# Patient Record
Sex: Female | Born: 1955 | Race: White | Hispanic: No | Smoking: Never smoker
Health system: Southern US, Community
[De-identification: ages and names within clinical notes are randomized; demographics above are authoritative.]

## PROBLEM LIST (undated history)

## (undated) DIAGNOSIS — J45909 Unspecified asthma, uncomplicated: Secondary | ICD-10-CM

## (undated) DIAGNOSIS — H04129 Dry eye syndrome of unspecified lacrimal gland: Secondary | ICD-10-CM

## (undated) DIAGNOSIS — H269 Unspecified cataract: Secondary | ICD-10-CM

## (undated) DIAGNOSIS — G629 Polyneuropathy, unspecified: Secondary | ICD-10-CM

## (undated) DIAGNOSIS — M810 Age-related osteoporosis without current pathological fracture: Secondary | ICD-10-CM

## (undated) DIAGNOSIS — I4891 Unspecified atrial fibrillation: Secondary | ICD-10-CM

## (undated) DIAGNOSIS — Z9989 Dependence on other enabling machines and devices: Secondary | ICD-10-CM

## (undated) DIAGNOSIS — K219 Gastro-esophageal reflux disease without esophagitis: Secondary | ICD-10-CM

## (undated) DIAGNOSIS — A6920 Lyme disease, unspecified: Secondary | ICD-10-CM

## (undated) DIAGNOSIS — T7840XA Allergy, unspecified, initial encounter: Secondary | ICD-10-CM

## (undated) DIAGNOSIS — G4730 Sleep apnea, unspecified: Secondary | ICD-10-CM

## (undated) DIAGNOSIS — G709 Myoneural disorder, unspecified: Secondary | ICD-10-CM

## (undated) DIAGNOSIS — R5383 Other fatigue: Secondary | ICD-10-CM

## (undated) DIAGNOSIS — F419 Anxiety disorder, unspecified: Secondary | ICD-10-CM

## (undated) DIAGNOSIS — M1990 Unspecified osteoarthritis, unspecified site: Secondary | ICD-10-CM

## (undated) HISTORY — DX: Unspecified osteoarthritis, unspecified site: M19.90

## (undated) HISTORY — DX: Sleep apnea, unspecified: G47.30

## (undated) HISTORY — DX: Lyme disease, unspecified: A69.20

## (undated) HISTORY — DX: Unspecified cataract: H26.9

## (undated) HISTORY — DX: Other fatigue: R53.83

## (undated) HISTORY — DX: Dependence on other enabling machines and devices: Z99.89

## (undated) HISTORY — DX: Unspecified asthma, uncomplicated: J45.909

## (undated) HISTORY — DX: Polyneuropathy, unspecified: G62.9

## (undated) HISTORY — DX: Dry eye syndrome of unspecified lacrimal gland: H04.129

## (undated) HISTORY — PX: RIGHT AND LEFT HEART CATH: CATH118262

## (undated) HISTORY — DX: Anxiety disorder, unspecified: F41.9

## (undated) HISTORY — DX: Unspecified atrial fibrillation: I48.91

## (undated) HISTORY — DX: Myoneural disorder, unspecified: G70.9

## (undated) HISTORY — DX: Gastro-esophageal reflux disease without esophagitis: K21.9

## (undated) HISTORY — DX: Allergy, unspecified, initial encounter: T78.40XA

## (undated) HISTORY — DX: Age-related osteoporosis without current pathological fracture: M81.0

## (undated) HISTORY — PX: ABDOMINAL HYSTERECTOMY: SHX81

## (undated) HISTORY — PX: EYE SURGERY: SHX253

## (undated) HISTORY — PX: TUBAL LIGATION: SHX77

## (undated) HISTORY — PX: ESOPHAGOGASTRODUODENOSCOPY: SHX1529

## (undated) HISTORY — PX: COLONOSCOPY: SHX174

## (undated) HISTORY — PX: BREAST SURGERY: SHX581

---

## 2016-05-06 DIAGNOSIS — I480 Paroxysmal atrial fibrillation: Secondary | ICD-10-CM | POA: Insufficient documentation

## 2021-10-13 ENCOUNTER — Encounter: Payer: Self-pay | Admitting: Nurse Practitioner

## 2021-10-13 ENCOUNTER — Ambulatory Visit (INDEPENDENT_AMBULATORY_CARE_PROVIDER_SITE_OTHER): Payer: Medicare Other | Admitting: Nurse Practitioner

## 2021-10-13 VITALS — BP 104/63 | HR 77 | Temp 98.0°F | Resp 20 | Wt 120.0 lb

## 2021-10-13 DIAGNOSIS — K2100 Gastro-esophageal reflux disease with esophagitis, without bleeding: Secondary | ICD-10-CM | POA: Diagnosis not present

## 2021-10-13 DIAGNOSIS — Z7689 Persons encountering health services in other specified circumstances: Secondary | ICD-10-CM | POA: Diagnosis not present

## 2021-10-13 DIAGNOSIS — M19042 Primary osteoarthritis, left hand: Secondary | ICD-10-CM

## 2021-10-13 DIAGNOSIS — J4520 Mild intermittent asthma, uncomplicated: Secondary | ICD-10-CM | POA: Diagnosis not present

## 2021-10-13 DIAGNOSIS — M19041 Primary osteoarthritis, right hand: Secondary | ICD-10-CM

## 2021-10-13 MED ORDER — MONTELUKAST SODIUM 10 MG PO TABS: 10.0000 mg | ORAL_TABLET | Freq: Every day | ORAL | 2 refills | Status: DC

## 2021-10-13 MED ORDER — PANTOPRAZOLE SODIUM 40 MG PO TBEC: 40.0000 mg | DELAYED_RELEASE_TABLET | Freq: Every day | ORAL | 2 refills | Status: DC

## 2021-10-13 NOTE — Assessment & Plan Note (Signed)
Patient is new establishing care today.  Recently moved down from [place].  Provided education on health maintenance and preventative care.  Patient will return for complete physical labs and mammogram in the next 3 months.  Referral to neurology and rheumatology per patient request completed.  Advised patient to fax or medical information and specialty areas to clinic.  Patient verbalized understanding.

## 2021-10-13 NOTE — Assessment & Plan Note (Signed)
Patient's asthma is well controlled on albuterol no changes necessary.  Rx refill sent to pharmacy.  Follow-up as needed.

## 2021-10-13 NOTE — Patient Instructions (Signed)
Asthma, Adult °Asthma is a long-term (chronic) condition that causes recurrent episodes in which the airways become tight and narrow. The airways are the passages that lead from the nose and mouth down into the lungs. Asthma episodes, also called asthma attacks, can cause coughing, wheezing, shortness of breath, and chest pain. The airways can also fill with mucus. During an attack, it can be difficult to breathe. Asthma attacks can range from minor to life threatening. °Asthma cannot be cured, but medicines and lifestyle changes can help control it and treat acute attacks. °What are the causes? °This condition is believed to be caused by inherited (genetic) and environmental factors, but its exact cause is not known. °There are many things that can bring on an asthma attack or make asthma symptoms worse (triggers). Asthma triggers are different for each person. Common triggers include: °Mold. °Dust. °Cigarette smoke. °Cockroaches. °Things that can cause allergy symptoms (allergens), such as animal dander or pollen from trees or grass. °Air pollutants such as household cleaners, wood smoke, smog, or chemical odors. °Cold air, weather changes, and winds (which increase molds and pollen in the air). °Strong emotional expressions such as crying or laughing hard. °Stress. °Certain medicines (such as aspirin) or types of medicines (such as beta-blockers). °Sulfites in foods and drinks. Foods and drinks that may contain sulfites include dried fruit, potato chips, and sparkling grape juice. °Infections or inflammatory conditions such as the flu, a cold, or inflammation of the nasal membranes (rhinitis). °Gastroesophageal reflux disease (GERD). °Exercise or strenuous activity. °What are the signs or symptoms? °Symptoms of this condition may occur right after asthma is triggered or many hours later. Symptoms include: °Wheezing. This can sound like whistling when you breathe. °Excessive nighttime or early morning  coughing. °Frequent or severe coughing with a common cold. °Chest tightness. °Shortness of breath. °Tiredness (fatigue) with minimal activity. °How is this diagnosed? °This condition is diagnosed based on: °Your medical history. °A physical exam. °Tests, which may include: °Lung function studies and pulmonary studies (spirometry). These tests can evaluate the flow of air in your lungs. °Allergy tests. °Imaging tests, such as X-rays. °How is this treated? °There is no cure for this condition, but treatment can help control your symptoms. Treatment for asthma usually involves: °Identifying and avoiding your asthma triggers. °Using medicines to control your symptoms. Generally, two types of medicines are used to treat asthma: °Controller medicines. These help prevent asthma symptoms from occurring. They are usually taken every day. °Fast-acting reliever or rescue medicines. These quickly relieve asthma symptoms by widening the narrow and tight airways. They are used as needed and provide short-term relief. °Using supplemental oxygen. This may be needed during a severe episode. °Using other medicines, such as: °Allergy medicines, such as antihistamines, if your asthma attacks are triggered by allergens. °Immune medicines (immunomodulators). These are medicines that help control the immune system. °Creating an asthma action plan. An asthma action plan is a written plan for managing and treating your asthma attacks. This plan includes: °A list of your asthma triggers and how to avoid them. °Information about when medicines should be taken and when their dosage should be changed. °Instructions about using a device called a peak flow meter. A peak flow meter measures how well the lungs are working and the severity of your asthma. It helps you monitor your condition. °Follow these instructions at home: °Controlling your home environment °Control your home environment in the following ways to help avoid triggers and prevent  asthma attacks: °Change your heating   and air conditioning filter regularly. °Limit your use of fireplaces and wood stoves. °Get rid of pests (such as roaches and mice) and their droppings. °Throw away plants if you see mold on them. °Clean floors and dust surfaces regularly. Use unscented cleaning products. °Try to have someone else vacuum for you regularly. Stay out of rooms while they are being vacuumed and for a short while afterward. If you vacuum, use a dust mask from a hardware store, a double-layered or microfilter vacuum cleaner bag, or a vacuum cleaner with a HEPA filter. °Replace carpet with wood, tile, or vinyl flooring. Carpet can trap dander and dust. °Use allergy-proof pillows, mattress covers, and box spring covers. °Keep your bedroom a trigger-free room. °Avoid pets and keep windows closed when allergens are in the air. °Wash beddings every week in hot water and dry them in a dryer. °Use blankets that are made of polyester or cotton. °Clean bathrooms and kitchens with bleach. If possible, have someone repaint the walls in these rooms with mold-resistant paint. Stay out of the rooms that are being cleaned and painted. °Wash your hands often with soap and water. If soap and water are not available, use hand sanitizer. °Do not allow anyone to smoke in your home. °General instructions °Take over-the-counter and prescription medicines only as told by your health care provider. °Speak with your health care provider if you have questions about how or when to take the medicines. °Make note if you are requiring more frequent dosages. °Do not use any products that contain nicotine or tobacco, such as cigarettes and e-cigarettes. If you need help quitting, ask your health care provider. Also, avoid being exposed to secondhand smoke. °Use a peak flow meter as told by your health care provider. Record and keep track of the readings. °Understand and use the asthma action plan to help minimize, or stop an asthma  attack, without needing to seek medical care. °Make sure you stay up to date on your yearly vaccinations as told by your health care provider. This may include vaccines for the flu and pneumonia. °Avoid outdoor activities when allergen counts are high and when air quality is low. °Wear a ski mask that covers your nose and mouth during outdoor winter activities. Exercise indoors on cold days if you can. °Warm up before exercising, and take time for a cool-down period after exercise. °Keep all follow-up visits as told by your health care provider. This is important. °Where to find more information °For information about asthma, turn to the Centers for Disease Control and Prevention at www.cdc.gov/asthma/faqs °For air quality information, turn to AirNow at airnow.gov °Contact a health care provider if: °You have wheezing, shortness of breath, or a cough even while you are taking medicine to prevent attacks. °The mucus you cough up (sputum) is thicker than usual. °Your sputum changes from clear or white to yellow, green, gray, or bloody. °Your medicines are causing side effects, such as a rash, itching, swelling, or trouble breathing. °You need to use a reliever medicine more than 2-3 times a week. °Your peak flow reading is still at 50-79% of your personal best after following your action plan for 1 hour. °You have a fever. °Get help right away if: °You are getting worse and do not respond to treatment during an asthma attack. °You are short of breath when at rest or when doing very little physical activity. °You have difficulty eating, drinking, or talking. °You have chest pain or tightness. °You develop a fast heartbeat or   palpitations. You have a bluish color to your lips or fingernails. You are light-headed or dizzy, or you faint. Your peak flow reading is less than 50% of your personal best. You feel too tired to breathe normally. Summary Asthma is a long-term (chronic) condition that causes recurrent  episodes in which the airways become tight and narrow. These episodes can cause coughing, wheezing, shortness of breath, and chest pain. Asthma cannot be cured, but medicines and lifestyle changes can help control it and treat acute attacks. Make sure you understand how to avoid triggers and how and when to use your medicines. Asthma attacks can range from minor to life threatening. Get help right away if you have an asthma attack and do not respond to treatment with your usual rescue medicines. This information is not intended to replace advice given to you by your health care provider. Make sure you discuss any questions you have with your health care provider. Document Revised: 07/19/2020 Document Reviewed: 02/21/2020 Elsevier Patient Education  [place]. Gastroesophageal Reflux Disease, Adult Gastroesophageal reflux (GER) happens when acid from the stomach flows up into the tube that connects the mouth and the stomach (esophagus). Normally, food travels down the esophagus and stays in the stomach to be digested. With GER, food and stomach acid sometimes move back up into the esophagus. You may have a disease called gastroesophageal reflux disease (GERD) if the reflux: Happens often. Causes frequent or very bad symptoms. Causes problems such as damage to the esophagus. When this happens, the esophagus becomes sore and swollen. Over time, GERD can make small holes (ulcers) in the lining of the esophagus. What are the causes? This condition is caused by a problem with the muscle between the esophagus and the stomach. When this muscle is weak or not normal, it does not close properly to keep food and acid from coming back up from the stomach. The muscle can be weak because of: Tobacco use. Pregnancy. Having a certain type of hernia (hiatal hernia). Alcohol use. Certain foods and drinks, such as coffee, chocolate, onions, and peppermint. What increases the risk? Being  overweight. Having a disease that affects your connective tissue. Taking NSAIDs, such a ibuprofen. What are the signs or symptoms? Heartburn. Difficult or painful swallowing. The feeling of having a lump in the throat. A bitter taste in the mouth. Bad breath. Having a lot of saliva. Having an upset or bloated stomach. Burping. Chest pain. Different conditions can cause chest pain. Make sure you see your doctor if you have chest pain. Shortness of breath or wheezing. A long-term cough or a cough at night. Wearing away of the surface of teeth (tooth enamel). Weight loss. How is this treated? Making changes to your diet. Taking medicine. Having surgery. Treatment will depend on how bad your symptoms are. Follow these instructions at home: Eating and drinking  Follow a diet as told by your doctor. You may need to avoid foods and drinks such as: Coffee and tea, with or without caffeine. Drinks that contain alcohol. Energy drinks and sports drinks. Bubbly (carbonated) drinks or sodas. Chocolate and cocoa. Peppermint and mint flavorings. Garlic and onions. Horseradish. Spicy and acidic foods. These include peppers, chili powder, curry powder, vinegar, hot sauces, and BBQ sauce. Citrus fruit juices and citrus fruits, such as oranges, lemons, and limes. Tomato-based foods. These include red sauce, chili, salsa, and pizza with red sauce. Fried and fatty foods. These include donuts, french fries, potato chips, and high-fat dressings. High-fat meats. These include  hot dogs, rib eye steak, sausage, ham, and bacon. High-fat dairy items, such as whole milk, butter, and cream cheese. Eat small meals often. Avoid eating large meals. Avoid drinking large amounts of liquid with your meals. Avoid eating meals during the 2-3 hours before bedtime. Avoid lying down right after you eat. Do not exercise right after you eat. Lifestyle  Do not smoke or use any products that contain nicotine or  tobacco. If you need help quitting, ask your doctor. Try to lower your stress. If you need help doing this, ask your doctor. If you are overweight, lose an amount of weight that is healthy for you. Ask your doctor about a safe weight loss goal. General instructions Pay attention to any changes in your symptoms. Take over-the-counter and prescription medicines only as told by your doctor. Do not take aspirin, ibuprofen, or other NSAIDs unless your doctor says it is okay. Wear loose clothes. Do not wear anything tight around your waist. Raise (elevate) the head of your bed about 6 inches (15 cm). You may need to use a wedge to do this. Avoid bending over if this makes your symptoms worse. Keep all follow-up visits. Contact a doctor if: You have new symptoms. You lose weight and you do not know why. You have trouble swallowing or it hurts to swallow. You have wheezing or a cough that keeps happening. You have a hoarse voice. Your symptoms do not get better with treatment. Get help right away if: You have sudden pain in your arms, neck, jaw, teeth, or back. You suddenly feel sweaty, dizzy, or light-headed. You have chest pain or shortness of breath. You vomit and the vomit is green, yellow, or black, or it looks like blood or coffee grounds. You faint. Your poop (stool) is red, bloody, or black. You cannot swallow, drink, or eat. These symptoms may represent a serious problem that is an emergency. Do not wait to see if the symptoms will go away. Get medical help right away. Call your local emergency services (911 in the [place] Do not drive yourself to the hospital. Summary If a person has gastroesophageal reflux disease (GERD), food and stomach acid move back up into the esophagus and cause symptoms or problems such as damage to the esophagus. Treatment will depend on how bad your symptoms are. Follow a diet as told by your doctor. Take all medicines only as told by your doctor. This  information is not intended to replace advice given to you by your health care provider. Make sure you discuss any questions you have with your health care provider. Document Revised: 04/29/2020 Document Reviewed: 04/29/2020 Elsevier Patient Education  [place].

## 2021-10-13 NOTE — Assessment & Plan Note (Signed)
GERD symptoms well managed on Protonix 40 mg tablet by mouth daily.  No changes to dose Rx refill sent to pharmacy.

## 2021-10-13 NOTE — Progress Notes (Signed)
New Patient Note  RE: Kristin Wallace MRN: [id number] DOB: 1956-05-02 Date of Office Visit: 10/13/2021  Chief Complaint: Establish Care (NEW //), Gastroesophageal Reflux, and Asthma  History of Present Illness:   GERD, Follow up:  The patient was last seen for GERD.  months ago. Changes made since that visit include Protonix 40 mg tablet by mouth daily..  She reports good compliance with treatment. She is not having side effects. .  She IS experiencing heartburn. She is NOT experiencing dysphagia, early satiety, or fullness after meals   Asthma Follow-up: She has previously been evaluated by a different physician in [place] for asthma and presents for an asthma follow-up; she is not currently in exacerbation. Observed precipitants include no identifiable factor.  Current limitations in activity from asthma: none.  Number of days of school or work missed in the last month: not applicable. Number of Emergency Department visits in the previous month: none. Frequency of use of quick-relief meds: Very infrequent. The patient reports adherence to this regimen   Assessment and Plan: Kristin Wallace is a 65 y.o. female with: Mild intermittent asthma without complication Patient's asthma is well controlled on albuterol no changes necessary.  Rx refill sent to pharmacy.  Follow-up as needed.  Gastroesophageal reflux disease with esophagitis without hemorrhage GERD symptoms well managed on Protonix 40 mg tablet by mouth daily.  No changes to dose Rx refill sent to pharmacy.  Establishing care with new doctor, encounter for Patient is new establishing care today.  Recently moved down from [place].  Provided education on health maintenance and preventative care.  Patient will return for complete physical labs and mammogram in the next 3 months.  Referral to neurology and rheumatology per patient request completed.  Advised patient to fax or medical information and specialty areas to clinic.  Patient  verbalized understanding.  Return in about 3 months (around 01/11/2022) for CPE, LABS, MM.   Diagnostics:   Past Medical History: Patient Active Problem List   Diagnosis Date Noted   Mild intermittent asthma without complication 66/44/0347   Gastroesophageal reflux disease with esophagitis without hemorrhage 10/13/2021   Establishing care with new doctor, encounter for 10/13/2021   Past Medical History:  Diagnosis Date   Arthritis    erosive osteoarthritis   Asthma    Atrial fibrillation (HCC)    Chronically dry eyes    Fatigue    chronic ( and weakness)   GERD (gastroesophageal reflux disease)    Neuromuscular disorder (HCC)    neuropathy   Neuropathy    legs and feet   Osteoporosis    Past Surgical History: Past Surgical History:  Procedure Laterality Date   ABDOMINAL HYSTERECTOMY     BREAST SURGERY Left    neg tumor   RIGHT AND LEFT HEART CATH     TUBAL LIGATION     Medication List:  Current Outpatient Medications  Medication Sig Dispense Refill   albuterol (VENTOLIN HFA) 108 (90 Base) MCG/ACT inhaler Inhale into the lungs every 6 (six) hours as needed for wheezing or shortness of breath.     budesonide-formoterol (SYMBICORT) 160-4.5 MCG/ACT inhaler Inhale 2 puffs into the lungs 2 (two) times daily.     cycloSPORINE (RESTASIS) 0.05 % ophthalmic emulsion 1 drop 2 (two) times daily.     hydroxychloroquine (PLAQUENIL) 200 MG tablet Take 200 mg by mouth 2 (two) times daily.     pregabalin (LYRICA) 75 MG capsule Take 75 mg by mouth 2 (two) times daily.     raloxifene (  EVISTA) 60 MG tablet Take 60 mg by mouth daily.     montelukast (SINGULAIR) 10 MG tablet Take 1 tablet (10 mg total) by mouth at bedtime. 30 tablet 2   pantoprazole (PROTONIX) 40 MG tablet Take 1 tablet (40 mg total) by mouth daily. 30 tablet 2   No current facility-administered medications for this visit.   Allergies: Allergies  Allergen Reactions   Shellfish Allergy     Vomiting    Social  History: Social History   Socioeconomic History   Marital status: Divorced    Spouse name: Not on file   Number of children: 3   Years of education: Not on file   Highest education level: Not on file  Occupational History   Not on file  Tobacco Use   Smoking status: Never   Smokeless tobacco: Never  Vaping Use   Vaping Use: Never used  Substance and Sexual Activity   Alcohol use: Never   Drug use: Never   Sexual activity: Not on file  Other Topics Concern   Not on file  Social History Narrative   Moved here from [place] this year 07/27/2021.   Social Determinants of Health   Financial Resource Strain: Not on file  Food Insecurity: Not on file  Transportation Needs: Not on file  Physical Activity: Not on file  Stress: Not on file  Social Connections: Not on file       Family History: Family History  Problem Relation Age of Onset   Depression Mother    Other Mother        suicide   Other Father        plane crash   Heart disease Sister    Cancer Brother    Cancer Brother        skin cancer   Thyroid disease Daughter    Migraines Daughter          Review of Systems  Constitutional: Negative.   HENT: Negative.    Eyes: Negative.   Respiratory: Negative.    Cardiovascular: Negative.   Gastrointestinal: Negative.   Genitourinary: Negative.   Musculoskeletal: Negative.   All other systems reviewed and are negative. Objective: BP 104/63   Pulse 77   Temp 98 F (36.7 C)   Resp 20   Wt 120 lb (54.4 kg)   LMP  (Exact Date)   SpO2 97%  There is no height or weight on file to calculate BMI. Physical Exam Vitals and nursing note reviewed.  Constitutional:      Appearance: Normal appearance.  HENT:     Head: Normocephalic.     Right Ear: External ear normal.     Left Ear: External ear normal.     Mouth/Throat:     Mouth: Mucous membranes are moist.     Pharynx: Oropharynx is clear.  Eyes:     Conjunctiva/sclera: Conjunctivae normal.   Cardiovascular:     Rate and Rhythm: Normal rate and regular rhythm.     Pulses: Normal pulses.     Heart sounds: Normal heart sounds.  Pulmonary:     Effort: Pulmonary effort is normal.     Breath sounds: Normal breath sounds.  Abdominal:     General: Bowel sounds are normal.  Musculoskeletal:        General: Normal range of motion.  Skin:    General: Skin is warm.     Findings: No rash.  Neurological:     Mental Status: She is alert.  Psychiatric:  Mood and Affect: Mood normal.        Behavior: Behavior normal.   The plan was reviewed with the patient/family, and all questions/concerned were addressed.  It was my pleasure to see Kristin Wallace today and participate in her care. Please feel free to contact me with any questions or concerns.  Sincerely,  Jac Canavan NP [place]

## 2021-10-23 ENCOUNTER — Telehealth: Payer: Self-pay | Admitting: Nurse Practitioner

## 2021-10-29 NOTE — Telephone Encounter (Signed)
Patient aware and verbalized understanding. °

## 2021-10-29 NOTE — Telephone Encounter (Signed)
Unfortunately no patient assistance for restasis

## 2021-10-31 ENCOUNTER — Other Ambulatory Visit: Payer: Self-pay | Admitting: Nurse Practitioner

## 2021-10-31 DIAGNOSIS — Z1231 Encounter for screening mammogram for malignant neoplasm of breast: Secondary | ICD-10-CM

## 2021-11-19 ENCOUNTER — Ambulatory Visit: Payer: Medicare Other

## 2021-11-24 ENCOUNTER — Telehealth: Payer: Self-pay | Admitting: Nurse Practitioner

## 2021-11-25 ENCOUNTER — Ambulatory Visit (INDEPENDENT_AMBULATORY_CARE_PROVIDER_SITE_OTHER): Payer: Medicare Other

## 2021-11-25 VITALS — Ht 66.5 in | Wt 123.0 lb

## 2021-11-25 DIAGNOSIS — Z599 Problem related to housing and economic circumstances, unspecified: Secondary | ICD-10-CM

## 2021-11-25 DIAGNOSIS — Z5941 Food insecurity: Secondary | ICD-10-CM

## 2021-11-25 DIAGNOSIS — Z0000 Encounter for general adult medical examination without abnormal findings: Secondary | ICD-10-CM

## 2021-11-25 NOTE — Patient Instructions (Signed)
Kristin Wallace , Thank you for taking time to come for your Medicare Wellness Visit. I appreciate your ongoing commitment to your health goals. Please review the following plan we discussed and let me know if I can assist you in the future.   Screening recommendations/referrals: Colonoscopy: done 12/02/2020 - Repeat in 5 years  Mammogram: Appointment 12/24/21 - Repeat annually  Bone Density: Done 2021 - Repeat every 2 years  Recommended yearly ophthalmology/optometry visit for glaucoma screening and checkup Recommended yearly dental visit for hygiene and checkup  Vaccinations: Influenza vaccine: Due every fall Pneumococcal vaccine: Done - we need dates Tdap vaccine: Done - we need dates Shingles vaccine: Done - we need dates   Covid-19: Done 11/13/2019, 12/11/2019, 09/06/2020, & 05/17/2021  Advanced directives: Please bring a copy of your health care power of attorney and living will to the office to be added to your chart at your convenience.   Conditions/risks identified: Aim for 30 minutes of exercise or brisk walking each day, drink 6-8 glasses of water and eat lots of fruits and vegetables.   Next appointment: Follow up in one year for your annual wellness visit    Preventive Care 65 Years and Older, Female Preventive care refers to lifestyle choices and visits with your health care provider that can promote health and wellness. What does preventive care include? A yearly physical exam. This is also called an annual well check. Dental exams once or twice a year. Routine eye exams. Ask your health care provider how often you should have your eyes checked. Personal lifestyle choices, including: Daily care of your teeth and gums. Regular physical activity. Eating a healthy diet. Avoiding tobacco and drug use. Limiting alcohol use. Practicing safe sex. Taking low-dose aspirin every day. Taking vitamin and mineral supplements as recommended by your health care provider. What happens  during an annual well check? The services and screenings done by your health care provider during your annual well check will depend on your age, overall health, lifestyle risk factors, and family history of disease. Counseling  Your health care provider may ask you questions about your: Alcohol use. Tobacco use. Drug use. Emotional well-being. Home and relationship well-being. Sexual activity. Eating habits. History of falls. Memory and ability to understand (cognition). Work and work Statistician. Reproductive health. Screening  You may have the following tests or measurements: Height, weight, and BMI. Blood pressure. Lipid and cholesterol levels. These may be checked every 5 years, or more frequently if you are over 40 years old. Skin check. Lung cancer screening. You may have this screening every year starting at age 70 if you have a 30-pack-year history of smoking and currently smoke or have quit within the past 15 years. Fecal occult blood test (FOBT) of the stool. You may have this test every year starting at age 71. Flexible sigmoidoscopy or colonoscopy. You may have a sigmoidoscopy every 5 years or a colonoscopy every 10 years starting at age 34. Hepatitis C blood test. Hepatitis B blood test. Sexually transmitted disease (STD) testing. Diabetes screening. This is done by checking your blood sugar (glucose) after you have not eaten for a while (fasting). You may have this done every 1-3 years. Bone density scan. This is done to screen for osteoporosis. You may have this done starting at age 24. Mammogram. This may be done every 1-2 years. Talk to your health care provider about how often you should have regular mammograms. Talk with your health care provider about your test results, treatment options,  and if necessary, the need for more tests. Vaccines  Your health care provider may recommend certain vaccines, such as: Influenza vaccine. This is recommended every  year. Tetanus, diphtheria, and acellular pertussis (Tdap, Td) vaccine. You may need a Td booster every 10 years. Zoster vaccine. You may need this after age 74. Pneumococcal 13-valent conjugate (PCV13) vaccine. One dose is recommended after age 59. Pneumococcal polysaccharide (PPSV23) vaccine. One dose is recommended after age 56. Talk to your health care provider about which screenings and vaccines you need and how often you need them. This information is not intended to replace advice given to you by your health care provider. Make sure you discuss any questions you have with your health care provider. Document Released: 11/15/2015 Document Revised: 07/08/2016 Document Reviewed: 08/20/2015 Elsevier Interactive Patient Education  2017 [employer] Prevention in the Home Falls can cause injuries. They can happen to people of all ages. There are many things you can do to make your home safe and to help prevent falls. What can I do on the outside of my home? Regularly fix the edges of walkways and driveways and fix any cracks. Remove anything that might make you trip as you walk through a door, such as a raised step or threshold. Trim any bushes or trees on the path to your home. Use bright outdoor lighting. Clear any walking paths of anything that might make someone trip, such as rocks or tools. Regularly check to see if handrails are loose or broken. Make sure that both sides of any steps have handrails. Any raised decks and porches should have guardrails on the edges. Have any leaves, snow, or ice cleared regularly. Use sand or salt on walking paths during winter. Clean up any spills in your garage right away. This includes oil or grease spills. What can I do in the bathroom? Use night lights. Install grab bars by the toilet and in the tub and shower. Do not use towel bars as grab bars. Use non-skid mats or decals in the tub or shower. If you need to sit down in the shower, use a  plastic, non-slip stool. Keep the floor dry. Clean up any water that spills on the floor as soon as it happens. Remove soap buildup in the tub or shower regularly. Attach bath mats securely with double-sided non-slip rug tape. Do not have throw rugs and other things on the floor that can make you trip. What can I do in the bedroom? Use night lights. Make sure that you have a light by your bed that is easy to reach. Do not use any sheets or blankets that are too big for your bed. They should not hang down onto the floor. Have a firm chair that has side arms. You can use this for support while you get dressed. Do not have throw rugs and other things on the floor that can make you trip. What can I do in the kitchen? Clean up any spills right away. Avoid walking on wet floors. Keep items that you use a lot in easy-to-reach places. If you need to reach something above you, use a strong step stool that has a grab bar. Keep electrical cords out of the way. Do not use floor polish or wax that makes floors slippery. If you must use wax, use non-skid floor wax. Do not have throw rugs and other things on the floor that can make you trip. What can I do with my stairs? Do not leave  any items on the stairs. Make sure that there are handrails on both sides of the stairs and use them. Fix handrails that are broken or loose. Make sure that handrails are as long as the stairways. Check any carpeting to make sure that it is firmly attached to the stairs. Fix any carpet that is loose or worn. Avoid having throw rugs at the top or bottom of the stairs. If you do have throw rugs, attach them to the floor with carpet tape. Make sure that you have a light switch at the top of the stairs and the bottom of the stairs. If you do not have them, ask someone to add them for you. What else can I do to help prevent falls? Wear shoes that: Do not have high heels. Have rubber bottoms. Are comfortable and fit you  well. Are closed at the toe. Do not wear sandals. If you use a stepladder: Make sure that it is fully opened. Do not climb a closed stepladder. Make sure that both sides of the stepladder are locked into place. Ask someone to hold it for you, if possible. Clearly mark and make sure that you can see: Any grab bars or handrails. First and last steps. Where the edge of each step is. Use tools that help you move around (mobility aids) if they are needed. These include: Canes. Walkers. Scooters. Crutches. Turn on the lights when you go into a dark area. Replace any light bulbs as soon as they burn out. Set up your furniture so you have a clear path. Avoid moving your furniture around. If any of your floors are uneven, fix them. If there are any pets around you, be aware of where they are. Review your medicines with your doctor. Some medicines can make you feel dizzy. This can increase your chance of falling. Ask your doctor what other things that you can do to help prevent falls. This information is not intended to replace advice given to you by your health care provider. Make sure you discuss any questions you have with your health care provider. Document Released: 08/15/2009 Document Revised: 03/26/2016 Document Reviewed: 11/23/2014 Elsevier Interactive Patient Education  2017 [employer].

## 2021-11-25 NOTE — Progress Notes (Signed)
Subjective:   Kristin Wallace is a 66 y.o. female who presents for an Initial Medicare Annual Wellness Visit.  Virtual Visit via Telephone Note  I connected with  Kristin Wallace on 11/25/21 at  3:30 PM EST by telephone and verified that I am speaking with the correct person using two identifiers.  Location: Patient: Home Provider: [facility] Persons participating in the virtual visit: patient/Nurse Health Advisor   I discussed the limitations, risks, security and privacy concerns of performing an evaluation and management service by telephone and the availability of in person appointments. The patient expressed understanding and agreed to proceed.  Interactive audio and video telecommunications were attempted between this nurse and patient, however failed, due to patient having technical difficulties OR patient did not have access to video capability.  We continued and completed visit with audio only.  Some vital signs may be absent or patient reported.   [name] E [name], LPN   Review of Systems     Cardiac Risk Factors include: advanced age (>72men, >41 women);sedentary lifestyle;Other (see comment), Risk factor comments: asthma     Objective:    Today's Vitals   11/25/21 1521  Weight: 123 lb (55.8 kg)  Height: 5' 6.5" (1.689 m)   Body mass index is 19.56 kg/m.  Advanced Directives 11/25/2021  Does Patient Have a Medical Advance Directive? Yes  Type of Advance Directive Early in Chart? Yes - validated most recent copy scanned in chart (See row information)    Current Medications (verified) Outpatient Encounter Medications as of 11/25/2021  Medication Sig   albuterol (VENTOLIN HFA) 108 (90 Base) MCG/ACT inhaler Inhale into the lungs every 6 (six) hours as needed for wheezing or shortness of breath.   budesonide-formoterol (SYMBICORT) 160-4.5 MCG/ACT inhaler Inhale 2 puffs into the lungs 2 (two) times daily.    cycloSPORINE (RESTASIS) 0.05 % ophthalmic emulsion 1 drop 2 (two) times daily.   hydroxychloroquine (PLAQUENIL) 200 MG tablet Take 200 mg by mouth 2 (two) times daily.   montelukast (SINGULAIR) 10 MG tablet Take 1 tablet (10 mg total) by mouth at bedtime.   pantoprazole (PROTONIX) 40 MG tablet Take 1 tablet (40 mg total) by mouth daily.   pregabalin (LYRICA) 75 MG capsule Take 75 mg by mouth 2 (two) times daily.   raloxifene (EVISTA) 60 MG tablet Take 60 mg by mouth daily.   No facility-administered encounter medications on file as of 11/25/2021.    Allergies (verified) Shellfish allergy   History: Past Medical History:  Diagnosis Date   Arthritis    erosive osteoarthritis   Asthma    Atrial fibrillation (HCC)    Chronically dry eyes    Fatigue    chronic ( and weakness)   GERD (gastroesophageal reflux disease)    Neuromuscular disorder (HCC)    neuropathy   Neuropathy    legs and feet   Osteoporosis    Past Surgical History:  Procedure Laterality Date   ABDOMINAL HYSTERECTOMY     BREAST SURGERY Left    neg tumor   RIGHT AND LEFT HEART CATH     TUBAL LIGATION     Family History  Problem Relation Age of Onset   Depression Mother    Other Mother        suicide   Other Father        plane crash   Heart disease Sister    Cancer Brother    Cancer Brother  skin cancer   Thyroid disease Daughter    Migraines Daughter    Social History   Socioeconomic History   Marital status: Divorced    Spouse name: Not on file   Number of children: 3   Years of education: Not on file   Highest education level: Not on file  Occupational History   Occupation: retired  Tobacco Use   Smoking status: Never   Smokeless tobacco: Never  Vaping Use   Vaping Use: Never used  Substance and Sexual Activity   Alcohol use: Never   Drug use: Never   Sexual activity: Not on file  Other Topics Concern   Not on file  Social History Narrative   Moved here from [place] this year  07/27/2021.   Daughter lives with her   Social Determinants of Health   Financial Resource Strain: Medium Risk   Difficulty of Paying Living Expenses: Somewhat hard  Food Insecurity: Food Insecurity Present   Worried About Charity fundraiser in the Last Year: Sometimes true   Arboriculturist in the Last Year: Never true  Transportation Needs: No Transportation Needs   Lack of Transportation (Medical): No   Lack of Transportation (Non-Medical): No  Physical Activity: Insufficiently Active   Days of Exercise per Week: 3 days   Minutes of Exercise per Session: 30 min  Stress: No Stress Concern Present   Feeling of Stress : Only a little  Social Connections: Moderately Integrated   Frequency of Communication with Friends and Family: More than three times a week   Frequency of Social Gatherings with Friends and Family: Once a week   Attends Religious Services: More than 4 times per year   Active Member of [employer] or Organizations: Yes   Attends Music therapist: More than 4 times per year   Marital Status: Divorced    Tobacco Counseling Counseling given: Not Answered   Clinical Intake:  Pre-visit preparation completed: Yes  Pain : No/denies pain     BMI - recorded: 19.56 Nutritional Status: BMI of 19-24  Normal Nutritional Risks: Nausea/ vomitting/ diarrhea (chronic issues - seeing GI) Diabetes: No  How often do you need to have someone help you when you read instructions, pamphlets, or other written materials from your doctor or pharmacy?: 1 - Never  Diabetic? no  Interpreter Needed?: No  Information entered by :: Carren Blakley, LPN   Activities of Daily Living In your present state of health, do you have any difficulty performing the following activities: 11/25/2021  Hearing? N  Vision? N  Difficulty concentrating or making decisions? Y  Comment intermittent - has trouble with names  Walking or climbing stairs? Y  Comment hurts joints  Dressing or  bathing? N  Doing errands, shopping? N  Preparing Food and eating ? N  Using the Toilet? N  In the past six months, have you accidently leaked urine? Y  Comment very mild - wears pantyliners  Do you have problems with loss of bowel control? N  Managing your Medications? N  Managing your Finances? N  Housekeeping or managing your Housekeeping? N  Some recent data might be hidden    Patient Care Team: Ivy Lynn, NP as PCP - General (Nurse Practitioner)  Indicate any recent Medical Services you may have received from other than [facility] providers in the past year (date may be approximate).     Assessment:   This is a routine wellness examination for [place].  Hearing/Vision screen Hearing  Screening - Comments:: Denies hearing difficulties  Vision Screening - Comments:: Wears rx glasses - up to date with annual eye exams, but hasn't gotten one here yet (recently moved here from [place])  Dietary issues and exercise activities discussed: Current Exercise Habits: Home exercise routine, Type of exercise: walking, Time (Minutes): 30, Frequency (Times/Week): 3, Weekly Exercise (Minutes/Week): 90, Intensity: Mild, Exercise limited by: orthopedic condition(s);respiratory conditions(s)   Goals Addressed             This Visit's Progress    Exercise 150 min/wk Moderate Activity         Depression Screen PHQ 2/9 Scores 11/25/2021 10/13/2021  PHQ - 2 Score 1 1  PHQ- 9 Score 4 6    Fall Risk Fall Risk  11/25/2021 10/13/2021  Falls in the past year? 0 0  Number falls in past yr: 0 -  Injury with Fall? 0 -  Risk for fall due to : Orthopedic patient -  Follow up Falls prevention discussed -    FALL [place]:  Any stairs in or around the home? No  If so, are there any without handrails? No  Home free of loose throw rugs in walkways, pet beds, electrical cords, etc? Yes  Adequate lighting in your home to reduce risk of falls? Yes   ASSISTIVE DEVICES  UTILIZED TO PREVENT FALLS:  Life alert? No  Use of a cane, walker or w/c? No  Grab bars in the bathroom? No  Shower chair or bench in shower? Yes  Elevated toilet seat or a handicapped toilet? No   TIMED UP AND GO:  Was the test performed? No . Telephonic visit  Cognitive Function:     6CIT Screen 11/25/2021  What Year? 0 points  What month? 0 points  What time? 0 points  Count back from 20 0 points  Months in reverse 2 points  Repeat phrase 2 points  Total Score 4    Immunizations  There is no immunization history on file for this patient.  TDAP status: Up to date  Flu Vaccine status: Up to date  Pneumococcal vaccine status: Up to date  Covid-19 vaccine status: Completed vaccines  Qualifies for Shingles Vaccine? Yes   Zostavax completed Yes   Shingrix Completed?: Yes  Screening Tests Health Maintenance  Topic Date Due   DEXA SCAN  Never done   COVID-19 Vaccine (1) Never done   Pneumonia Vaccine 55+ Years old (1 - PCV) Never done   MAMMOGRAM  Never done   Hepatitis C Screening  Never done   TETANUS/TDAP  Never done   Zoster Vaccines- Shingrix (1 of 2) Never done   INFLUENZA VACCINE  Never done   COLONOSCOPY (Pts 45-24yrs Insurance coverage will need to be confirmed)  12/02/2025   HPV VACCINES  Aged Out    Health Maintenance  Health Maintenance Due  Topic Date Due   DEXA SCAN  Never done   COVID-19 Vaccine (1) Never done   Pneumonia Vaccine 56+ Years old (1 - PCV) Never done   MAMMOGRAM  Never done   Hepatitis C Screening  Never done   TETANUS/TDAP  Never done   Zoster Vaccines- Shingrix (1 of 2) Never done   INFLUENZA VACCINE  Never done    Colorectal cancer screening: Type of screening: Colonoscopy. Completed 12/02/2020. Repeat every 5 years  Mammogram status: Ordered 11/2021. Pt provided with contact info and advised to call to schedule appt.   Bone Density status: Completed  2021. Results reflect: Bone density results: OSTEOPOROSIS. Repeat  every 2 years.  Lung Cancer Screening: (Low Dose CT Chest recommended if Age 31-80 years, 30 pack-year currently smoking OR have quit w/in 15years.) does not qualify  Additional Screening:  Hepatitis C Screening: does qualify; Due  Vision Screening: Recommended annual ophthalmology exams for early detection of glaucoma and other disorders of the eye. Is the patient up to date with their annual eye exam?  Yes  Who is the provider or what is the name of the office in which the patient attends annual eye exams? None here - last done in [place] If pt is not established with a provider, would they like to be referred to a provider to establish care? No .   Dental Screening: Recommended annual dental exams for proper oral hygiene  Community Resource Referral / Chronic Care Management: CRR required this visit?  Yes   CCM required this visit?  No      Plan:     I have personally reviewed and noted the following in the patients chart:   Medical and social history Use of alcohol, tobacco or illicit drugs  Current medications and supplements including opioid prescriptions. Patient is not currently taking opioid prescriptions. Functional ability and status Nutritional status Physical activity Advanced directives List of other physicians Hospitalizations, surgeries, and ER visits in previous 12 months Vitals Screenings to include cognitive, depression, and falls Referrals and appointments  In addition, I have reviewed and discussed with patient certain preventive protocols, quality metrics, and best practice recommendations. A written personalized care plan for preventive services as well as general preventive health recommendations were provided to patient.     Sandrea Hammond, LPN   3/38/3291   Nurse Notes: waiting for records from former PCP to update chart - per pt, she is UTD on screenings and vaccines CRR to assist with cost of basic needs, including food

## 2021-11-26 ENCOUNTER — Ambulatory Visit: Payer: Medicare Other

## 2021-12-01 ENCOUNTER — Telehealth: Payer: Self-pay | Admitting: *Deleted

## 2021-12-01 NOTE — Telephone Encounter (Signed)
° °  Telephone encounter was:  Unsuccessful.  12/01/2021 Name: Keshana Klemz MRN: [id number] DOB: 1956/08/24  Unsuccessful outbound call made today to assist with:  Food Insecurity  Outreach Attempt:  1st Attempt  A HIPAA compliant voice message was left requesting a return call.  Instructed patient to call back at   Instructed patient to call back at [phone]  at their earliest convenience. .  [place], Care Management  [phone] [address] , [place] [address] Email : [email or web address]

## 2021-12-02 NOTE — Progress Notes (Unsigned)
Office Visit Note  Patient: Kristin Wallace             Date of Birth: [birth year]           MRN: [id number]             PCP: Ivy Lynn, NP Referring: Ivy Lynn, NP Visit Date: 12/16/2021 Occupation: @GUAROCC @  Subjective:  No chief complaint on file.   History of Present Illness: Kristin Wallace is a 66 y.o. female ***   Activities of Daily Living:  Patient reports morning stiffness for *** {minute/hour:19697}.   Patient {ACTIONS;DENIES/REPORTS:21021675::"Denies"} nocturnal pain.  Difficulty dressing/grooming: {ACTIONS;DENIES/REPORTS:21021675::"Denies"} Difficulty climbing stairs: {ACTIONS;DENIES/REPORTS:21021675::"Denies"} Difficulty getting out of chair: {ACTIONS;DENIES/REPORTS:21021675::"Denies"} Difficulty using hands for taps, buttons, cutlery, and/or writing: {ACTIONS;DENIES/REPORTS:21021675::"Denies"}  No Rheumatology ROS completed.   PMFS History:  Patient Active Problem List   Diagnosis Date Noted   Mild intermittent asthma without complication 81/44/8185   Gastroesophageal reflux disease with esophagitis without hemorrhage 10/13/2021   Establishing care with new doctor, encounter for 10/13/2021    Past Medical History:  Diagnosis Date   Arthritis    erosive osteoarthritis   Asthma    Atrial fibrillation (HCC)    Chronically dry eyes    Fatigue    chronic ( and weakness)   GERD (gastroesophageal reflux disease)    Neuromuscular disorder (HCC)    neuropathy   Neuropathy    legs and feet   Osteoporosis     Family History  Problem Relation Age of Onset   Depression Mother    Other Mother        suicide   Other Father        plane crash   Heart disease Sister    Cancer Brother    Cancer Brother        skin cancer   Thyroid disease Daughter    Migraines Daughter    Past Surgical History:  Procedure Laterality Date   ABDOMINAL HYSTERECTOMY     BREAST SURGERY Left    neg tumor   RIGHT AND LEFT HEART CATH     TUBAL LIGATION      Social History   Social History Narrative   Moved here from [place] this year 07/27/2021.   Daughter lives with her    There is no immunization history on file for this patient.   Objective: Vital Signs: There were no vitals taken for this visit.   Physical Exam   Musculoskeletal Exam: ***  CDAI Exam: CDAI Score: -- Patient Global: --; Provider Global: -- Swollen: --; Tender: -- Joint Exam 12/16/2021   No joint exam has been documented for this visit   There is currently no information documented on the homunculus. Go to the Rheumatology activity and complete the homunculus joint exam.  Investigation: No additional findings.  Imaging: No results found.  Recent Labs: No results found for: WBC, HGB, PLT, NA, K, CL, CO2, GLUCOSE, BUN, CREATININE, BILITOT, ALKPHOS, AST, ALT, PROT, ALBUMIN, CALCIUM, GFRAA, QFTBGOLD, QFTBGOLDPLUS  Speciality Comments: No specialty comments available.  Procedures:  No procedures performed Allergies: Shellfish allergy   Assessment / Plan:     Visit Diagnoses: Pain in both hands  Orders: No orders of the defined types were placed in this encounter.  No orders of the defined types were placed in this encounter.   Face-to-face time spent with patient was *** minutes. Greater than 50% of time was spent in counseling and coordination of care.  Follow-Up Instructions: No follow-ups on file.  Ofilia Neas, PA-C  Note - This record has been created using Dragon software.  Chart creation errors have been sought, but may not always  have been located. Such creation errors do not reflect on  the standard of medical care.

## 2021-12-03 ENCOUNTER — Telehealth: Payer: Self-pay | Admitting: *Deleted

## 2021-12-03 NOTE — Telephone Encounter (Signed)
° °  Telephone encounter was:  Successful.  12/03/2021 Name: Kristin Wallace MRN: [id number] DOB: October 28, 1956  Kristin Wallace is a 66 y.o. year old female who is a primary care patient of Ivy Lynn, NP . The community resource team was consulted for assistance with patient moved here from [place] , has transportation , Will send via her email [email or web address] food pantries, senior reources congragate meals , [employer] , foodstamp applications and patient will reach out on receipt and if she needs further assistance   Care guide performed the following interventions: Patient provided with information about care guide support team and interviewed to confirm resource needs Follow up call placed to community resources to determine status of patients referral.  Follow Up Plan:  Client will return email upon receipt   [place], Care Management  [phone] [address] , [place] [address] Email : [email or web address]

## 2021-12-08 ENCOUNTER — Other Ambulatory Visit: Payer: Self-pay | Admitting: *Deleted

## 2021-12-10 MED ORDER — RALOXIFENE HCL 60 MG PO TABS: 60.0000 mg | ORAL_TABLET | Freq: Every day | ORAL | 1 refills | Status: DC

## 2021-12-11 ENCOUNTER — Telehealth: Payer: Self-pay | Admitting: *Deleted

## 2021-12-11 NOTE — Telephone Encounter (Signed)
° °  Telephone encounter was:  Unsuccessful.  12/11/2021 Name: Kristin Wallace MRN: [id number] DOB: 27-Jun-1956  Unsuccessful outbound call made today to assist with:  Transportation Needs , Food Insecurity, and Home Modifications  Outreach Attempt:  2nd Attempt  A HIPAA compliant voice message was left requesting a return call.  Instructed patient to call back at   Instructed patient to call back at [phone]  at their earliest convenience. .  [place], Care Management  [phone] [address] , [place] [address] Email : [email or web address]

## 2021-12-16 ENCOUNTER — Ambulatory Visit: Payer: Medicare Other | Admitting: Rheumatology

## 2021-12-16 DIAGNOSIS — M79641 Pain in right hand: Secondary | ICD-10-CM

## 2021-12-16 DIAGNOSIS — J4520 Mild intermittent asthma, uncomplicated: Secondary | ICD-10-CM

## 2021-12-16 DIAGNOSIS — K2100 Gastro-esophageal reflux disease with esophagitis, without bleeding: Secondary | ICD-10-CM

## 2021-12-17 ENCOUNTER — Telehealth: Payer: Self-pay | Admitting: *Deleted

## 2021-12-17 NOTE — Telephone Encounter (Signed)
° ° °  Telephone encounter was:  Unsuccessful.  12/17/2021 Name: Kristin Wallace MRN: [id number] DOB: 1956/04/03  Unsuccessful outbound call made today to assist with:  Transportation Needs  and Food Insecurity  Outreach Attempt:  3rd Attempt.  Referral closed unable to contact patient.  A HIPAA compliant voice message was left requesting a return call.  Instructed patient to call back at   Instructed patient to call back at [phone]  at their earliest convenience. .Left message again for patient to reach out if she had not received yet no answer left voicemail, [email or web address] referals have been acepted   [place], Care Management  [phone] [address] , [place] [address] Email : [email or web address]

## 2021-12-17 NOTE — Telephone Encounter (Signed)
° °  Telephone encounter was:  Successful.  12/17/2021 Name: Kristin Wallace MRN: [id number] DOB: Oct 25, 1956  Kristin Wallace is a 66 y.o. year old female who is a primary care patient of Ivy Lynn, NP . The community resource team was consulted for assistance with [place] [facility] [place] guide performed the following interventions: Patient provided with information about care guide support team and interviewed to confirm resource needs Follow up call placed to community resources to determine status of patients referral.Called to verify she received information and that all was good  Follow Up Plan:  No further follow up planned at this time. The patient has been provided with needed resources.  [facility], Care Management  [phone] [address] , [place] [address] Email : [email or web address]

## 2021-12-22 NOTE — Progress Notes (Signed)
Office Visit Note  Patient: Kristin Wallace             Date of Birth: 04-Dec-1955           MRN: [id number]             PCP: Ivy Lynn, NP Referring: Ivy Lynn, NP Visit Date: 12/23/2021   Subjective:  New Patient (Initial Visit) (Transfer of care, right middle finger pain and swelling)   History of Present Illness: Kristin Wallace is a 66 y.o. female here for erosive OA and osteoporosis. 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 She was prescribed hydroxychloroquine and treated with PIP joint steroid injection. Imaging of this previously looked consistent with erosive OA of the hands. She continues to have pain and stiffness worst in the right hand lasting much of the day. She takes lyrica more for leg neuropathy than joint pains which is somewhat helpful. For osteoporosis she started raloxifene due to need for ongoing dental procedures so avoided bisphosphonate treatment. She has some multiple toe fractures over the years and has mild neuropathy but no falls or fragility fractures.  Activities of Daily Living:  Patient reports morning stiffness for 12 hours.   Patient Reports nocturnal pain.  Difficulty dressing/grooming: Reports Difficulty climbing stairs: Reports Difficulty getting out of chair: Denies Difficulty using hands for taps, buttons, cutlery, and/or writing: Reports  Review of Systems  Constitutional:  Positive for fatigue.  HENT:  Positive for mouth dryness.   Eyes:  Positive for dryness.  Respiratory:  Positive for shortness of breath.   Cardiovascular:  Negative for swelling in legs/feet.  Gastrointestinal:  Positive for diarrhea.  Endocrine: Positive for cold intolerance and excessive thirst.  Genitourinary:  Negative for difficulty urinating.  Musculoskeletal:  Positive for joint pain, gait problem, joint pain, joint swelling, muscle weakness and  morning stiffness.  Skin:  Positive for nodules/bumps.  Allergic/Immunologic: Negative for susceptible to infections.  Neurological:  Positive for numbness and weakness.  Hematological:  Positive for bruising/bleeding tendency.  Psychiatric/Behavioral:  Positive for sleep disturbance.    PMFS History:  Patient Active Problem List   Diagnosis Date Noted   Annual physical exam 12/24/2021   Erosive osteoarthritis of both hands 12/23/2021   Hoarseness of voice 12/23/2021   Other fatigue 12/23/2021   Vitamin D deficiency 12/23/2021   Age-related osteoporosis without current pathological fracture 12/23/2021   Mild intermittent asthma without complication 67/89/3810   Gastroesophageal reflux disease with esophagitis without hemorrhage 10/13/2021   Establishing care with new doctor, encounter for 10/13/2021    Past Medical History:  Diagnosis Date   Arthritis    erosive osteoarthritis   Asthma    Atrial fibrillation (HCC)    Chronically dry eyes    Fatigue    chronic ( and weakness)   GERD (gastroesophageal reflux disease)    Neuromuscular disorder (HCC)    neuropathy   Neuropathy    legs and feet   Osteoporosis     Family History  Problem Relation Age of Onset   Depression Mother    Other Mother        suicide   Other Father        plane crash   Heart disease Sister    Cancer Brother    Cancer Brother        skin cancer   Thyroid disease Daughter    Migraines Daughter  Past Surgical History:  Procedure Laterality Date   ABDOMINAL HYSTERECTOMY     BREAST SURGERY Left    neg tumor   RIGHT AND LEFT HEART CATH     TUBAL LIGATION     Social History   Social History Narrative   Moved here from [place] this year 07/27/2021.   Daughter lives with her   Immunization History  Administered Date(s) Administered   Moderna SARS-COV2 Booster Vaccination 09/06/2020, 05/17/2021   Moderna Sars-Covid-2 Vaccination 11/13/2019, 12/11/2019     Objective: Vital Signs: BP  119/66 (BP Location: Right Arm, Patient Position: Sitting, Cuff Size: Normal)    Pulse 79    Resp 16    Ht 5\' 6"  (1.676 m)    Wt 118 lb (53.5 kg)    BMI 19.05 kg/m    Physical Exam Eyes:     Conjunctiva/sclera: Conjunctivae normal.  Cardiovascular:     Rate and Rhythm: Normal rate and regular rhythm.  Pulmonary:     Effort: Pulmonary effort is normal.     Breath sounds: Normal breath sounds.  Skin:    General: Skin is warm and dry.     Findings: No rash.     Comments: No digital pitting ulcers or skin peeling  Neurological:     Mental Status: She is alert.  Psychiatric:        Mood and Affect: Mood normal.     Musculoskeletal Exam:  Neck full ROM no tenderness Shoulders full ROM no tenderness or swelling Elbows full ROM no tenderness or swelling Wrists full ROM no tenderness or swelling Fingers right hand PIP worst joint enlargement with bony nodules and decreased ROM more severe in 3rd PIP next worst 2nd PIP, bilateral 1st CMC joint squaring and MCP hyperextension, worse on left hand Knees full ROM no tenderness or swelling Ankles full ROM no tenderness or swelling Negative MTP squeeze tenderness   Investigation: No additional findings.  Imaging: DG [facility] DEXA  Result Date: 12/24/2021 CLINICAL DATA:  Postmenopausal osteoporosis screening. EXAM: DUAL X-RAY ABSORPTIOMETRY (DXA) FOR BONE MINERAL DENSITY TECHNIQUE: Bone mineral density measurements are performed of the spine, hip, and forearm, as appropriate, per International Society of Clinical Densitometry recommendations. The pertinent regions of interest are reported below. Non-contributory values are not reported. Images are obtained for bone mineral density measurement and are not obtained for diagnostic purposes. FINDINGS: AP LUMBAR SPINE L1-L4 Bone Mineral Density (BMD):  0.781 g/cm2 Young Adult T-Score:  -3.3 Z-Score:  -1.4 Left FEMUR neck Bone Mineral Density (BMD):  0.872 g/cm2 Young Adult T-Score: -1.2 Z-Score:  0.6  Unit: This study was performed at [facility] on a [place]. Scan quality: The scan quality is good. Exclusions: None. ASSESSMENT: Patient's diagnostic category is OSTEOPOROSIS by WHO Criteria. FRACTURE RISK: INCREASED. FRAX:  Not reported due to T-score at or below -2.5. COMPARISON: None. RECOMMENDATIONS 1. All patients should optimize calcium and vitamin D intake. 2. Consider FDA-approved medical therapies in postmenopausal women and men aged 49 years and older, based on the following: - A hip or vertebral (clinical or morphometric) fracture - T-score less than or equal to -2.5 at the femoral neck or spine after appropriate evaluation to exclude secondary causes - Low bone mass (T-score between -1.0 and -2.5 at the femoral neck or spine) and a 10-year probability of a hip fracture greater than or equal to 3% or a 10-year probability of a major osteoporosis-related fracture greater than or equal to 20% based on the [place]-adapted [facility]  algorithm - Clinician judgment and/or patient preferences may indicate treatment for people with 10-year fracture probabilities above or below these levels 3. Patients with diagnosis of osteoporosis or at high risk for fracture should have regular bone mineral density tests. For patients eligible for Medicare, routine testing is allowed once every 2 years. The testing frequency can be increased to one year for patients who have rapidly progressing disease, those who are receiving or discontinuing medical therapy to restore bone mass, or have additional risk factors. Electronically Signed   By: Marin Olp M.D.   On: 12/24/2021 15:19   XR Hand 2 View Left  Result Date: 12/23/2021 X-ray left hand 2 views Radiocarpal joint space appears normal.  There is advanced degenerative changes and subluxation of the first [facility] joint.  MCP joint spaces appear normal.  There are mild degenerative arthritis affecting PIP and DIP joints no visible erosions.  Bone  mineralization suggest generalized osteopenia. Impression Osteoarthritis most severe in first [facility] joint with moderate distal joint involvement  XR Hand 2 View Right  Result Date: 12/23/2021 X-ray right hand 2 views Radiocarpal and carpal joint spaces appear normal.  Small osteophyte at first MCP joint.  Slight third MCP joint narrowing.  There is joint erosion and complete subluxation of the third PIP joint.  Large lateral osteophytes and early subluxation of the fourth PIP.  Complete joint fusion of the fourth DIP.  There are moderately advanced degenerative changes in other DIPs.  Bone mineralization suggests generalized osteopenia. Impression Chronic joint changes consistent with history of erosive osteoarthritis, could be consistent with psoriatic disease and right clinical picture   Recent Labs: Lab Results  Component Value Date   WBC 4.6 12/23/2021   HGB 13.1 12/23/2021   PLT 311 12/23/2021   NA 141 12/23/2021   K 4.3 12/23/2021   CL 105 12/23/2021   CO2 30 12/23/2021   GLUCOSE 91 12/23/2021   BUN 9 12/23/2021   CREATININE 0.57 12/23/2021   BILITOT 0.4 12/23/2021   AST 16 12/23/2021   ALT 14 12/23/2021   PROT 6.8 12/23/2021   CALCIUM 9.9 12/23/2021    Speciality Comments: No specialty comments available.  Procedures:  No procedures performed Allergies: Shellfish allergy   Assessment / Plan:     Visit Diagnoses: Erosive osteoarthritis of both hands - Plan: XR Hand 2 View Right, XR Hand 2 View Left, Sedimentation rate, C-reactive protein, CBC with Differential/Platelet, COMPLETE METABOLIC PANEL WITH GFR, hydroxychloroquine (PLAQUENIL) 200 MG tablet, Ambulatory referral to Ophthalmology  Findings are consistent on updated xrays today with previous diagnosis of erosive OA. I definitely do not see evidence of RA. Checking sed rate and CRP inflammation monitoring. Checking baseline CBC and metabolic panel for medication monitoring also with her chronic fatigue complaint. Referral  to ophthalmology to establish for hydroxychloroquine retinal toxicity monitoring. Plan to continue existing treatment HCQ 400 mg per day for now but will need to discuss dose modification or check serum level going forwards based on her weight.  Hoarseness of voice  Not clear cause, does not describe significant dysphagia or odynophagia. She does have some dryness mouth but eyes are worse.  Other fatigue - Plan: CBC with Differential/Platelet, COMPLETE METABOLIC PANEL WITH GFR  Vitamin D deficiency - Plan: VITAMIN D 25 Hydroxy (Vit-D Deficiency, Fractures) Age-related osteoporosis without current pathological fracture  Checking vitamin D level for adequate replacement. She continues raloxifene 60 mg daily no problems from this so far. She remains in need of additional future procedures.  Orders:  Orders Placed This Encounter  Procedures   XR Hand 2 View Right   XR Hand 2 View Left   Sedimentation rate   C-reactive protein   CBC with Differential/Platelet   COMPLETE METABOLIC PANEL WITH GFR   VITAMIN D 25 Hydroxy (Vit-D Deficiency, Fractures)   Ambulatory referral to Ophthalmology   Meds ordered this encounter  Medications   hydroxychloroquine (PLAQUENIL) 200 MG tablet    Sig: Take 1 tablet (200 mg total) by mouth 2 (two) times daily.    Dispense:  180 tablet    Refill:  1    Follow-Up Instructions: Return in about 3 months (around 03/22/2022) for Erosive OA/?Systemic symptoms f/u [facility]mos.   Collier Salina, MD  Note - This record has been created using [employer].  Chart creation errors have been sought, but may not always  have been located. Such creation errors do not reflect on  the standard of medical care.

## 2021-12-23 ENCOUNTER — Other Ambulatory Visit: Payer: Self-pay

## 2021-12-23 ENCOUNTER — Ambulatory Visit (INDEPENDENT_AMBULATORY_CARE_PROVIDER_SITE_OTHER): Payer: Medicare Other | Admitting: Internal Medicine

## 2021-12-23 ENCOUNTER — Ambulatory Visit: Payer: Medicare Other

## 2021-12-23 ENCOUNTER — Encounter: Payer: Self-pay | Admitting: Internal Medicine

## 2021-12-23 VITALS — BP 119/66 | HR 79 | Resp 16 | Ht 66.0 in | Wt 118.0 lb

## 2021-12-23 DIAGNOSIS — M79641 Pain in right hand: Secondary | ICD-10-CM

## 2021-12-23 DIAGNOSIS — M154 Erosive (osteo)arthritis: Secondary | ICD-10-CM | POA: Diagnosis not present

## 2021-12-23 DIAGNOSIS — R490 Dysphonia: Secondary | ICD-10-CM | POA: Insufficient documentation

## 2021-12-23 DIAGNOSIS — M810 Age-related osteoporosis without current pathological fracture: Secondary | ICD-10-CM

## 2021-12-23 DIAGNOSIS — M79642 Pain in left hand: Secondary | ICD-10-CM

## 2021-12-23 DIAGNOSIS — E559 Vitamin D deficiency, unspecified: Secondary | ICD-10-CM | POA: Diagnosis not present

## 2021-12-23 DIAGNOSIS — R5383 Other fatigue: Secondary | ICD-10-CM

## 2021-12-23 DIAGNOSIS — R531 Weakness: Secondary | ICD-10-CM | POA: Insufficient documentation

## 2021-12-23 MED ORDER — HYDROXYCHLOROQUINE SULFATE 200 MG PO TABS: 200.0000 mg | ORAL_TABLET | Freq: Two times a day (BID) | ORAL | 1 refills | Status: DC

## 2021-12-23 NOTE — Patient Instructions (Signed)

## 2021-12-24 ENCOUNTER — Ambulatory Visit (INDEPENDENT_AMBULATORY_CARE_PROVIDER_SITE_OTHER): Payer: Medicare Other

## 2021-12-24 ENCOUNTER — Ambulatory Visit (INDEPENDENT_AMBULATORY_CARE_PROVIDER_SITE_OTHER): Payer: Medicare Other | Admitting: Nurse Practitioner

## 2021-12-24 ENCOUNTER — Encounter: Payer: Self-pay | Admitting: Nurse Practitioner

## 2021-12-24 ENCOUNTER — Ambulatory Visit
Admission: RE | Admit: 2021-12-24 | Discharge: 2021-12-24 | Disposition: A | Discharge status: Home / Self Care | Payer: Medicare Other | Source: Ambulatory Visit | Attending: Nurse Practitioner | Admitting: Nurse Practitioner

## 2021-12-24 VITALS — BP 101/58 | HR 94 | Temp 98.3°F | Ht 66.0 in | Wt 120.8 lb

## 2021-12-24 DIAGNOSIS — M818 Other osteoporosis without current pathological fracture: Secondary | ICD-10-CM | POA: Diagnosis not present

## 2021-12-24 DIAGNOSIS — J4520 Mild intermittent asthma, uncomplicated: Secondary | ICD-10-CM | POA: Diagnosis not present

## 2021-12-24 DIAGNOSIS — Z780 Asymptomatic menopausal state: Secondary | ICD-10-CM

## 2021-12-24 DIAGNOSIS — Z1231 Encounter for screening mammogram for malignant neoplasm of breast: Secondary | ICD-10-CM

## 2021-12-24 DIAGNOSIS — Z0000 Encounter for general adult medical examination without abnormal findings: Secondary | ICD-10-CM | POA: Insufficient documentation

## 2021-12-24 DIAGNOSIS — Z139 Encounter for screening, unspecified: Secondary | ICD-10-CM

## 2021-12-24 LAB — CBC WITH DIFFERENTIAL/PLATELET
Absolute Monocytes: 506 cells/uL (ref 200–950)
Basophils Absolute: 41 cells/uL (ref 0–200)
Basophils Relative: 0.9 %
Eosinophils Absolute: 101 cells/uL (ref 15–500)
Eosinophils Relative: 2.2 %
HCT: 39.5 % (ref 35.0–45.0)
Hemoglobin: 13.1 g/dL (ref 11.7–15.5)
Lymphs Abs: 1302 cells/uL (ref 850–3900)
MCH: 30 pg (ref 27.0–33.0)
MCHC: 33.2 g/dL (ref 32.0–36.0)
MCV: 90.4 fL (ref 80.0–100.0)
MPV: 9.3 fL (ref 7.5–12.5)
Monocytes Relative: 11 %
Neutro Abs: 2650 cells/uL (ref 1500–7800)
Neutrophils Relative %: 57.6 %
Platelets: 311 10*3/uL (ref 140–400)
RBC: 4.37 10*6/uL (ref 3.80–5.10)
RDW: 12.7 % (ref 11.0–15.0)
Total Lymphocyte: 28.3 %
WBC: 4.6 10*3/uL (ref 3.8–10.8)

## 2021-12-24 LAB — COMPLETE METABOLIC PANEL WITH GFR
AG Ratio: 1.6 (calc) (ref 1.0–2.5)
ALT: 14 U/L (ref 6–29)
AST: 16 U/L (ref 10–35)
Albumin: 4.2 g/dL (ref 3.6–5.1)
Alkaline phosphatase (APISO): 48 U/L (ref 37–153)
BUN: 9 mg/dL (ref 7–25)
CO2: 30 mmol/L (ref 20–32)
Calcium: 9.9 mg/dL (ref 8.6–10.4)
Chloride: 105 mmol/L (ref 98–110)
Creat: 0.57 mg/dL (ref 0.50–1.05)
Globulin: 2.6 g/dL (calc) (ref 1.9–3.7)
Glucose, Bld: 91 mg/dL (ref 65–99)
Potassium: 4.3 mmol/L (ref 3.5–5.3)
Sodium: 141 mmol/L (ref 135–146)
Total Bilirubin: 0.4 mg/dL (ref 0.2–1.2)
Total Protein: 6.8 g/dL (ref 6.1–8.1)
eGFR: 100 mL/min/{1.73_m2} (ref >=60)

## 2021-12-24 LAB — SEDIMENTATION RATE: Sed Rate: 2 mm/h (ref 0–30)

## 2021-12-24 LAB — C-REACTIVE PROTEIN: CRP: 0.5 mg/L (ref <8.0)

## 2021-12-24 LAB — VITAMIN D 25 HYDROXY (VIT D DEFICIENCY, FRACTURES): Vit D, 25-Hydroxy: 70 ng/mL (ref 30–100)

## 2021-12-24 NOTE — Patient Instructions (Signed)

## 2021-12-24 NOTE — Progress Notes (Signed)
Established Patient Office Visit  Subjective:  Patient ID: Kristin Wallace, female    DOB: Aug 29, 1956  Age: 66 y.o. MRN: [id number]  CC:  Chief Complaint  Patient presents with   Annual Exam    HPI Kristin Wallace presents for .   Encounter for general adult medical examination without abnormal findings  Physical: Patient's last physical exam was 1 year ago .  Weight: Appropriate for height (BMI less than 27%) ;  Blood Pressure: Normal (BP less than 120/80) ;  Medical History: Patient history reviewed ; Family history reviewed ;  Allergies Reviewed: No change in current allergies ;  Medications Reviewed: Medications reviewed - no changes ;  Lipids: Normal lipid levels ; labs completed results pending Smoking: Life-long non-smoker ;  Physical Activity: Exercises at least 3 times per week ; No Alcohol/Drug Use: Is a non-drinker ; No illicit drug use ;  Patient is not afflicted from Stress Incontinence and Urge Incontinence  Safety: reviewed ; Patient wears a seat belt, has smoke detectors, has carbon monoxide detectors, practices appropriate gun safety: patient doesn't have guns. and wears sunscreen with extended sun exposure.No Dental Care: annual cleanings, brushes and flosses daily. Ophthalmology/Optometry: Annual visit.  Hearing loss: gradual hearing problems Vision impairments: wears prescription glasses   Past Medical History:  Diagnosis Date   Arthritis    erosive osteoarthritis   Asthma    Atrial fibrillation (HCC)    Chronically dry eyes    Fatigue    chronic ( and weakness)   GERD (gastroesophageal reflux disease)    Neuromuscular disorder (HCC)    neuropathy   Neuropathy    legs and feet   Osteoporosis     Past Surgical History:  Procedure Laterality Date   ABDOMINAL HYSTERECTOMY     BREAST SURGERY Left    neg tumor   RIGHT AND LEFT HEART CATH     TUBAL LIGATION      Family History  Problem Relation Age of Onset   Depression Mother    Other Mother         suicide   Other Father        plane crash   Heart disease Sister    Cancer Brother    Cancer Brother        skin cancer   Thyroid disease Daughter    Migraines Daughter     Social History   Socioeconomic History   Marital status: Divorced    Spouse name: Not on file   Number of children: 3   Years of education: Not on file   Highest education level: Not on file  Occupational History   Occupation: retired  Tobacco Use   Smoking status: Never   Smokeless tobacco: Never  Vaping Use   Vaping Use: Never used  Substance and Sexual Activity   Alcohol use: Yes    Comment: 3 yearly   Drug use: Never   Sexual activity: Not on file  Other Topics Concern   Not on file  Social History Narrative   Moved here from [place] this year 07/27/2021.   Daughter lives with her   Social Determinants of Health   Financial Resource Strain: Medium Risk   Difficulty of Paying Living Expenses: Somewhat hard  Food Insecurity: Food Insecurity Present   Worried About Charity fundraiser in the Last Year: Often true   Arboriculturist in the Last Year: Sometimes true  Transportation Needs: No Transportation Needs   Lack of Transportation (  Medical): No   Lack of Transportation (Non-Medical): No  Physical Activity: Insufficiently Active   Days of Exercise per Week: 3 days   Minutes of Exercise per Session: 30 min  Stress: No Stress Concern Present   Feeling of Stress : Only a little  Social Connections: Moderately Integrated   Frequency of Communication with Friends and Family: More than three times a week   Frequency of Social Gatherings with Friends and Family: Once a week   Attends Religious Services: More than 4 times per year   Active Member of [employer] or Organizations: Yes   Attends Music therapist: More than 4 times per year   Marital Status: Divorced  Human resources officer Violence: Not At Risk   Fear of Current or Ex-Partner: No   Emotionally Abused: No   Physically  Abused: No   Sexually Abused: No    Outpatient Medications Prior to Visit  Medication Sig Dispense Refill   albuterol (VENTOLIN HFA) 108 (90 Base) MCG/ACT inhaler Inhale into the lungs every 6 (six) hours as needed for wheezing or shortness of breath.     budesonide-formoterol (SYMBICORT) 160-4.5 MCG/ACT inhaler Inhale 2 puffs into the lungs 2 (two) times daily.     cycloSPORINE (RESTASIS) 0.05 % ophthalmic emulsion 1 drop 2 (two) times daily.     hydroxychloroquine (PLAQUENIL) 200 MG tablet Take 1 tablet (200 mg total) by mouth 2 (two) times daily. 180 tablet 1   montelukast (SINGULAIR) 10 MG tablet Take 1 tablet (10 mg total) by mouth at bedtime. 30 tablet 2   pantoprazole (PROTONIX) 40 MG tablet Take 1 tablet (40 mg total) by mouth daily. 30 tablet 2   pregabalin (LYRICA) 75 MG capsule Take 75 mg by mouth 2 (two) times daily.     raloxifene (EVISTA) 60 MG tablet Take 1 tablet (60 mg total) by mouth daily. 30 tablet 1   No facility-administered medications prior to visit.    Allergies  Allergen Reactions   Shellfish Allergy     Vomiting     ROS Review of Systems  Constitutional: Negative.   HENT: Negative.    Eyes: Negative.   Respiratory: Negative.    Cardiovascular: Negative.   Gastrointestinal: Negative.   Genitourinary: Negative.   Musculoskeletal: Negative.   Skin: Negative.  Negative for rash.  Psychiatric/Behavioral: Negative.    All other systems reviewed and are negative.    Objective:    Physical Exam Vitals and nursing note reviewed.  Constitutional:      Appearance: Normal appearance. She is normal weight.  HENT:     Head: Normocephalic.     Right Ear: Ear canal and external ear normal.     Left Ear: Ear canal and external ear normal.     Nose: Nose normal.     Mouth/Throat:     Mouth: Mucous membranes are moist.     Pharynx: Oropharynx is clear.  Eyes:     Conjunctiva/sclera: Conjunctivae normal.     Pupils: Pupils are equal, round, and reactive  to light.  Cardiovascular:     Rate and Rhythm: Normal rate and regular rhythm.     Pulses: Normal pulses.     Heart sounds: Normal heart sounds.  Pulmonary:     Effort: Pulmonary effort is normal.     Breath sounds: Normal breath sounds.  Abdominal:     General: Abdomen is flat. Bowel sounds are normal.  Musculoskeletal:     Cervical back: Normal range of motion.  Skin:  General: Skin is warm.     Findings: No rash.  Neurological:     General: No focal deficit present.     Mental Status: She is alert and oriented to person, place, and time.  Psychiatric:        Mood and Affect: Mood normal.        Behavior: Behavior normal.    BP (!) 101/58    Pulse 94    Temp 98.3 F (36.8 C) (Temporal)    Ht $R'5\' 6"'Xb$  (1.676 m)    Wt 120 lb 12.8 oz (54.8 kg)    SpO2 97%    BMI 19.50 kg/m  Wt Readings from Last 3 Encounters:  12/24/21 120 lb 12.8 oz (54.8 kg)  12/23/21 118 lb (53.5 kg)  11/25/21 123 lb (55.8 kg)     Health Maintenance Due  Topic Date Due   DEXA SCAN  Never done   MAMMOGRAM  Never done   Hepatitis C Screening  Never done    There are no preventive care reminders to display for this patient.  No results found for: TSH Lab Results  Component Value Date   WBC 4.6 12/23/2021   HGB 13.1 12/23/2021   HCT 39.5 12/23/2021   MCV 90.4 12/23/2021   PLT 311 12/23/2021   Lab Results  Component Value Date   NA 141 12/23/2021   K 4.3 12/23/2021   CO2 30 12/23/2021   GLUCOSE 91 12/23/2021   BUN 9 12/23/2021   CREATININE 0.57 12/23/2021   BILITOT 0.4 12/23/2021   AST 16 12/23/2021   ALT 14 12/23/2021   PROT 6.8 12/23/2021   CALCIUM 9.9 12/23/2021   EGFR 100 12/23/2021       Assessment & Plan:   Problem List Items Addressed This Visit       Respiratory   Mild intermittent asthma without complication    Patient is well controlled on current medication, referral requested by patient to go to pulmonology for reassessment. Referral completed.      Relevant  Orders   Ambulatory referral to Pulmonology     Other   Annual physical exam - Primary    Annual physical exam completed with education provided to disease management and prevention. completed labs: Lipid, TSH, DEXA with results pending. Mammogram scheduled for today.  Patient knows to follow up in 1 year.      Relevant Orders   Hepatitis C antibody   Lipid Panel   TSH   Other Visit Diagnoses     Encounter for health-related screening       Other osteoporosis without current pathological fracture       Relevant Orders   DG [facility] DEXA       No orders of the defined types were placed in this encounter.   Follow-up: Return in about 1 year (around 12/24/2022) for annual physical .    Ivy Lynn, NP

## 2021-12-24 NOTE — Assessment & Plan Note (Signed)
Patient is well controlled on current medication, referral requested by patient to go to pulmonology for reassessment. Referral completed.

## 2021-12-24 NOTE — Assessment & Plan Note (Signed)
Annual physical exam completed with education provided to disease management and prevention. completed labs: Lipid, TSH, DEXA with results pending. Mammogram scheduled for today.  Patient knows to follow up in 1 year.

## 2021-12-25 ENCOUNTER — Other Ambulatory Visit: Payer: Self-pay | Admitting: Nurse Practitioner

## 2021-12-25 DIAGNOSIS — J4520 Mild intermittent asthma, uncomplicated: Secondary | ICD-10-CM

## 2021-12-25 DIAGNOSIS — K2100 Gastro-esophageal reflux disease with esophagitis, without bleeding: Secondary | ICD-10-CM

## 2021-12-25 LAB — LIPID PANEL
Chol/HDL Ratio: 2.4 ratio (ref 0.0–4.4)
Cholesterol, Total: 149 mg/dL (ref 100–199)
HDL: 61 mg/dL (ref >39)
LDL Chol Calc (NIH): 73 mg/dL (ref 0–99)
Triglycerides: 81 mg/dL (ref 0–149)
VLDL Cholesterol Cal: 15 mg/dL (ref 5–40)

## 2021-12-25 LAB — TSH: TSH: 1.75 u[IU]/mL (ref 0.450–4.500)

## 2021-12-25 LAB — HEPATITIS C ANTIBODY: Hep C Virus Ab: NONREACTIVE

## 2021-12-30 ENCOUNTER — Telehealth: Payer: Self-pay | Admitting: Nurse Practitioner

## 2021-12-30 ENCOUNTER — Other Ambulatory Visit: Payer: Self-pay | Admitting: Nurse Practitioner

## 2021-12-30 DIAGNOSIS — R928 Other abnormal and inconclusive findings on diagnostic imaging of breast: Secondary | ICD-10-CM

## 2021-12-30 NOTE — Telephone Encounter (Signed)
Pt called regarding her referral to have [place] for mammogram.  Says she lives in [place] and would like her referral order sent to the breast center near the hospital in [place].   Please call patient with update/scheduling of this.

## 2021-12-31 ENCOUNTER — Other Ambulatory Visit: Payer: Self-pay | Admitting: Nurse Practitioner

## 2022-01-01 ENCOUNTER — Telehealth: Payer: Self-pay | Admitting: Nurse Practitioner

## 2022-01-01 MED ORDER — CALCIUM GLUCONATE 500 MG PO TABS: 2.0000 | ORAL_TABLET | Freq: Every day | ORAL | 1 refills | Status: DC

## 2022-01-01 NOTE — Telephone Encounter (Signed)
Patient aware and verbalized understanding. °

## 2022-01-01 NOTE — Telephone Encounter (Signed)
Prescription sent to pharmacy.

## 2022-01-05 ENCOUNTER — Encounter: Payer: Self-pay | Admitting: Internal Medicine

## 2022-01-05 ENCOUNTER — Other Ambulatory Visit: Payer: Self-pay

## 2022-01-05 ENCOUNTER — Ambulatory Visit (INDEPENDENT_AMBULATORY_CARE_PROVIDER_SITE_OTHER): Payer: Medicare Other | Admitting: Internal Medicine

## 2022-01-05 ENCOUNTER — Ambulatory Visit (INDEPENDENT_AMBULATORY_CARE_PROVIDER_SITE_OTHER): Payer: Medicare Other

## 2022-01-05 ENCOUNTER — Telehealth: Payer: Self-pay | Admitting: Nurse Practitioner

## 2022-01-05 DIAGNOSIS — J4520 Mild intermittent asthma, uncomplicated: Secondary | ICD-10-CM | POA: Diagnosis not present

## 2022-01-05 DIAGNOSIS — R490 Dysphonia: Secondary | ICD-10-CM | POA: Diagnosis not present

## 2022-01-05 NOTE — Progress Notes (Signed)
? ?Kristin Wallace, female    DOB: 1956-06-02,   MRN: [id number] ? ? 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  ? ? ? ?History of Present Illness  ?01/05/2022  Pulmonary/ 1st office eval/Demetries Coia  ?Chief Complaint  ?Patient presents with  ? Pulmonary Consult  ?  Referred by [name], NP for eval of Asthma. She moved to [facility] from [place] in Sept 2022- needs to est with pulm for Asthma. She states her voice is hoarse and has dry cough.   ?Dyspnea:  walks with cane limited by weakness / dysequilibrium  ?Cough: minimal dry cough  ?Sleep: able to lie flat bed on side with one pillow ?SABA use: rarely  ? ?No obvious day to day or daytime variability or assoc excess/ purulent sputum or mucus plugs or hemoptysis or cp or chest tightness, subjective wheeze or overt sinus or hb symptoms.  ? ?Sleeping  without nocturnal  or early am exacerbation  of respiratory  c/o's or need for noct saba. Also denies any obvious fluctuation of symptoms with weather or environmental changes or other aggravating or alleviating factors except as outlined above  ? ?No unusual exposure hx or h/o childhood pna/ asthma or knowledge of premature birth. ? ?Current Allergies, Complete Past Medical History, Past Surgical History, Family History, and Social History were reviewed in [employer] record. ? ?ROS  The following are not active complaints unless bolded ?Hoarseness, sore throat, dysphagia, dental problems, itching, sneezing,  nasal congestion or discharge of excess mucus or purulent secretions, ear ache,   fever, chills,  sweats, unintended wt loss or wt gain, classically pleuritic or exertional cp,  orthopnea pnd or arm/hand swelling  or leg swelling, presyncope, palpitations, abdominal pain, anorexia, nausea, vomiting, diarrhea  or change in bowel habits or change in bladder habits, change in stools or change in urine, dysuria, hematuria,  rash, arthralgias, visual complaints, headache, numbness, weakness or ataxia or problems with walking or coordination,  change in mood or  memory. ?      ?   ? ?Past Medical History:  ?Diagnosis Date  ? Arthritis   ? erosive osteoarthritis  ? Asthma   ? Atrial fibrillation ([place])   ? Chronically dry eyes   ? Fatigue   ? chronic ( and weakness)  ? GERD (gastroesophageal reflux disease)   ? Neuromuscular disorder ([place])   ? neuropathy  ? Neuropathy   ? legs and feet  ? Osteoporosis   ? ? ?Outpatient Medications Prior to Visit  ?Medication Sig Dispense Refill  ? albuterol (VENTOLIN HFA) 108 (90 Base) MCG/ACT inhaler Inhale into the lungs every 6 (six) hours as needed for wheezing or shortness of breath.    ? Ascorbic Acid (VITAMIN C) 1000 MG tablet Take 1,000 mg by mouth in the morning and at bedtime.    ? budesonide-formoterol (SYMBICORT) 160-4.5 MCG/ACT inhaler Inhale 2 puffs into the lungs 2 (two) times daily.    ? calcium gluconate 500 MG tablet Take 2 tablets (1,000 mg total) by mouth daily. 180 tablet 1  ? cycloSPORINE (RESTASIS)  0.05 % ophthalmic emulsion 1 drop 2 (two) times daily.    ? hydroxychloroquine (PLAQUENIL) 200 MG tablet Take 1 tablet (200 mg total) by mouth 2 (two) times daily. 180 tablet 1  ? montelukast (SINGULAIR) 10 MG tablet Take 1 tablet (10 mg total) by mouth at bedtime. 30 tablet 2  ? Multiple Vitamin (MULTIVITAMIN) tablet Take 1 tablet by mouth daily.    ? pantoprazole (PROTONIX) 40 MG tablet Take 1 tablet by mouth once daily 90 tablet 0  ? pregabalin (LYRICA) 75 MG capsule Take 75 mg by mouth 2 (two) times daily.    ? raloxifene (EVISTA) 60 MG tablet Take 1 tablet (60 mg  total) by mouth daily. 30 tablet 1  ? TURMERIC CURCUMIN PO Take 1 capsule by mouth in the morning and at bedtime.    ? vitamin B-12 (CYANOCOBALAMIN) 500 MCG tablet Take 500 mcg by mouth daily.    ? VITAMIN D PO Take 1 capsule by mouth daily.    ? ?No facility-administered medications prior to visit.  ? ? ? ?Objective:  ?  ? ?BP 104/60 (BP Location: Left Arm, Cuff Size: Normal)   Pulse 77   Temp 98.4 ?F (36.9 ?C) (Oral)   Ht 5' 5.75" (1.67 m)   Wt 121 lb (54.9 kg)   SpO2 97% Comment: on RA  BMI 19.68 kg/m?  ? ?SpO2: 97 % (on RA) ? ?Somber wf  ? ? HEENT : pt wearing mask not removed for exam due to covid -19 concerns.  ? ? ?NECK :  without JVD/Nodes/TM/ nl carotid upstrokes bilaterally ? ? ?LUNGS: no acc muscle use,  Nl contour chest which is clear to A and P bilaterally without cough on insp or exp maneuvers ? ? ?CV:  RRR  no s3 or murmur or increase in P2, and no edema  ? ?ABD:  soft and nontender with nl inspiratory excursion in the supine position. No bruits or organomegaly appreciated, bowel sounds nl ? ?MS:  Nl gait/ ext warm without deformities, calf tenderness, cyanosis or clubbing ?No obvious joint restrictions  ? ?SKIN: warm and dry without lesions   ? ?NEURO:  alert, approp, nl sensorium with  no motor or cerebellar deficits apparent.   ? ? ?CXR PA and Lateral:   01/05/2022 :    ?I personally reviewed images and agree with radiology impression as follows:    ?Mod hyperinflation / min T kyphosis  ? ? ? ?   ?Assessment  ? ?Mild intermittent asthma without complication ?Onset around 2017 better on symbicort 160 but then more hoarse ?- 01/05/2022  After extensive coaching inhaler device,  effectiveness =   75% from a baseline of 25% so continue symbicort 160 but try 1-2 bid  ? ?Based on two studies from NEJM  378; 17 p 1865 (2018) and 380 : p2020-30 (2019) in pts with mild asthma it is reasonable to use  symbicort 160 1-2  in this setting but I emphasized this was only shown with symbicort and takes advantage  of the rapid onset of action but is not the same as "rescue therapy" but can be stopped once the acute symptoms have resolved and the need for rescue has been minimized (< 2 x weekly)   ? ?F/u in 6 weeks with inhalers to assure correct usage and check on prior pulmonary eval when available.  ? ? ?    ?Hoarseness of voice ?ddx = gerd vs effect of symbicort 160  ? ?>>> try reduce symbicort  to 1 bid if tol and arm and hammer tootpast plus max gerd rx  ? ? ? ? ?Each maintenance medication was reviewed in detail including emphasizing most importantly the difference between maintenance and prns and under what circumstances the prns are to be triggered using an action plan format where appropriate. ? ?Total time for H and P, chart review, counseling, reviewing hfa device(s) and generating customized AVS unique to this initial office visit / same day charting > 45 min  ?     ? ? ? ? ?Christinia Gully, MD ?01/05/2022 ?  ?

## 2022-01-05 NOTE — Assessment & Plan Note (Signed)
ddx = gerd vs effect of symbicort 160  ? ?>>> try reduce symbicort to 1 bid if tol and arm and hammer tootpast plus max gerd rx  ? ?Each maintenance medication was reviewed in detail including emphasizing most importantly the difference between maintenance and prns and under what circumstances the prns are to be triggered using an action plan format where appropriate. ? ?Total time for H and P, chart review, counseling, reviewing hfa device(s) and generating customized AVS unique to this initial office visit / same day charting > 45 min  ?     ? ?

## 2022-01-05 NOTE — Telephone Encounter (Signed)
Orders have been faxed to the [facility] - they will call the patient with appt  ?

## 2022-01-05 NOTE — Assessment & Plan Note (Addendum)
Onset around 2017 better on symbicort 160 but then more hoarse ?- 01/05/2022  After extensive coaching inhaler device,  effectiveness =   75% from a baseline of 25% so continue symbicort 160 but try 1-2 bid  ? ?Based on two studies from NEJM  378; 91 p 1865 (2018) and 380 : p2020-30 (2019) in pts with mild asthma it is reasonable to use  symbicort 160 1-2  in this setting but I emphasized this was only shown with symbicort and takes advantage of the rapid onset of action but is not the same as "rescue therapy" but can be stopped once the acute symptoms have resolved and the need for rescue has been minimized (< 2 x weekly)   ? ?F/u in 6 weeks with inhalers to assure correct usage and check on prior pulmonary eval when available.  ? ? ?    ?  ? ? ?

## 2022-01-05 NOTE — Patient Instructions (Signed)
Plan A = Automatic = Always=    Symbicort 160 1-2 puffs every 12 hours and montelukast each evening along with protonix (pantoprazole) 40 mg Take 30-60 min before first meal of the day  ? ?Plan B = Backup (to supplement plan A, not to replace it) ?Only use your albuterol inhaler as a rescue medication to be used if you can't catch your breath by resting or doing a relaxed purse lip breathing pattern.  ?- The less you use it, the better it will work when you need it. ?- Ok to use the inhaler up to 2 puffs  every 4 hours if you must but call for appointment if use goes up over your usual need ?- Don't leave home without it !!  (think of it like the spare tire for your car)  ? ?Work on inhaler technique:  relax and gently blow all the way out then take a nice smooth full deep breath back in, triggering the inhaler at same time you start breathing in.  Hold for up to 5 seconds if you can. Blow out thru nose. Rinse and gargle with water when done.  If mouth or throat bother you at all,  try brushing teeth/gums/tongue with arm and hammer toothpaste/ make a slurry and gargle and spit out.  ? ?GERD (REFLUX)  is an extremely common cause of respiratory symptoms just like yours , many times with no obvious heartburn at all.  ? ? It can be treated with medication, but also with lifestyle changes including elevation of the head of your bed (ideally with 6 -8inch blocks under the headboard of your bed),  Smoking cessation, avoidance of late meals, excessive alcohol, and avoid fatty foods, chocolate, peppermint, colas, red wine, and acidic juices such as orange juice.  ?NO MINT OR MENTHOL PRODUCTS SO NO COUGH DROPS  ?USE SUGARLESS CANDY INSTEAD (Jolley ranchers or Stover's or Life Savers) or even ice chips will also do - the key is to swallow to prevent all throat clearing. ?NO OIL BASED VITAMINS - use powdered substitutes.  Avoid fish oil when coughing.  ? ?Please remember to go to the  x-ray department  for your tests - we will  call you with the results when they are available   ? ?Please schedule a follow up office visit in 6 weeks, call sooner if needed with all medications /inhalers/ solutions in hand so we can verify exactly what you are taking. This includes all medications from all doctors and over the counters  ? ? ? ? ? ?   ? ?  ? ?  ?

## 2022-01-05 NOTE — Telephone Encounter (Signed)
CALLED AND LMTCB  ?

## 2022-01-07 ENCOUNTER — Other Ambulatory Visit: Payer: Self-pay | Admitting: Nurse Practitioner

## 2022-01-07 DIAGNOSIS — J4520 Mild intermittent asthma, uncomplicated: Secondary | ICD-10-CM

## 2022-01-07 DIAGNOSIS — K2100 Gastro-esophageal reflux disease with esophagitis, without bleeding: Secondary | ICD-10-CM

## 2022-01-13 ENCOUNTER — Ambulatory Visit: Payer: Medicare Other | Admitting: Rheumatology

## 2022-01-13 ENCOUNTER — Ambulatory Visit: Payer: Medicare Other | Admitting: Internal Medicine

## 2022-01-13 NOTE — Telephone Encounter (Signed)
Patient has appt with the [facility] on 02/03/22 - would like sooner is possible. I checked with the [facility] and Forestine Na are both out past that date. Patient notified  ?

## 2022-01-15 ENCOUNTER — Encounter: Payer: Self-pay | Admitting: Neurology

## 2022-01-15 ENCOUNTER — Other Ambulatory Visit: Payer: Self-pay

## 2022-01-15 ENCOUNTER — Ambulatory Visit (INDEPENDENT_AMBULATORY_CARE_PROVIDER_SITE_OTHER): Payer: Medicare Other | Admitting: Neurology

## 2022-01-15 VITALS — BP 125/60 | HR 71 | Ht 66.75 in | Wt 121.0 lb

## 2022-01-15 DIAGNOSIS — E538 Deficiency of other specified B group vitamins: Secondary | ICD-10-CM

## 2022-01-15 DIAGNOSIS — G629 Polyneuropathy, unspecified: Secondary | ICD-10-CM | POA: Diagnosis not present

## 2022-01-15 DIAGNOSIS — R531 Weakness: Secondary | ICD-10-CM | POA: Diagnosis not present

## 2022-01-15 NOTE — Patient Instructions (Addendum)
?Non-Drug Treatment for Low Blood Pressure when Standing: ? ?1. Changing Postures: ? Change posture slowly when getting up, especially in the morning ? Hold on to something during the first few minutes after standing up. Do not start walking as soon as you get up from the chair ? Avoid prolonged recumbency or lying down ? Raise the head of the bed by 10 to 20 degrees ? ?2. Exercise: ? Perform Isotonic exercise, e.g.recumbent bike, pedaling movements while sitting in a chair ? Avoid exercises where you have to strain  ? ?3. Avoid Pooling of blood in legs: ? Wear custom-fitted elastic stockings. The ones which extend to the abdomen work even better. Consider wearing an abdominal binder. ? Perform physical counter-maneuvers, such as crossing legs and tensing leg muscles. ? ?4. Eating and Drinking: ? Small meals are recommended. Avoid large meals. Avoid standing suddenly after a large meal ? Avoid alcohol ? Increase intake of fluids and regular salt. A daily intake of up to 10 grams of sodium per day and a fluid intake of 2.0 to 2.5 liters per day (8 to 10 glasses of water) is recommended.  ? Rapid (over 3 minutes) ingestion of approximately 0.5 liter (2 glasses of water) of tap water, raises blood pressure within 5 to 15 minutes and lasts for an hour. ? ?5. Other Tips: ? Avoid hot baths. Instead take warm baths ? Maintain a BP record standing and lying down ? If you are only any BP lowering drugs (antihypertensives, diuretics, antidepressants, drugs for prostate, etc) ask your doctor to revisit the need to keep you on these drugs ? If all non-drug therapy fails, ask your doctor about drug therapy  ?Blood pressure is a measurement of how strongly, or weakly, your circulating blood is pressing against the walls of your arteries. Orthostatic hypotension is a drop in blood pressure that can happen when you change positions, such as when you go from lying down to standing. ?Arteries are blood vessels that carry blood from  your heart throughout your body. When blood pressure is too low, you may not get enough blood to your brain or to the rest of your organs. Orthostatic hypotension can cause light-headedness, sweating, rapid heartbeat, blurred vision, and fainting. These symptoms require further investigation into the cause. ?What are the causes? ?Orthostatic hypotension can be caused by many things, including: ?Sudden changes in posture, such as standing up quickly after you have been sitting or lying down or just standing ?Loss of blood (anemia) or loss of body fluids (dehydration). ?Heart problems, neurologic problems, or hormone problems. ?Pregnancy. ?Aging. The risk for this condition increases as you get older. ?Severe infection (sepsis). ?Certain medicines, such as medicines for high blood pressure or medicines that make the body lose excess fluids (diuretics). ?What are the signs or symptoms? ?Symptoms of this condition may include: ?Weakness, light-headedness, or dizziness. ?Sweating. ?Blurred vision. ?Tiredness (fatigue). ?Rapid heartbeat. ?Fainting, in severe cases. ?confusion ?How is this diagnosed? ?This condition is diagnosed based on: ?Your symptoms and medical history. ?Your blood pressure measurements. Your health care provider will check your blood pressure when you are: ?Lying down. ?Sitting. ?Standing. ?A blood pressure reading is recorded as two numbers, such as "120 over 80" (or 120/80). The first ("top") number is called the systolic pressure. It is a measure of the pressure in your arteries as your heart beats. The second ("bottom") number is called the diastolic pressure. It is a measure of the pressure in your arteries when your heart relaxes  between beats. Blood pressure is measured in a unit called mmHg. Healthy blood pressure for most adults is 120/80 mmHg. Orthostatic hypotension is defined as a 20 mmHg drop in systolic pressure or a 10 mmHg drop in diastolic pressure within 3 minutes of standing. ?Other  information or tests that may be used to diagnose orthostatic hypotension include: ?Your other vital signs, such as your heart rate and temperature. ?Blood tests. ?An electrocardiogram (ECG) or echocardiogram. ?A Holter monitor. This is a device you wear that records your heart rhythm continuously, usually for 24-48 hours. ?Tilt table test. For this test, you will be safely secured to a table that moves you from a lying position to an upright position. Your heart rhythm and blood pressure will be monitored during the test. ?How is this treated? ?This condition may be treated by: ?Changing your diet. This may involve eating more salt (sodium) or drinking more water. ?Changing the dosage of certain medicines you are taking that might be lowering your blood pressure. ?Correcting the underlying reason for the orthostatic hypotension. ?Wearing compression stockings. ?Taking medicines to raise your blood pressure. ?Avoiding actions that trigger symptoms. ?Follow these instructions at home: ?Medicines ?Take over-the-counter and prescription medicines only as told by your health care provider. ?Follow instructions from your health care provider about changing the dosage of your current medicines, if this applies. ?Do not stop or adjust any of your medicines on your own. ?Eating and drinking ? ?Drink enough fluid to keep your urine pale yellow. ?Eat extra salt only as directed. Do not add extra salt to your diet unless advised by your health care provider. ?Eat frequent, small meals. ?Avoid standing up suddenly after eating. ?General instructions ? ?Get up slowly from lying down or sitting positions. This gives your blood pressure a chance to adjust. ?Avoid hot showers and excessive heat as directed by your health care provider. ?Engage in regular physical activity as directed by your health care provider. ?If you have compression stockings, wear them as told. ?Keep all follow-up visits. This is important. ?Contact a health  care provider if: ?You have a fever for more than 2-3 days. ?You feel more thirsty than usual. ?You feel dizzy or weak. ?Get help right away if: ?You have chest pain. ?You have a fast or irregular heartbeat. ?You become sweaty or feel light-headed. ?You feel short of breath. ?You faint. ?You have any symptoms of a stroke. "BE FAST" is an easy way to remember the main warning signs of a stroke: ?B - Balance. Signs are dizziness, sudden trouble walking, or loss of balance. ?E - Eyes. Signs are trouble seeing or a sudden change in vision. ?F - Face. Signs are sudden weakness or numbness of the face, or the face or eyelid drooping on one side. ?A - Arms. Signs are weakness or numbness in an arm. This happens suddenly and usually on one side of the body. ?S - Speech. Signs are sudden trouble speaking, slurred speech, or trouble understanding what people say. ?T - Time. Time to call emergency services. Write down what time symptoms started. ?You have other signs of a stroke, such as: ?A sudden, severe headache with no known cause. ?Nausea or vomiting. ?Seizure. ?These symptoms may represent a serious problem that is an emergency. Do not wait to see if the symptoms will go away. Get medical help right away. Call your local emergency services (911 in the [place] Do not drive yourself to the hospital. ?Summary ?Orthostatic hypotension is a sudden drop in  blood pressure. ?It can cause light-headedness, sweating, rapid heartbeat, blurred vision, and fainting. ?Orthostatic hypotension can be diagnosed by having your blood pressure taken while lying down, sitting, and then standing. ?Treatment may involve changing your diet, wearing compression stockings, sitting up slowly, adjusting your medicines, or correcting the underlying reason for the orthostatic hypotension. ?Get help right away if you have chest pain, a fast or irregular heartbeat, or symptoms of a stroke. ?This information is not intended to replace advice given to you  by your health care provider. Make sure you discuss any questions you have with your health care provider. ?Document Revised: 01/02/2021 Document Reviewed: 01/02/2021 ?Elsevier Patient Education ? 2

## 2022-01-15 NOTE — Progress Notes (Signed)
[facility]    Provider:  Dr Lucia Gaskins Requesting Provider: Daryll Drown, NP Primary Care Provider:  Daryll Drown, NP  CC: episodic weakness and neuropathy   HPI:  Kristin Wallace is a 66 y.o. female here as requested by Daryll Drown, NP for chronic neuropathy.  Past medical history mild intermittent asthma, GERD, erosive osteoarthritis, A-fib, chronically dry eyes, fatigue (chronic and weakness), neuromuscular disorder, neuropathy of the feet.  She had an extensive workup with a neurologist and rheumatologists per records she has had extensive labs tested (at one point a rheumatologists did 31 labs) and was evaluated by neurology with bloodwork and emg/ncs and despite the multiple doctors and specialties she has been to(cardiology as well), even admitted inpatient and extensively tested without etiology for many symptoms,most often no one can find anything wrong with her, inpatient they gave her fluids and thought possible dehydration. She wants to talk about her episodes of weakness, not really her neuropathy, she perseverates on daily episodes of weakness for 2 years that occur daily for 15 minutes when she is upright, returning to baseline inbetween, no progression. She started having weird symptoms since having a covid vaccine, she will get episodically weak, her legs feel so weak, she doesn't fall, everything slows down, she talks slow then she walks slow, she remembers all the episodes, no altered mentation, no seizure-like activity, happens daily in the afternoons, lasts for 15 minutes, she drinks a body armour, she eats something sweet, usually she perks up and she gets home. Every day, regardless of how much activity she gets in. Almost feels like she is going to faint, gets lightheaded, she has been inpatient and extensively tested. She feels lightheaded, always happens when standing. No known triggers. She saw a cardiologist. She had a heart cath. She had afib when  she was 60. Stable. Was having has shooting pain in her ankles and feet and she has not had it recently. Neuropathy for 10 years, not progressive. Been to rheumatology and had extensive testing, had emg/ncs, had tilt test, everything was normal. No other focal neurologic deficits, associated symptoms, inciting events or modifiable factors.  Reviewed notes, labs and imaging from outside physicians, which showed: I reviewed notes from rheumatology on 12/23/2021, she has a history of erosive osteoarthritis and osteoporosis, she was evaluated by rheumatology for suspected inflammatory arthropathy due to chronic pain with increased symptoms at least 2 years ago.  She was prescribed hydroxychloroquine and treated with PIP joint steroid injections.  Imaging looks consistent with erosive OA of the hands, she continues to have pain and stiffness worse in the right hand lasting much of the day, she takes Lyrica more for leg neuropathy than joint pains just somewhat helpful.  She has some multiple toe fractures over the years and has mild neuropathy but no falls or fragility fractures.  No evidence of RA.  They checked sed rate and CRP, CBC and metabolic panel and referred her to ophthalmology to establish for hydroxychloroquine retinal toxicity monitoring.  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  Compared against MRI January 18, 2015.  I also reviewed reports from MRI January 18, 2015 and January 16, 2014 which were consistent without any significant changes.  MRI of the foot without contrast showed to be active Planter fasciitis with a partial tear involving the deep fibers of the central and medial band as well as full-thickness perforation involving the central band of the plantar aponeurosis, January 08, 2014.  Reviewed report.  January 14, 2017 vitamin B12 287, methylmalonic acid 126, TSH 3.5.  Vitamin B1 June 28, 2020 normal 20.  I reviewed office notes, she reported lightheadedness in July 2022, tightness on chest, no palpitation, had cardiac evaluation and was negative, asymptomatic when she is working outside or physically active, no numbness in face, hands sometimes tingle with pain, pain in feet improved.  Continue Lyrica.  Brain MRI did not explain numbness in face, she reviewed MRI with patient, on aspirin 81 assumedly for secondary stroke prevention, also on vitamin C, vitamin D3, and B12 supplements.  She had reported facial twitching, improved with decrease in caffeine.  She also had reported memory loss, MMSE 30 out of 30, note from July 2022.  Neurologic exam was normal.    Review of Systems: Patient complains of symptoms per HPI as well as the following symptoms neuropathy, episodes of weakness. Pertinent negatives and positives per HPI. All others negative.   Social History   Socioeconomic History   Marital status: Divorced    Spouse name: Not on file   Number of children: 3   Years of education: Not on file   Highest education level: Not on file  Occupational History   Occupation: retired  Tobacco Use   Smoking status: Never   Smokeless tobacco: Never  Vaping Use    Vaping Use: Never used  Substance and Sexual Activity   Alcohol use: Yes    Comment: 3 yearly   Drug use: Never   Sexual activity: Not on file  Other Topics Concern   Not on file  Social History Narrative   Moved here from [place] this year 07/27/2021.   Daughter lives with her   Right handed   Social Determinants of Health   Financial Resource Strain: Medium Risk   Difficulty of Paying Living Expenses: Somewhat hard  Food Insecurity: Food Insecurity Present   Worried About Programme researcher, broadcasting/film/video in the Last Year: Often true   Barista in the Last Year: Sometimes true  Transportation Needs: No Transportation Needs   Lack of Transportation (Medical): No   Lack of Transportation (Non-Medical): No  Physical Activity: Insufficiently Active   Days of Exercise per Week: 3 days   Minutes of Exercise per Session: 30 min  Stress: No Stress Concern Present   Feeling of Stress : Only a little  Social Connections: Moderately Integrated   Frequency of Communication with Friends and Family: More than three times a week   Frequency of Social Gatherings with Friends and Family: Once a week   Attends Religious Services: More than 4 times per year   Active Member of [employer] or Organizations: Yes   Attends Engineer, structural: More than 4 times per year   Marital Status: Divorced  Catering manager Violence: Not At Risk  Fear of Current or Ex-Partner: No   Emotionally Abused: No   Physically Abused: No   Sexually Abused: No    Family History  Problem Relation Age of Onset   Depression Mother    Other Mother        suicide   Other Father        plane crash   Heart disease Sister    Cancer Brother    Cancer Brother        skin cancer   Depression Daughter    Thyroid disease Daughter    Migraines Daughter     Past Medical History:  Diagnosis Date   Arthritis    erosive osteoarthritis   Asthma    Atrial fibrillation (HCC)    Chronically dry eyes    Fatigue     chronic ( and weakness)   GERD (gastroesophageal reflux disease)    Neuromuscular disorder (HCC)    neuropathy   Neuropathy    legs and feet   Osteoporosis     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 01/18/2022   Episodic weakness/lightheadedness when upright few minutes better with drinking fluid 01/18/2022   Annual physical exam 12/24/2021   Erosive osteoarthritis of both hands 12/23/2021   Hoarseness of voice 12/23/2021   Other fatigue 12/23/2021   Vitamin D deficiency 12/23/2021   Age-related osteoporosis without current pathological fracture 12/23/2021   Mild intermittent asthma without complication 10/13/2021   Gastroesophageal reflux disease with esophagitis without hemorrhage 10/13/2021   Establishing care with new doctor, encounter for 10/13/2021    Past Surgical History:  Procedure Laterality Date   ABDOMINAL HYSTERECTOMY     BREAST SURGERY Left    neg tumor   RIGHT AND LEFT HEART CATH     TUBAL LIGATION      Current Outpatient Medications  Medication Sig Dispense Refill   albuterol (VENTOLIN HFA) 108 (90 Base) MCG/ACT inhaler Inhale into the lungs every 6 (six) hours as needed for wheezing or shortness of breath.     Ascorbic Acid (VITAMIN C) 1000 MG tablet Take 1,000 mg by mouth in the morning and at bedtime.     budesonide-formoterol (SYMBICORT) 160-4.5 MCG/ACT inhaler Inhale 1 puff into the lungs 2 (two) times daily.     Calcium Carb-Cholecalciferol (CALCIUM 600+D3 PO) Take 1 each by mouth every morning.     Cholecalciferol (VITAMIN D3) 125 MCG (5000 UT) CAPS Take 1 each by mouth in the morning.     Cyanocobalamin (B-12) 2500 MCG TABS Take 1 each by mouth in the morning.     cycloSPORINE (RESTASIS) 0.05 % ophthalmic emulsion 1 drop 2 (two) times daily.     hydroxychloroquine (PLAQUENIL) 200 MG tablet Take 1 tablet (200 mg total) by mouth 2 (two) times daily. 180 tablet 1   montelukast  (SINGULAIR) 10 MG tablet TAKE 1 TABLET BY MOUTH AT BEDTIME 30 tablet 5   Multiple Vitamins-Minerals (MULTIVITAMIN WOMEN 50+) TABS Take 1 tablet by mouth daily.     pantoprazole (PROTONIX) 40 MG tablet Take 1 tablet by mouth once daily (Patient taking differently: Take 40 mg by mouth 2 (two) times daily.) 90 tablet 0   pregabalin (LYRICA) 75 MG capsule Take 75 mg by mouth 2 (two) times daily.     raloxifene (EVISTA) 60 MG tablet Take 1 tablet (60 mg total) by mouth daily. 30 tablet 1   TURMERIC CURCUMIN PO Take 1  capsule by mouth in the morning and at bedtime.     UNABLE TO FIND Take 2 each by mouth 2 (two) times daily. Med Name: HydroEye     No current facility-administered medications for this visit.    Allergies as of 01/15/2022 - Review Complete 01/15/2022  Allergen Reaction Noted   Shellfish allergy  10/13/2021    Vitals: BP 125/60 (BP Location: Left Arm, Patient Position: Sitting)   Pulse 71   Ht 5' 6.75" (1.695 m)   Wt 121 lb (54.9 kg)   BMI 19.09 kg/m  Last Weight:  Wt Readings from Last 1 Encounters:  01/15/22 121 lb (54.9 kg)   Last Height:   Ht Readings from Last 1 Encounters:  01/15/22 5' 6.75" (1.695 m)     Physical exam: Exam: Gen: NAD, conversant, well nourised, frail appearing, well groomed                     CV: RRR, no MRG. No Carotid Bruits. No peripheral edema, warm, nontender Eyes: Conjunctivae clear without exudates or hemorrhage  Neuro: Detailed Neurologic Exam  Speech:    Speech is normal; fluent and spontaneous with normal comprehension.  Cognition:    The patient is oriented to person, place, and time;     recent and remote memory intact;     language fluent;     normal attention, concentration,     fund of knowledge Cranial Nerves:    The pupils are equal, round, and reactive to light. Fundi are flat. Visual fields are full to threat. Extraocular movements are intact. Trigeminal sensation is intact and the muscles of mastication are  normal. Slightly asymmetric face, Jaw shifts slightly to the right, left NS flattening otherwise the face is symmetric. The palate elevates in the midline. Hearing intact. Voice is normal. Shoulder shrug is normal. The tongue has normal motion without fasciculations.   Coordination:    Normal    Gait:     normal.   Motor Observation:    No asymmetry, no atrophy, and no involuntary movements noted. Tone:    Normal muscle tone.    Posture:    Posture is normal. normal erect    Strength:    Strength is V/V in the upper and lower limbs.      Sensation: intact to LT     Reflex Exam:  DTR's:    Deep tendon reflexes in the upper and lower extremities are normal bilaterally.   Toes:    The toes are downgoing bilaterally.   Clonus:    Clonus is absent.    Assessment/Plan:   66 y.o. female here as requested by Daryll Drown, NP for chronic neuropathy.  Past medical history mild intermittent asthma, GERD, erosive osteoarthritis, A-fib, chronically dry eyes, fatigue (chronic and weakness), "neuromuscular disorder", neuropathy of the feet.She had an extensive workup with a neurologist and rheumatologists per patient report she has had extensive labs tested (at one point a rheumatologist did 31 labs) and was evaluated by neurology with bloodwork and emg/ncs and despite the multiple doctors and specialties she has been to(cardiology as well and others), even admitted inpatient and extensively tested without etiology for many symptoms,most often no one can find anything wrong with her, inpatient they gave her fluids and thought possible dehydration. She wants to talk about her episodes of weakness, not really her neuropathy, she perseverates on daily episodes of weakness for 2 years that occur daily for 15 minutes when she is upright,  returning to baseline inbetween, no progression, only when upright and feels better when sitting and drinking fluids and eating something.   - Exam is essentially  unremarkable, she has very minimal decrease to cold distally in the feet but intact to vibration, proprioception, pin prick, neuropathy stable. Strength is symmetrical without any concerning weakness. Gait is intact. Her daily episodes are not progressive and she feels better after sitting down and drinking fluids, may be some orthostasis, we discussed conservative measures and I gave her literature to read. F/u with pcp  - she has very minimal decrease to cold distally in the feet but intact to vibration, proprioception, pin prick, neuropathy stable and minimal. Her B12 has been low in the past, b12 deficiency can cause neuropathy, she eats a largely plant-based diet, given her extensive past workup I do not feel neurology has anymore to offer her. Pcp can refill Lyrica.   - RETURN TO PCP, nothing further from neurology  Orders Placed This Encounter  Procedures   Vitamin B12   Methylmalonic acid, serum    Cc: Daryll Drown, NP,  Daryll Drown, NP  Naomie Dean, MD  [facility] [address] [place], [place] [address]  Phone [phone] Fax [phone]  I spent over 60 minutes of face-to-face and non-face-to-face time with patient on the  1. 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 screen for B12 deficiency   3. Episodic weakness/lightheadedness when upright few minutes better with drinking fluid    diagnosis.  This included previsit chart review, lab review, study review, order entry, electronic health record documentation, patient education on the different diagnostic and therapeutic options, counseling and coordination of care, risks and benefits of management, compliance, or risk factor reduction

## 2022-01-18 ENCOUNTER — Encounter: Payer: Self-pay | Admitting: Neurology

## 2022-01-18 DIAGNOSIS — R531 Weakness: Secondary | ICD-10-CM | POA: Insufficient documentation

## 2022-01-18 DIAGNOSIS — G629 Polyneuropathy, unspecified: Secondary | ICD-10-CM | POA: Insufficient documentation

## 2022-01-19 LAB — METHYLMALONIC ACID, SERUM: Methylmalonic Acid: 99 nmol/L (ref 0–378)

## 2022-01-19 LAB — VITAMIN B12: Vitamin B-12: >2000 pg/mL — ABNORMAL HIGH (ref 232–1245)

## 2022-02-04 ENCOUNTER — Institutional Professional Consult (permissible substitution): Payer: Medicare Other | Admitting: Internal Medicine

## 2022-02-08 ENCOUNTER — Other Ambulatory Visit: Payer: Self-pay | Admitting: Nurse Practitioner

## 2022-02-26 ENCOUNTER — Ambulatory Visit (INDEPENDENT_AMBULATORY_CARE_PROVIDER_SITE_OTHER): Payer: Medicare Other | Admitting: Internal Medicine

## 2022-02-26 ENCOUNTER — Encounter: Payer: Self-pay | Admitting: Internal Medicine

## 2022-02-26 DIAGNOSIS — J4520 Mild intermittent asthma, uncomplicated: Secondary | ICD-10-CM

## 2022-02-26 NOTE — Patient Instructions (Signed)
Plan A = Automatic = Always=    Symbicort 160 1-2 every 12 hours  ? ?Plan B = Backup (to supplement plan A, not to replace it) ?Only use your albuterol inhaler as a rescue medication to be used if you can't catch your breath by resting or doing a relaxed purse lip breathing pattern.  ?- The less you use it, the better it will work when you need it. ?- Ok to use the inhaler up to 2 puffs  every 4 hours if you must but call for appointment if use goes up over your usual need ?- Don't leave home without it !!  (think of it like the spare tire for your car)  ?  ? ?Please schedule a follow up visit in 3 months but call sooner if needed  ?

## 2022-02-26 NOTE — Progress Notes (Signed)
? ?Kristin Wallace, female    DOB: [birth year],   MRN: [id number] ? ? 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  ? ? ? ?History of Present Illness  ?01/05/2022  Pulmonary/ 1st office eval/Nicholas Trompeter  ?Chief Complaint  ?Patient presents with  ? Pulmonary Consult  ?  Referred by [name], NP for eval of Asthma. She moved to [facility] from [place] in Sept 2022- needs to est with pulm for Asthma. She states her voice is hoarse and has dry cough.   ?Dyspnea:  walks with cane limited by weakness / dysequilibrium  ?Cough: minimal dry cough  ?Sleep: able to lie flat bed on side with one pillow ?SABA use: rarely  ?Rec ?Plan A = Automatic = Always=    Symbicort 160 1-2 puffs every 12 hours and montelukast each evening along with protonix (pantoprazole) 40 mg Take 30-60 min before first meal of the day  ?Plan B = Backup (to supplement plan A, not to replace it) ?Only use your albuterol inhaler as a rescue medication ?Work on inhaler technique:   ?GERD diet reviewed, bed blocks rec  ?Please schedule a follow up office visit in 6 weeks, call sooner if needed with all medications /inhalers/ solutions in hand   ? ? ? ? ? ?02/26/2022  f/u ov/Zameer Borman re: ? Asthma  maint on symbicort  160 one twice daily / singulair / ppi   ?Chief Complaint  ?Patient presents with  ? Follow-up  ? Dyspnea:  Not limited by breathing from desired activities   ?Cough:  a little raspy now and then / no problem with spring allergies since stopped dairy products  ?Sleeping: able to lie flat one pillow  ?SABA use: no rescue  ?02: not  ?Covid status:   vax x 4  ? ? ?No  obvious day to day or daytime variability or assoc excess/ purulent sputum or mucus plugs or hemoptysis or cp or chest tightness, subjective wheeze or overt sinus or hb symptoms.  ? ?Sleeping ok  without nocturnal  or early am exacerbation  of respiratory  c/o's or need for noct saba. Also denies any obvious fluctuation of symptoms with weather or environmental changes or other aggravating or alleviating factors except as outlined above  ? ?No unusual exposure hx or h/o childhood pna/ asthma or knowledge of premature birth. ? ?Current Allergies, Complete Past Medical History, Past Surgical History, Family History, and Social History were reviewed in [employer] record. ? ?ROS  The following are not active complaints unless bolded ?Hoarseness, sore throat, dysphagia, dental problems, itching, sneezing,  nasal congestion or discharge of excess mucus or purulent secretions, ear ache,   fever, chills, sweats, unintended wt loss or wt gain, classically pleuritic or exertional cp,  orthopnea pnd or arm/hand swelling  or leg swelling, presyncope, palpitations, abdominal pain, anorexia, nausea, vomiting, diarrhea  or change in bowel habits or change in bladder habits, change in stools or change in urine, dysuria, hematuria,  rash, arthralgias, visual complaints, headache, numbness, weakness or ataxia or problems with walking or coordination,  change in  mood or  memory. ?      ? ?Current Meds  ?Medication Sig  ? albuterol (VENTOLIN HFA) 108 (90 Base) MCG/ACT inhaler Inhale into the lungs every 6 (six) hours as needed for wheezing or shortness of breath.  ? Ascorbic Acid (VITAMIN C) 1000 MG tablet Take 1,000 mg by mouth in the morning and at bedtime.  ? budesonide-formoterol (SYMBICORT) 160-4.5 MCG/ACT inhaler Inhale 1 puff into the lungs 2 (two) times daily.  ? Calcium Carb-Cholecalciferol (CALCIUM 600+D3 PO) Take 1 each by mouth every morning.  ? Cholecalciferol (VITAMIN D3) 125 MCG (5000 UT) CAPS  Take 1 each by mouth in the morning.  ? Cyanocobalamin (B-12) 2500 MCG TABS Take 1 each by mouth in the morning.  ? cycloSPORINE (RESTASIS) 0.05 % ophthalmic emulsion 1 drop 2 (two) times daily.  ? hydroxychloroquine (PLAQUENIL) 200 MG tablet Take 1 tablet (200 mg total) by mouth 2 (two) times daily.  ? montelukast (SINGULAIR) 10 MG tablet TAKE 1 TABLET BY MOUTH AT BEDTIME  ? Multiple Vitamins-Minerals (MULTIVITAMIN WOMEN 50+) TABS Take 1 tablet by mouth daily.  ? pantoprazole (PROTONIX) 40 MG tablet Take 1 tablet by mouth once daily (Patient taking differently: Take 40 mg by mouth 2 (two) times daily.)  ? pregabalin (LYRICA) 75 MG capsule Take 75 mg by mouth 2 (two) times daily.  ? raloxifene (EVISTA) 60 MG tablet Take 1 tablet by mouth once daily  ? TURMERIC CURCUMIN PO Take 1 capsule by mouth in the morning and at bedtime.  ? UNABLE TO FIND Take 2 each by mouth 2 (two) times daily. Med Name: HydroEye  ?    ? ?  ? ?  ?   ? ?Past Medical History:  ?Diagnosis Date  ? Arthritis   ? erosive osteoarthritis  ? Asthma   ? Atrial fibrillation ([place])   ? Chronically dry eyes   ? Fatigue   ? chronic ( and weakness)  ? GERD (gastroesophageal reflux disease)   ? Neuromuscular disorder ([place])   ? neuropathy  ? Neuropathy   ? legs and feet  ? Osteoporosis   ? ?  ? ? ?Objective:  ?  ? ?Wt Readings from Last 3 Encounters:  ?02/26/22 126 lb 12.8 oz (57.5 kg)  ?01/15/22 121 lb (54.9 kg)  ?01/05/22 121 lb (54.9 kg)  ?  ? ? ?Vital signs reviewed  02/26/2022  - Note at rest 02 sats  100% on RA  ? ?General appearance:    amb wf nad ? ? HEENT : oropharynx clear  / no thrush ? ? ?NECK :  without JVD/Nodes/TM/ nl carotid upstrokes bilaterally ? ? ?LUNGS: no acc muscle use,  Nl contour chest which is clear to A and P bilaterally without cough on insp or exp maneuvers ? ? ?CV:  RRR  no s3 or murmur or increase in P2, and no edema  ? ?ABD:  soft and nontender with nl inspiratory excursion in the supine position. No bruits or organomegaly  appreciated, bowel sounds nl ? ?MS:  Nl gait/ ext warm without deformities, calf tenderness, cyanosis or clubbing ?No obvious joint restrictions  ? ?SKIN: warm and dry without lesions   ? ?NEURO:  alert, approp, nl sensorium with  no motor or cerebellar deficits apparent.    ? ?  ? ?  ? ?  ? ? ? ?   ?Assessment  ? ?  ? ?  ?  ? ? ? ?  ?

## 2022-02-27 ENCOUNTER — Encounter: Payer: Self-pay | Admitting: Internal Medicine

## 2022-02-27 NOTE — Assessment & Plan Note (Signed)
Onset around 2017 better on symbicort 160 but then more hoarse ?- 12/23/21   Eos 101  (0.1)  ?- 01/05/2022  After extensive coaching inhaler device,  effectiveness =   75% from a baseline of 25% so continue symbicort 160 but try 1-2 bid  ? ?All goals of chronic asthma control met including optimal function and elimination of symptoms with minimal need for rescue therapy. ? ?Contingencies discussed in full including contacting this office immediately if not controlling the symptoms using the rule of two's.    ? ?F/u  3 months, sooner if needed  ? ?    ?  ? ?Each maintenance medication was reviewed in detail including emphasizing most importantly the difference between maintenance and prns and under what circumstances the prns are to be triggered using an action plan format where appropriate. ? ?Total time for H and P, chart review, counseling, reviewing hfa device(s) and generating customized AVS unique to this office visit / same day charting =21 min  ?     ?

## 2022-03-03 ENCOUNTER — Other Ambulatory Visit: Payer: Self-pay | Admitting: Nurse Practitioner

## 2022-03-04 DIAGNOSIS — Z961 Presence of intraocular lens: Secondary | ICD-10-CM | POA: Diagnosis not present

## 2022-03-04 DIAGNOSIS — H26493 Other secondary cataract, bilateral: Secondary | ICD-10-CM | POA: Diagnosis not present

## 2022-03-04 DIAGNOSIS — D3131 Benign neoplasm of right choroid: Secondary | ICD-10-CM | POA: Diagnosis not present

## 2022-03-04 DIAGNOSIS — M1990 Unspecified osteoarthritis, unspecified site: Secondary | ICD-10-CM | POA: Diagnosis not present

## 2022-03-04 DIAGNOSIS — H33302 Unspecified retinal break, left eye: Secondary | ICD-10-CM | POA: Diagnosis not present

## 2022-03-04 DIAGNOSIS — Z79899 Other long term (current) drug therapy: Secondary | ICD-10-CM | POA: Diagnosis not present

## 2022-03-04 NOTE — Telephone Encounter (Signed)
OV 12/24/21 RTC 1 yr Next OV 12/30/22 ?

## 2022-03-11 NOTE — Progress Notes (Signed)
Office Visit Note  Patient: Kristin Wallace             Date of Birth: Jan 12, 1956           MRN: [id number]             PCP: Ivy Lynn, NP Referring: Ivy Lynn, NP Visit Date: 03/25/2022   Subjective:  Arthritis (Doing good)   History of Present Illness: Kristin Wallace is a 66 y.o. female here for follow up for erosive OA and osteoporosis.  She is overall doing pretty well.  She still has impaired mobility and dexterity with the finger deformities but pain is somewhat decreased.  No major flareup with increase in localized joint swelling.  Previous HPI 12/23/2021 Kristin Wallace is a 66 y.o. female here for erosive OA and osteoporosis. 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 She was prescribed hydroxychloroquine and treated with PIP joint steroid injection. Imaging of this previously looked consistent with erosive OA of the hands. She continues to have pain and stiffness worst in the right hand lasting much of the day. She takes lyrica more for leg neuropathy than joint pains which is somewhat helpful. For osteoporosis she started raloxifene due to need for ongoing dental procedures so avoided bisphosphonate treatment. She has some multiple toe fractures over the years and has mild neuropathy but no falls or fragility fractures.   Review of Systems  Constitutional:  Positive for fatigue.  HENT:  Positive for mouth dryness.   Eyes:  Positive for dryness.  Respiratory:  Positive for shortness of breath.   Cardiovascular:  Negative for swelling in legs/feet.  Gastrointestinal:  Positive for diarrhea.  Endocrine: Positive for increased urination.  Genitourinary:  Negative for difficulty urinating.  Musculoskeletal:  Positive for joint pain, gait problem, joint pain, joint swelling, muscle weakness, morning stiffness and muscle tenderness.  Skin:  Positive for redness.   Allergic/Immunologic: Negative for susceptible to infections.  Neurological:  Positive for numbness and weakness.  Hematological:  Negative for bruising/bleeding tendency.  Psychiatric/Behavioral:  Positive for sleep disturbance.    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 01/18/2022   Episodic weakness/lightheadedness when upright few minutes better with drinking fluid 01/18/2022   Annual physical exam 12/24/2021   Erosive osteoarthritis of both hands 12/23/2021   Hoarseness of voice 12/23/2021   Other fatigue 12/23/2021   Vitamin D deficiency 12/23/2021   Age-related osteoporosis without current pathological fracture 12/23/2021   Mild intermittent asthma without complication 40/98/1191   Gastroesophageal reflux disease with esophagitis without hemorrhage 10/13/2021   Establishing care with new doctor, encounter for 10/13/2021    Past Medical History:  Diagnosis Date   Arthritis    erosive osteoarthritis   Asthma    Atrial fibrillation ([facility])    Chronically dry eyes    Fatigue    chronic ( and weakness)   GERD (gastroesophageal reflux disease)    Neuromuscular disorder ([facility])    neuropathy   Neuropathy    legs and feet   Osteoporosis     Family History  Problem Relation Age of Onset   Depression Mother    Other Mother        suicide   Other Father        plane crash   Heart disease Sister    Cancer  Brother    Cancer Brother        skin cancer   Depression Daughter    Thyroid disease Daughter    Migraines Daughter    Past Surgical History:  Procedure Laterality Date   ABDOMINAL HYSTERECTOMY     BREAST SURGERY Left    neg tumor   RIGHT AND LEFT HEART CATH     TUBAL LIGATION     Social History   Social History Narrative   Moved here from [place] this year 07/27/2021.   Daughter lives with her   Right handed   Immunization History  Administered Date(s) Administered    Moderna SARS-COV2 Booster Vaccination 09/06/2020, 05/17/2021   Moderna Sars-Covid-2 Vaccination 11/13/2019, 12/11/2019     Objective: Vital Signs: BP 110/66 (BP Location: Left Arm, Patient Position: Sitting, Cuff Size: Normal)   Pulse 76   Resp 15   Ht 5' 5.75" (1.67 m)   Wt 125 lb 3.2 oz (56.8 kg)   BMI 20.36 kg/m    Physical Exam Cardiovascular:     Rate and Rhythm: Normal rate and regular rhythm.  Pulmonary:     Effort: Pulmonary effort is normal.     Breath sounds: Normal breath sounds.  Skin:    General: Skin is warm and dry.     Findings: No rash.  Neurological:     Mental Status: She is alert.  Psychiatric:        Mood and Affect: Mood normal.     Musculoskeletal Exam:  Elbows full ROM no tenderness or swelling Wrists full ROM no tenderness or swelling Bony enlargement in multiple fingers worst at right hand 3rd and then 2nd PIPs with decreased ROM and lateral deviation, no palpable synovitis, 1st CMC joint enlarged and squaring worse on left hand Knees full ROM no tenderness or swelling  Investigation: No additional findings.  Imaging: No results found.  Recent Labs: Lab Results  Component Value Date   WBC 4.6 12/23/2021   HGB 13.1 12/23/2021   PLT 311 12/23/2021   NA 141 12/23/2021   K 4.3 12/23/2021   CL 105 12/23/2021   CO2 30 12/23/2021   GLUCOSE 91 12/23/2021   BUN 9 12/23/2021   CREATININE 0.57 12/23/2021   BILITOT 0.4 12/23/2021   AST 16 12/23/2021   ALT 14 12/23/2021   PROT 6.8 12/23/2021   CALCIUM 9.9 12/23/2021    Speciality Comments: PLQ Eye Exam 03/04/2022 [facility] f/u 12 months.  Procedures:  No procedures performed Allergies: Shellfish allergy   Assessment / Plan:     Visit Diagnoses: Erosive osteoarthritis of both hands  Doing pretty well at this time no severe flareups and joint pain is reasonably controlled.  She had previously benefited with local steroid injection rheumatology provider but no highly  inflamed joints at this time so recommend continuing the current treatment.  High risk medication use - HCQ 400 mg per day. PLQ Eye Exam 03/04/2022  Hoarseness of voice - Not clear cause, does not describe significant dysphagia or odynophagia. She does have some dryness mouth but eyes are worse.  Would be a better candidate for intravenous or injectable antiresorptive treatment for osteoporosis due to symptoms suspicious for some dysphagia.  Vitamin D deficiency Age-related osteoporosis without current pathological fracture - raloxifene 60 mg daily   Currently on raloxifene she would be a candidate for stronger treatment on antiresorptive's but has been delaying this due to anticipating needing major dental surgical work.  She is now scheduled  to see a provider for this around September of this year.  I recommend we could follow-up on her progress with this later this year.  Orders: No orders of the defined types were placed in this encounter.  No orders of the defined types were placed in this encounter.    Follow-Up Instructions: Return in about 6 months (around 09/25/2022) for [facility] on HCQ, OP plan f/u 43mo.   CCollier Salina MD  Note - This record has been created using [employer]  Chart creation errors have been sought, but may not always  have been located. Such creation errors do not reflect on  the standard of medical care.

## 2022-03-19 ENCOUNTER — Telehealth: Payer: Self-pay | Admitting: Internal Medicine

## 2022-03-19 MED ORDER — ALBUTEROL SULFATE HFA 108 (90 BASE) MCG/ACT IN AERS: 1.0000 | INHALATION_SPRAY | Freq: Four times a day (QID) | RESPIRATORY_TRACT | 1 refills | Status: DC | PRN

## 2022-03-19 NOTE — Telephone Encounter (Signed)
Called and spoke with  patient. Patient verified pharmacy. Refill has been sent.   Nothing further needed.  

## 2022-03-25 ENCOUNTER — Ambulatory Visit (INDEPENDENT_AMBULATORY_CARE_PROVIDER_SITE_OTHER): Payer: Medicare Other | Admitting: Internal Medicine

## 2022-03-25 ENCOUNTER — Encounter: Payer: Self-pay | Admitting: Internal Medicine

## 2022-03-25 VITALS — BP 110/66 | HR 76 | Resp 15 | Ht 65.75 in | Wt 125.2 lb

## 2022-03-25 DIAGNOSIS — M810 Age-related osteoporosis without current pathological fracture: Secondary | ICD-10-CM

## 2022-03-25 DIAGNOSIS — Z79899 Other long term (current) drug therapy: Secondary | ICD-10-CM

## 2022-03-25 DIAGNOSIS — E559 Vitamin D deficiency, unspecified: Secondary | ICD-10-CM

## 2022-03-25 DIAGNOSIS — R5383 Other fatigue: Secondary | ICD-10-CM

## 2022-03-25 DIAGNOSIS — R490 Dysphonia: Secondary | ICD-10-CM

## 2022-03-25 DIAGNOSIS — M154 Erosive (osteo)arthritis: Secondary | ICD-10-CM | POA: Diagnosis not present

## 2022-03-27 DIAGNOSIS — M546 Pain in thoracic spine: Secondary | ICD-10-CM | POA: Diagnosis not present

## 2022-03-27 DIAGNOSIS — M542 Cervicalgia: Secondary | ICD-10-CM | POA: Diagnosis not present

## 2022-03-27 DIAGNOSIS — M9901 Segmental and somatic dysfunction of cervical region: Secondary | ICD-10-CM | POA: Diagnosis not present

## 2022-03-27 DIAGNOSIS — M9902 Segmental and somatic dysfunction of thoracic region: Secondary | ICD-10-CM | POA: Diagnosis not present

## 2022-03-27 DIAGNOSIS — M9903 Segmental and somatic dysfunction of lumbar region: Secondary | ICD-10-CM | POA: Diagnosis not present

## 2022-03-31 DIAGNOSIS — M9901 Segmental and somatic dysfunction of cervical region: Secondary | ICD-10-CM | POA: Diagnosis not present

## 2022-03-31 DIAGNOSIS — M9902 Segmental and somatic dysfunction of thoracic region: Secondary | ICD-10-CM | POA: Diagnosis not present

## 2022-03-31 DIAGNOSIS — M542 Cervicalgia: Secondary | ICD-10-CM | POA: Diagnosis not present

## 2022-03-31 DIAGNOSIS — M546 Pain in thoracic spine: Secondary | ICD-10-CM | POA: Diagnosis not present

## 2022-03-31 DIAGNOSIS — M9903 Segmental and somatic dysfunction of lumbar region: Secondary | ICD-10-CM | POA: Diagnosis not present

## 2022-04-03 DIAGNOSIS — M9902 Segmental and somatic dysfunction of thoracic region: Secondary | ICD-10-CM | POA: Diagnosis not present

## 2022-04-03 DIAGNOSIS — M9901 Segmental and somatic dysfunction of cervical region: Secondary | ICD-10-CM | POA: Diagnosis not present

## 2022-04-03 DIAGNOSIS — M546 Pain in thoracic spine: Secondary | ICD-10-CM | POA: Diagnosis not present

## 2022-04-03 DIAGNOSIS — M542 Cervicalgia: Secondary | ICD-10-CM | POA: Diagnosis not present

## 2022-04-03 DIAGNOSIS — M9903 Segmental and somatic dysfunction of lumbar region: Secondary | ICD-10-CM | POA: Diagnosis not present

## 2022-04-06 DIAGNOSIS — M9903 Segmental and somatic dysfunction of lumbar region: Secondary | ICD-10-CM | POA: Diagnosis not present

## 2022-04-06 DIAGNOSIS — M546 Pain in thoracic spine: Secondary | ICD-10-CM | POA: Diagnosis not present

## 2022-04-06 DIAGNOSIS — M9902 Segmental and somatic dysfunction of thoracic region: Secondary | ICD-10-CM | POA: Diagnosis not present

## 2022-04-06 DIAGNOSIS — M9901 Segmental and somatic dysfunction of cervical region: Secondary | ICD-10-CM | POA: Diagnosis not present

## 2022-04-06 DIAGNOSIS — M542 Cervicalgia: Secondary | ICD-10-CM | POA: Diagnosis not present

## 2022-04-10 DIAGNOSIS — M9901 Segmental and somatic dysfunction of cervical region: Secondary | ICD-10-CM | POA: Diagnosis not present

## 2022-04-10 DIAGNOSIS — M546 Pain in thoracic spine: Secondary | ICD-10-CM | POA: Diagnosis not present

## 2022-04-10 DIAGNOSIS — M9902 Segmental and somatic dysfunction of thoracic region: Secondary | ICD-10-CM | POA: Diagnosis not present

## 2022-04-10 DIAGNOSIS — M9903 Segmental and somatic dysfunction of lumbar region: Secondary | ICD-10-CM | POA: Diagnosis not present

## 2022-04-10 DIAGNOSIS — M542 Cervicalgia: Secondary | ICD-10-CM | POA: Diagnosis not present

## 2022-04-15 DIAGNOSIS — M9902 Segmental and somatic dysfunction of thoracic region: Secondary | ICD-10-CM | POA: Diagnosis not present

## 2022-04-15 DIAGNOSIS — M542 Cervicalgia: Secondary | ICD-10-CM | POA: Diagnosis not present

## 2022-04-15 DIAGNOSIS — M9901 Segmental and somatic dysfunction of cervical region: Secondary | ICD-10-CM | POA: Diagnosis not present

## 2022-04-15 DIAGNOSIS — M546 Pain in thoracic spine: Secondary | ICD-10-CM | POA: Diagnosis not present

## 2022-04-15 DIAGNOSIS — M9903 Segmental and somatic dysfunction of lumbar region: Secondary | ICD-10-CM | POA: Diagnosis not present

## 2022-04-17 DIAGNOSIS — M546 Pain in thoracic spine: Secondary | ICD-10-CM | POA: Diagnosis not present

## 2022-04-17 DIAGNOSIS — M542 Cervicalgia: Secondary | ICD-10-CM | POA: Diagnosis not present

## 2022-04-17 DIAGNOSIS — M9902 Segmental and somatic dysfunction of thoracic region: Secondary | ICD-10-CM | POA: Diagnosis not present

## 2022-04-17 DIAGNOSIS — M9903 Segmental and somatic dysfunction of lumbar region: Secondary | ICD-10-CM | POA: Diagnosis not present

## 2022-04-17 DIAGNOSIS — M9901 Segmental and somatic dysfunction of cervical region: Secondary | ICD-10-CM | POA: Diagnosis not present

## 2022-05-02 DIAGNOSIS — N390 Urinary tract infection, site not specified: Secondary | ICD-10-CM | POA: Diagnosis not present

## 2022-05-02 DIAGNOSIS — K5730 Diverticulosis of large intestine without perforation or abscess without bleeding: Secondary | ICD-10-CM | POA: Diagnosis not present

## 2022-05-02 DIAGNOSIS — E876 Hypokalemia: Secondary | ICD-10-CM | POA: Diagnosis not present

## 2022-05-02 DIAGNOSIS — R110 Nausea: Secondary | ICD-10-CM | POA: Diagnosis not present

## 2022-05-02 DIAGNOSIS — E860 Dehydration: Secondary | ICD-10-CM | POA: Diagnosis not present

## 2022-05-02 DIAGNOSIS — N289 Disorder of kidney and ureter, unspecified: Secondary | ICD-10-CM | POA: Diagnosis not present

## 2022-05-02 DIAGNOSIS — K529 Noninfective gastroenteritis and colitis, unspecified: Secondary | ICD-10-CM | POA: Diagnosis not present

## 2022-05-02 DIAGNOSIS — K6389 Other specified diseases of intestine: Secondary | ICD-10-CM | POA: Diagnosis not present

## 2022-05-02 DIAGNOSIS — K838 Other specified diseases of biliary tract: Secondary | ICD-10-CM | POA: Diagnosis not present

## 2022-05-02 DIAGNOSIS — Z91013 Allergy to seafood: Secondary | ICD-10-CM | POA: Diagnosis not present

## 2022-05-02 DIAGNOSIS — K828 Other specified diseases of gallbladder: Secondary | ICD-10-CM | POA: Diagnosis not present

## 2022-05-02 DIAGNOSIS — R1032 Left lower quadrant pain: Secondary | ICD-10-CM | POA: Diagnosis not present

## 2022-05-02 DIAGNOSIS — N3289 Other specified disorders of bladder: Secondary | ICD-10-CM | POA: Diagnosis not present

## 2022-05-04 ENCOUNTER — Encounter: Payer: Self-pay | Admitting: Nurse Practitioner

## 2022-05-04 ENCOUNTER — Ambulatory Visit (INDEPENDENT_AMBULATORY_CARE_PROVIDER_SITE_OTHER): Payer: Medicare Other | Admitting: Nurse Practitioner

## 2022-05-04 VITALS — BP 103/68 | HR 75 | Temp 98.9°F | Ht 65.0 in | Wt 123.6 lb

## 2022-05-04 DIAGNOSIS — R197 Diarrhea, unspecified: Secondary | ICD-10-CM | POA: Diagnosis not present

## 2022-05-04 NOTE — Patient Instructions (Signed)
Gastritis, Adult Gastritis is irritation and swelling (inflammation) of the stomach. There are two kinds of gastritis: Acute gastritis. This kind develops quickly. Chronic gastritis. This kind is much more common. It develops slowly and lasts for a long time. It is important to get help for this condition. If you do not get help, your stomach can bleed, and you can get sores (ulcers) in your stomach. What are the causes? This condition may be caused by: Germs that get to your stomach and cause an infection. Drinking too much alcohol. Medicines you are taking. Having too much acid in the stomach. Having a disease of the stomach. Other causes may include: An allergic reaction. Some cancer treatments (radiation). Smoking cigarettes or using products that contain nicotine or tobacco. In some cases, the cause of this condition is not known. What increases the risk? Having a disease of the intestines. Having Crohn's disease. Using aspirin or ibuprofen and other NSAIDs to treat other conditions. Stress. What are the signs or symptoms? Pain in your stomach. A burning feeling in your stomach. Feeling like you may vomit (nauseous). Vomiting or vomiting blood. Feeling too full after you eat. Weight loss. Bad breath. Blood in your poop (stool). In some cases, there are no symptoms. How is this treated? This condition is treated with medicines. The medicines that are used depend on what caused the condition. You may be given: Antibiotic medicine, if your condition was caused by an infection from germs. H2 blockers and similar medicines, if your condition was caused by too much acid in the stomach. Treatment may also include stopping the use of certain medicines, such as aspirin or ibuprofen. Follow these instructions at home: Medicines Take over-the-counter and prescription medicines only as told by your doctor. If you were prescribed an antibiotic medicine, take it as told by your doctor.  Do not stop taking it even if you start to feel better. Alcohol use Do not drink alcohol if: Your doctor tells you not to drink. You are pregnant, may be pregnant, or are planning to become pregnant. If you drink alcohol: Limit your use to: 0-1 drink a day for women. 0-2 drinks a day for men. Know how much alcohol is in your drink. In the [place] one drink equals one 12 oz bottle of beer (355 mL), one 5 oz glass of wine (148 mL), or one 1 oz glass of hard liquor (44 mL). General instructions  Eat small meals often, instead of large meals. Avoid foods and drinks that make you feel worse. Drink enough fluid to keep your pee (urine) pale yellow. Talk with your doctor about ways to manage stress. You can exercise or do deep breathing, meditation, or yoga. Do not smoke or use any products that contain nicotine or tobacco. If you need help quitting, ask your doctor. Keep all follow-up visits. Contact a doctor if: Your symptoms get worse. Your stomach pain gets worse. Your symptoms go away and then come back. You have a fever. Get help right away if: You vomit blood or something that looks like coffee grounds. You have black or dark red poop. You throw up any time you try to drink fluids. These symptoms may be an emergency. Get help right away. Call your local emergency services (911 in the [place] Do not wait to see if the symptoms will go away. Do not drive yourself to the hospital. Summary Gastritis is irritation and swelling (inflammation) of the stomach. You must get help for this condition. If you do   not get help, your stomach can bleed, and you can get sores (ulcers) in your stomach. You can be treated with medicines for germs or medicines to block too much acid in your stomach. This information is not intended to replace advice given to you by your health care provider. Make sure you discuss any questions you have with your health care provider. Document Revised: 02/22/2021 Document  Reviewed: 02/22/2021 Elsevier Patient Education  2023 [employer]  

## 2022-05-04 NOTE — Progress Notes (Signed)
Established Patient Office Visit  Subjective   Patient ID: Kristin Wallace, female    DOB: Apr 15, 1956  Age: 66 y.o. MRN: [id number]  Chief Complaint  Patient presents with   Hospitalization Follow-up    Stomach virus     Diarrhea  This is a recurrent problem. Episode onset: Past 5 days. The problem occurs 2 to 4 times per day. The problem has been gradually improving. The stool consistency is described as Watery. Associated symptoms include abdominal pain. Pertinent negatives include no bloating, fever, headaches, myalgias, sweats or vomiting. Exacerbated by: Food. There are no known risk factors. She has tried change of diet and electrolyte solution for the symptoms.  Abdominal Pain This is a recurrent problem. The current episode started in the past 7 days. The onset quality is gradual. The problem occurs intermittently. The problem has been gradually improving. The pain is located in the generalized abdominal region. The pain is mild. The quality of the pain is aching. The abdominal pain does not radiate. Associated symptoms include diarrhea. Pertinent negatives include no fever, headaches, myalgias or vomiting. Nothing aggravates the pain. The pain is relieved by Nothing. The treatment provided no relief.    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 01/18/2022   Episodic weakness/lightheadedness when upright few minutes better with drinking fluid 01/18/2022   Annual physical exam 12/24/2021   Erosive osteoarthritis of both hands 12/23/2021   Hoarseness of voice 12/23/2021   Other fatigue 12/23/2021   Vitamin D deficiency 12/23/2021   Age-related osteoporosis without current pathological fracture 12/23/2021   Mild intermittent asthma without complication 10/13/2021   Gastroesophageal reflux disease with esophagitis without hemorrhage 10/13/2021   Establishing care with new doctor, encounter for 10/13/2021   Past  Medical History:  Diagnosis Date   Arthritis    erosive osteoarthritis   Asthma    Atrial fibrillation (HCC)    Chronically dry eyes    Fatigue    chronic ( and weakness)   GERD (gastroesophageal reflux disease)    Neuromuscular disorder (HCC)    neuropathy   Neuropathy    legs and feet   Osteoporosis    Past Surgical History:  Procedure Laterality Date   ABDOMINAL HYSTERECTOMY     BREAST SURGERY Left    neg tumor   RIGHT AND LEFT HEART CATH     TUBAL LIGATION     Social History   Tobacco Use   Smoking status: Never   Smokeless tobacco: Never  Vaping Use   Vaping Use: Never used  Substance Use Topics   Alcohol use: Yes    Comment: 3 yearly   Drug use: Never   Social History   Socioeconomic History   Marital status: Divorced    Spouse name: Not on file   Number of children: 3   Years of education: Not on file   Highest education level: Not on file  Occupational History   Occupation: retired  Tobacco Use   Smoking status: Never   Smokeless tobacco: Never  Vaping Use   Vaping Use: Never used  Substance and Sexual Activity   Alcohol use: Yes    Comment: 3 yearly   Drug use: Never   Sexual activity: Not on file  Other Topics Concern   Not on file  Social History Narrative   Moved here from [place] this year 07/27/2021.   Daughter lives with her   Right handed  Social Determinants of Health   Financial Resource Strain: Medium Risk (11/25/2021)   Overall Financial Resource Strain (CARDIA)    Difficulty of Paying Living Expenses: Somewhat hard  Food Insecurity: Food Insecurity Present (12/03/2021)   Hunger Vital Sign    Worried About Running Out of Food in the Last Year: Often true    Ran Out of Food in the Last Year: Sometimes true  Transportation Needs: No Transportation Needs (11/25/2021)   PRAPARE - Hydrologist (Medical): No    Lack of Transportation (Non-Medical): No  Physical Activity: Insufficiently Active  (11/25/2021)   Exercise Vital Sign    Days of Exercise per Week: 3 days    Minutes of Exercise per Session: 30 min  Stress: No Stress Concern Present (11/25/2021)   [place]    Feeling of Stress : Only a little  Social Connections: Moderately Integrated (11/25/2021)   Social Connection and Isolation Panel [NHANES]    Frequency of Communication with Friends and Family: More than three times a week    Frequency of Social Gatherings with Friends and Family: Once a week    Attends Religious Services: More than 4 times per year    Active Member of [employer] or Organizations: Yes    Attends Archivist Meetings: More than 4 times per year    Marital Status: Divorced  Intimate Partner Violence: Not At Risk (11/25/2021)   Humiliation, Afraid, Rape, and Kick questionnaire    Fear of Current or Ex-Partner: No    Emotionally Abused: No    Physically Abused: No    Sexually Abused: No   Family Status  Relation Name Status   Mother  Deceased   Father  Deceased   Sister  Deceased   Brother  Deceased   Brother  Alive   Daughter  Alive   Daughter  Alive   Son  Alive   Family History  Problem Relation Age of Onset   Depression Mother    Other Mother        suicide   Other Father        plane crash   Heart disease Sister    Cancer Brother    Cancer Brother        skin cancer   Depression Daughter    Thyroid disease Daughter    Migraines Daughter    Allergies  Allergen Reactions   Shellfish Allergy     Vomiting       Review of Systems  Constitutional:  Negative for fever.  HENT: Negative.    Eyes: Negative.   Respiratory: Negative.    Cardiovascular: Negative.   Gastrointestinal:  Positive for abdominal pain and diarrhea. Negative for bloating and vomiting.  Genitourinary: Negative.   Musculoskeletal:  Negative for falls and myalgias.  Skin: Negative.   Neurological:  Negative for headaches.  All other  systems reviewed and are negative.     Objective:     BP 103/68   Pulse 75   Temp 98.9 F (37.2 C)   Ht $R'5\' 5"'oX$  (1.651 m)   Wt 123 lb 9.6 oz (56.1 kg)   SpO2 97%   BMI 20.57 kg/m  BP Readings from Last 3 Encounters:  05/04/22 103/68  03/25/22 110/66  02/26/22 108/72   Wt Readings from Last 3 Encounters:  05/04/22 123 lb 9.6 oz (56.1 kg)  03/25/22 125 lb 3.2 oz (56.8 kg)  02/26/22 126 lb 12.8 oz (  57.5 kg)      Physical Exam Vitals and nursing note reviewed.  Constitutional:      Appearance: Normal appearance.  HENT:     Head: Normocephalic.     Right Ear: External ear normal.     Left Ear: External ear normal.     Nose: Nose normal.  Eyes:     Conjunctiva/sclera: Conjunctivae normal.  Cardiovascular:     Rate and Rhythm: Normal rate and regular rhythm.     Pulses: Normal pulses.     Heart sounds: Normal heart sounds.  Pulmonary:     Effort: Pulmonary effort is normal.     Breath sounds: Normal breath sounds.  Abdominal:     General: Abdomen is flat. Bowel sounds are normal.     Palpations: Abdomen is soft.     Tenderness: There is abdominal tenderness.  Skin:    General: Skin is warm and dry.     Findings: No rash.  Neurological:     General: No focal deficit present.     Mental Status: She is alert and oriented to person, place, and time.      No results found for any visits on 05/04/22.  Last CBC Lab Results  Component Value Date   WBC 4.6 12/23/2021   HGB 13.1 12/23/2021   HCT 39.5 12/23/2021   MCV 90.4 12/23/2021   MCH 30.0 12/23/2021   RDW 12.7 12/23/2021   PLT 311 19/62/2297   Last metabolic panel Lab Results  Component Value Date   GLUCOSE 91 12/23/2021   NA 141 12/23/2021   K 4.3 12/23/2021   CL 105 12/23/2021   CO2 30 12/23/2021   BUN 9 12/23/2021   CREATININE 0.57 12/23/2021   EGFR 100 12/23/2021   CALCIUM 9.9 12/23/2021   PROT 6.8 12/23/2021   BILITOT 0.4 12/23/2021   AST 16 12/23/2021   ALT 14 12/23/2021   Last  lipids Lab Results  Component Value Date   CHOL 149 12/24/2021   HDL 61 12/24/2021   LDLCALC 73 12/24/2021   TRIG 81 12/24/2021   CHOLHDL 2.4 12/24/2021   Last hemoglobin A1c No results found for: "HGBA1C"    The 10-year ASCVD risk score (Arnett DK, et al., 2019) is: 4.4%    Assessment & Plan:  Patient was seen in the emergency department 5 days ago for diarrhea, hypokalemia, UTI and gastritis.  She was placed on Cipro, hydration advised to follow-up if symptoms did not resolve.  In clinic today patient reports weakness and fatigue.  Advised patient to continue hydration replenishing electrolytes, bland diet recommended.  Probiotic to replace stomach flora.  CT completed in emergency department which showed  abnormal swelling in patient's colon, further testing is needed.  Patient knows to call back after getting better in about 2 weeks  to schedule a colonoscopy.  Referral will be completed and patient will be notified. Problem List Items Addressed This Visit   None Visit Diagnoses     Diarrhea, unspecified type    -  Primary       Return if symptoms worsen or fail to improve.    Ivy Lynn, NP

## 2022-05-07 ENCOUNTER — Other Ambulatory Visit: Payer: Self-pay | Admitting: *Deleted

## 2022-05-07 MED ORDER — ALBUTEROL SULFATE HFA 108 (90 BASE) MCG/ACT IN AERS: 2.0000 | INHALATION_SPRAY | Freq: Four times a day (QID) | RESPIRATORY_TRACT | 5 refills | Status: DC | PRN

## 2022-05-11 DIAGNOSIS — M542 Cervicalgia: Secondary | ICD-10-CM | POA: Diagnosis not present

## 2022-05-11 DIAGNOSIS — M9901 Segmental and somatic dysfunction of cervical region: Secondary | ICD-10-CM | POA: Diagnosis not present

## 2022-05-11 DIAGNOSIS — M546 Pain in thoracic spine: Secondary | ICD-10-CM | POA: Diagnosis not present

## 2022-05-11 DIAGNOSIS — M9902 Segmental and somatic dysfunction of thoracic region: Secondary | ICD-10-CM | POA: Diagnosis not present

## 2022-05-11 DIAGNOSIS — M9903 Segmental and somatic dysfunction of lumbar region: Secondary | ICD-10-CM | POA: Diagnosis not present

## 2022-05-18 ENCOUNTER — Telehealth: Payer: Self-pay | Admitting: Nurse Practitioner

## 2022-05-18 ENCOUNTER — Other Ambulatory Visit: Payer: Self-pay | Admitting: Nurse Practitioner

## 2022-05-18 DIAGNOSIS — R935 Abnormal findings on diagnostic imaging of other abdominal regions, including retroperitoneum: Secondary | ICD-10-CM

## 2022-05-18 NOTE — Telephone Encounter (Signed)
GI referral completed for colonoscopy.

## 2022-05-18 NOTE — Telephone Encounter (Signed)
Patient aware.

## 2022-05-18 NOTE — Telephone Encounter (Signed)
REFERRAL REQUEST Telephone Note  Have you been seen at our office for this problem? yes (Advise that they may need an appointment with their PCP before a referral can be done)  Reason for Referral: thickening in colon Referral discussed with patient: yes on July 3  Best contact number of patient for referral team: [phone]    Has patient been seen by a specialist for this issue before: no  Patient provider preference for referral: na Patient location preference for referral: na   Patient notified that referrals can take up to a week or longer to process. If they haven't heard anything within a week they should call back and speak with the referral department.

## 2022-05-20 ENCOUNTER — Encounter: Payer: Self-pay | Admitting: Internal Medicine

## 2022-05-25 DIAGNOSIS — M9903 Segmental and somatic dysfunction of lumbar region: Secondary | ICD-10-CM | POA: Diagnosis not present

## 2022-05-25 DIAGNOSIS — M9902 Segmental and somatic dysfunction of thoracic region: Secondary | ICD-10-CM | POA: Diagnosis not present

## 2022-05-25 DIAGNOSIS — M546 Pain in thoracic spine: Secondary | ICD-10-CM | POA: Diagnosis not present

## 2022-05-25 DIAGNOSIS — M542 Cervicalgia: Secondary | ICD-10-CM | POA: Diagnosis not present

## 2022-05-25 DIAGNOSIS — M9901 Segmental and somatic dysfunction of cervical region: Secondary | ICD-10-CM | POA: Diagnosis not present

## 2022-06-07 NOTE — Progress Notes (Signed)
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     History of Present Illness  01/05/2022  Pulmonary/ 1st office eval/Rich[facility] Pherigo  Chief Complaint  Patient presents with   Pulmonary Consult    Referred by Kristin Eve, NP for eval of Asthma. She moved to [facility] from [place] in Sept 2022- needs to est with pulm for Asthma. She states her voice is hoarse and has dry cough.   Dyspnea:  walks with cane limited by weakness / dysequilibrium  Cough: minimal dry cough  Sleep: able to lie flat bed on side with one pillow SABA use: rarely  Rec Plan A = Automatic = Always=    Symbicort 160 1-2 puffs every 12 hours and montelukast each evening along with protonix (pantoprazole) 40 mg Take 30-60 min before first meal of the day  Plan B = Backup (to supplement plan A, not to replace it) Only use your albuterol inhaler as a rescue medication Work on inhaler technique:   GERD diet reviewed, bed blocks rec  Please schedule a follow up office visit in 6 weeks, call sooner if needed with all medications /inhalers/ solutions in hand       02/26/2022  f/u ov/[name] re: ? Asthma  maint on symbicort  160 one twice daily / singulair / ppi   Chief Complaint  Patient presents with   Follow-up   Dyspnea:  Not limited by breathing from desired activities   Cough:  a little raspy now and then / no problem with spring allergies since stopped dairy products  Sleeping: able to lie flat one pillow  SABA use: no rescue  02: not  Covid status:   vax x 4  Rec Plan A  = Automatic = Always=    Symbicort 160 1-2 every 12 hours  Plan B = Backup (to supplement plan A, not to replace it) Only use your albuterol inhaler as a rescue medication     06/08/2022  f/u ov/[name] re: ? Asthma   maint on symbicort  160 one bid, worse with smoke exp from [place]/ never increased symb dose to 2bid as rec  Chief Complaint  Patient presents with   Follow-up    "I feel like I am breathless off and on"- started 3 years ago. She gets light headed when she bends over.   Dyspnea:  Not limited by breathing from desired activities  /  Cough: none Sleeping: flat surface/ one pillow  SABA use: none  02: none  Covid status:   vax x 4    No obvious day to day or daytime variability or assoc excess/ purulent sputum or mucus plugs or hemoptysis or cp or chest tightness, subjective wheeze or overt sinus or hb symptoms.    without nocturnal  or early am exacerbation  of respiratory  c/o's or need for noct saba. Also denies any obvious fluctuation of symptoms with weather or environmental changes or other aggravating or alleviating factors except as outlined above   No unusual  exposure hx or h/o childhood pna/ asthma or knowledge of premature birth.  Current Allergies, Complete Past Medical History, Past Surgical History, Family History, and Social History were reviewed in [employer] record.  ROS  The following are not active complaints unless bolded Hoarseness, sore throat, dysphagia, dental problems, itching, sneezing,  nasal congestion or discharge of excess mucus or purulent secretions, ear ache,   fever, chills, sweats, unintended wt loss or wt gain, classically pleuritic or exertional cp,  orthopnea pnd or arm/hand swelling  or leg swelling, presyncope, palpitations, abdominal pain, anorexia, nausea, vomiting, diarrhea  or change in bowel habits or change in bladder habits, change in stools or change in urine, dysuria, hematuria,  rash, arthralgias, visual  complaints, headache, numbness, weakness or ataxia or problems with walking or coordination,  change in mood or  memory.        Current Meds  Medication Sig   albuterol (VENTOLIN HFA) 108 (90 Base) MCG/ACT inhaler Inhale 2 puffs into the lungs every 6 (six) hours as needed for wheezing or shortness of breath.   Ascorbic Acid (VITAMIN C) 1000 MG tablet Take 1,000 mg by mouth in the morning and at bedtime.   budesonide-formoterol (SYMBICORT) 160-4.5 MCG/ACT inhaler Inhale 1 puff into the lungs 2 (two) times daily.   Calcium Carb-Cholecalciferol (CALCIUM 600+D3 PO) Take 1 each by mouth every morning.   Cholecalciferol (VITAMIN D3) 125 MCG (5000 UT) CAPS Take 1 each by mouth in the morning.   CINNAMON PO Take by mouth.   Cyanocobalamin (B-12) 2500 MCG TABS Take 1 each by mouth in the morning.   cycloSPORINE (RESTASIS) 0.05 % ophthalmic emulsion 1 drop 2 (two) times daily.   hydroxychloroquine (PLAQUENIL) 200 MG tablet Take 1 tablet (200 mg total) by mouth 2 (two) times daily.   montelukast (SINGULAIR) 10 MG tablet TAKE 1 TABLET BY MOUTH AT BEDTIME   Multiple Vitamins-Minerals (MULTIVITAMIN WOMEN 50+) TABS Take 1 tablet by mouth daily.   pantoprazole (PROTONIX) 40 MG tablet Take 1 tablet by mouth once daily (Patient taking differently: Take 40 mg by mouth 2 (two) times daily.)   pregabalin (LYRICA) 75 MG capsule Take 75 mg by mouth 2 (two) times daily.   PSYLLIUM HUSK PO Take by mouth.   raloxifene (EVISTA) 60 MG tablet Take 1 tablet by mouth once daily   TURMERIC CURCUMIN PO Take 1 capsule by mouth in the morning and at bedtime.   UNABLE TO FIND Take 2 each by mouth 2 (two) times daily. Med Name: HydroEye                Past Medical History:  Diagnosis Date   Arthritis    erosive osteoarthritis   Asthma    Atrial fibrillation (HCC)    Chronically dry eyes    Fatigue    chronic ( and weakness)   GERD (gastroesophageal reflux disease)    Neuromuscular disorder (HCC)    neuropathy    Neuropathy    legs and feet   Osteoporosis        Objective:     06/08/2022         124   02/26/22 126 lb 12.8 oz (57.5 kg)  01/15/22 121 lb (54.9 kg)  01/05/22 121 lb (54.9 kg)      Vital signs reviewed  06/08/2022  - Note at rest 02 sats  98% on RA   General appearance:    amb wf nad    HEENT : Oropharynx  clear  Nasal turbinates nl    NECK :  without  apparent JVD/ palpable Nodes/TM    LUNGS: no acc muscle use,  Nl contour chest which is clear to A and P bilaterally without cough on insp or exp maneuvers   CV:  RRR  no s3 or murmur or increase in P2, and no edema   ABD:  soft and nontender with nl inspiratory excursion in the supine position. No bruits or organomegaly appreciated   MS:  Nl gait/ ext warm without deformities Or obvious joint restrictions  calf tenderness, cyanosis or clubbing    SKIN: warm and dry without lesions    NEURO:  alert, approp, nl sensorium with  no motor or cerebellar deficits apparent.            Assessment

## 2022-06-08 ENCOUNTER — Encounter: Payer: Self-pay | Admitting: Internal Medicine

## 2022-06-08 ENCOUNTER — Ambulatory Visit (INDEPENDENT_AMBULATORY_CARE_PROVIDER_SITE_OTHER): Payer: Medicare Other | Admitting: Internal Medicine

## 2022-06-08 ENCOUNTER — Other Ambulatory Visit: Payer: Self-pay | Admitting: Internal Medicine

## 2022-06-08 DIAGNOSIS — J4520 Mild intermittent asthma, uncomplicated: Secondary | ICD-10-CM

## 2022-06-08 DIAGNOSIS — M154 Erosive (osteo)arthritis: Secondary | ICD-10-CM

## 2022-06-08 NOTE — Telephone Encounter (Signed)
Next Visit: 09/29/2022  Last Visit: 03/25/2022  Last Fill: 12/23/2021  DX: erosive OA and osteoporosis  Current Dose per office note 03/25/2022: HCQ 400 mg per day. PLQ Eye Exam 03/04/2022  Labs: 05/02/2022 CBC WNL except Absolute Lymphocytes 0.5 CMP Sodium LOW 134 Potassium LOW 3.0 BUN LOW 7 Albumin LOW 3.1   Okay to refill HCQ?

## 2022-06-08 NOTE — Patient Instructions (Addendum)
Plan A = Automatic = Always=    Symbicort 160 1-2 puffs every 12 hours (if worse use 2 puffs)  Plan B = Backup (to supplement plan A, not to replace it) Only use your albuterol inhaler as a rescue medication to be used if you can't catch your breath by resting or doing a relaxed purse lip breathing pattern.  - The less you use it, the better it will work when you need it. - Ok to use the inhaler up to 2 puffs  every 4 hours if you must but call for appointment if use goes up over your usual need - Don't leave home without it !!  (think of it like the spare tire for your car)     Please schedule a follow up visit in 6  months but call sooner if needed

## 2022-06-08 NOTE — Assessment & Plan Note (Signed)
Onset around 2017 better on symbicort 160 but then more hoarse - 12/23/21   Eos 101  (0.1)  - 01/05/2022  After extensive coaching inhaler device,  effectiveness =   75% from a baseline of 25% so continue symbicort 160 but try 1-2 bid   - The proper method of use, as well as anticipated side effects, of a metered-dose inhaler were discussed and demonstrated to the patient using teach back method.    At present: All goals of chronic asthma control met including optimal function and elimination of symptoms with minimal need for rescue therapy.  Contingencies discussed in full including contacting this office immediately if not controlling the symptoms using the rule of two's.     Advised : Based on two studies from NEJM  378; 20 p 1865 (2018) and 380 : p2020-30 (2019) in pts with mild asthma it is reasonable to use low dose symbicort eg 80 2bid "prn" flare in this setting but I emphasized this was only shown with symbicort and takes advantage of the rapid onset of action but is not the same as "rescue therapy" but can be stopped once the acute symptoms have resolved and the need for rescue has been minimized (< 2 x weekly)    F/u can be q 6 m, call sooner if needed

## 2022-06-10 ENCOUNTER — Telehealth: Payer: Self-pay | Admitting: *Deleted

## 2022-06-10 ENCOUNTER — Ambulatory Visit (INDEPENDENT_AMBULATORY_CARE_PROVIDER_SITE_OTHER): Payer: Medicare Other | Admitting: Gastroenterology

## 2022-06-10 ENCOUNTER — Telehealth: Payer: Self-pay | Admitting: Internal Medicine

## 2022-06-10 ENCOUNTER — Encounter: Payer: Self-pay | Admitting: *Deleted

## 2022-06-10 ENCOUNTER — Encounter: Payer: Self-pay | Admitting: Gastroenterology

## 2022-06-10 DIAGNOSIS — R935 Abnormal findings on diagnostic imaging of other abdominal regions, including retroperitoneum: Secondary | ICD-10-CM | POA: Diagnosis not present

## 2022-06-10 MED ORDER — PEG 3350-KCL-NA BICARB-NACL 420 G PO SOLR: 4000.0000 mL | Freq: Once | ORAL | 0 refills | Status: AC

## 2022-06-10 NOTE — Progress Notes (Signed)
Gastroenterology Office Note    Referring Provider: Ivy Lynn, NP Primary Care Physician:  Ivy Lynn, NP  Primary GI: Dr. Abbey Chatters , previously established in [place]    Chief Complaint   Chief Complaint  Patient presents with   Ed visit    Patient here today as a ed [facility]) follow up 05/02/2022, due to left lower abdominal pain. Was told she had an abnormal Ct scan.     History of Present Illness   Kristin Wallace is a delightful 66 y.o. female presenting today at the request of Ijaola, Onyeje M, NP due to abnormal CT scan on May 02, 2022 at [facility]. Patient previously lived up [place] but now resides in [place], [place]. Last colonoscopy reportedly 3-4 years ago with history of polyps.   She notes she had gone to [facility] on a trip, came home and had pressure and then associated LLQ discomfort. Acute onset of diarrhea with urgency. Profuse diarrhea. Watery diarrhea. Associated LLQ pain. She presented to [facility] whereby a CT was done showing mass-like thickening of the cecum, colonic diverticulosis but no diverticulitis. She also had ill-defined low-attenuation in central aspect of the liver, favored to reflect focal periportal edema. LFTs normal. No leukocytosis.   She is now back to her baseline. No further LLQ discomfort. Takes psyllium husk each morning. Helps to regulate her bowels. Will usually empty her bowels in the morning and then go 2 more times and feel emptied out. Symptoms resolved.  No FH of colon cancer. Stress will worsen frequency of stool.   Notes chronic history of GERD and underwent EGD/dilation in the past up [place]. Takes PPI daily.      Past Medical History:  Diagnosis Date   Arthritis    erosive osteoarthritis   Asthma    Atrial fibrillation (HCC)    Chronically dry eyes    Fatigue    chronic ( and weakness)   GERD (gastroesophageal reflux disease)    Lyme disease    Neuromuscular disorder (HCC)    neuropathy    Neuropathy    legs and feet   Osteoporosis     Past Surgical History:  Procedure Laterality Date   ABDOMINAL HYSTERECTOMY     BREAST SURGERY Left    neg tumor   RIGHT AND LEFT HEART CATH     TUBAL LIGATION      Current Outpatient Medications  Medication Sig Dispense Refill   albuterol (VENTOLIN HFA) 108 (90 Base) MCG/ACT inhaler Inhale 2 puffs into the lungs every 6 (six) hours as needed for wheezing or shortness of breath. 18 g 5   Ascorbic Acid (VITAMIN C) 1000 MG tablet Take 1,000 mg by mouth in the morning and at bedtime.     budesonide-formoterol (SYMBICORT) 160-4.5 MCG/ACT inhaler Inhale 1 puff into the lungs 2 (two) times daily.     Calcium Carb-Cholecalciferol (CALCIUM 600+D3 PO) Take 1 each by mouth every morning.     Cholecalciferol (VITAMIN D3) 125 MCG (5000 UT) CAPS Take 1 each by mouth in the morning.     CINNAMON PO Take by mouth.     Cyanocobalamin (B-12) 2500 MCG TABS Take 1 each by mouth in the morning and at bedtime.     cycloSPORINE (RESTASIS) 0.05 % ophthalmic emulsion 1 drop 2 (two) times daily.     hydroxychloroquine (PLAQUENIL) 200 MG tablet Take 1 tablet by mouth twice daily 180 tablet 1   montelukast (SINGULAIR) 10 MG tablet  TAKE 1 TABLET BY MOUTH AT BEDTIME 30 tablet 5   Multiple Vitamins-Minerals (MULTIVITAMIN WOMEN 50+) TABS Take 1 tablet by mouth daily.     OVER THE COUNTER MEDICATION Science health dry eye formula three capsule daily.     pantoprazole (PROTONIX) 40 MG tablet Take 1 tablet by mouth once daily (Patient taking differently: Take 40 mg by mouth 2 (two) times daily.) 90 tablet 0   pregabalin (LYRICA) 75 MG capsule Take 75 mg by mouth 2 (two) times daily.     PSYLLIUM HUSK PO Take by mouth.     raloxifene (EVISTA) 60 MG tablet Take 1 tablet by mouth once daily 30 tablet 9   TURMERIC CURCUMIN PO Take 1 capsule by mouth in the morning and at bedtime.     No current facility-administered medications for this visit.    Allergies as of  06/10/2022 - Review Complete 06/10/2022  Allergen Reaction Noted   Shellfish allergy  10/13/2021    Family History  Problem Relation Age of Onset   Depression Mother    Other Mother        suicide   Other Father        plane crash   Heart disease Sister    Cancer Brother    Cancer Brother        skin cancer   Depression Daughter    Thyroid disease Daughter    Migraines Daughter    Colon cancer Neg Hx     Social History   Socioeconomic History   Marital status: Divorced    Spouse name: Not on file   Number of children: 3   Years of education: Not on file   Highest education level: Not on file  Occupational History   Occupation: retired  Tobacco Use   Smoking status: Never   Smokeless tobacco: Never  Vaping Use   Vaping Use: Never used  Substance and Sexual Activity   Alcohol use: Yes    Comment: 3 yearly   Drug use: Never   Sexual activity: Not on file  Other Topics Concern   Not on file  Social History Narrative   Moved here from [place] this year 07/27/2021.   Daughter lives with her   Right handed   Social Determinants of Health   Financial Resource Strain: Medium Risk (11/25/2021)   Overall Financial Resource Strain (CARDIA)    Difficulty of Paying Living Expenses: Somewhat hard  Food Insecurity: Food Insecurity Present (12/03/2021)   Hunger Vital Sign    Worried About Running Out of Food in the Last Year: Often true    Ran Out of Food in the Last Year: Sometimes true  Transportation Needs: No Transportation Needs (11/25/2021)   PRAPARE - Hydrologist (Medical): No    Lack of Transportation (Non-Medical): No  Physical Activity: Insufficiently Active (11/25/2021)   Exercise Vital Sign    Days of Exercise per Week: 3 days    Minutes of Exercise per Session: 30 min  Stress: No Stress Concern Present (11/25/2021)   [place]    Feeling of Stress : Only a little   Social Connections: Moderately Integrated (11/25/2021)   Social Connection and Isolation Panel [NHANES]    Frequency of Communication with Friends and Family: More than three times a week    Frequency of Social Gatherings with Friends and Family: Once a week    Attends Religious Services: More than 4 times per  year    Active Member of Clubs or Organizations: Yes    Attends Archivist Meetings: More than 4 times per year    Marital Status: Divorced  Intimate Partner Violence: Not At Risk (11/25/2021)   Humiliation, Afraid, Rape, and Kick questionnaire    Fear of Current or Ex-Partner: No    Emotionally Abused: No    Physically Abused: No    Sexually Abused: No     Review of Systems   Gen: Denies any fever, chills, fatigue, weight loss, lack of appetite.  CV: Denies chest pain, heart palpitations, peripheral edema, syncope.  Resp: Denies shortness of breath at rest or with exertion. Denies wheezing or cough.  GI: see HPI GU : Denies urinary burning, urinary frequency, urinary hesitancy MS: Denies joint pain, muscle weakness, cramps, or limitation of movement.  Derm: Denies rash, itching, dry skin Psych: Denies depression, anxiety, memory loss, and confusion Heme: Denies bruising, bleeding, and enlarged lymph nodes.   Physical Exam   BP 120/68 (BP Location: Left Arm, Patient Position: Sitting, Cuff Size: Small)   Pulse 69   Temp 97.7 F (36.5 C) (Oral)   Ht '5\' 5"'$  (1.651 m)   Wt 124 lb 9.6 oz (56.5 kg)   BMI 20.73 kg/m  General:   Alert and oriented. Pleasant and cooperative. Well-nourished and well-developed.  Head:  Normocephalic and atraumatic. Eyes:  Without icterus Ears:  Normal auditory acuity. Lungs:  Clear to auscultation bilaterally.  Heart:  S1, S2 present without murmurs appreciated.  Abdomen:  +BS, soft, mild TTP LLQ and non-distended. No HSM noted. No guarding or rebound. No masses appreciated.  Rectal:  Deferred  Msk:  Symmetrical without gross  deformities. Normal posture. Extremities:  Without edema. Neurologic:  Alert and  oriented x4;  grossly normal neurologically. Skin:  Intact without significant lesions or rashes. Psych:  Alert and cooperative. Normal mood and affect.   Assessment   Kristin Wallace is a pleasant 66 y.o. female presenting today with at the request of Ivy Lynn, NP due to abnormal CT scan on May 02, 2022 at [facility]. Patient previously lived up [place] but now resides in [place], [place]. Last colonoscopy reportedly 3-4 years ago with history of polyps.    Clinical presentation in July most consistent with an acute gastroenteritis that was self-limiting. She is back to baseline bowel habits now. However, she did have mass-like thickening of the cecum on CT. She will need diagnostic colonoscopy int he near future. No FH of colon cancer.     PLAN   Proceed with colonoscopy by Dr. Abbey Chatters  in near future: the risks, benefits, and alternatives have been discussed with the patient in detail. The patient states understanding and desires to proceed.   Continue fiber daily  Further recommendations to follow  Annitta Needs, PhD, ANP-BC [facility] Gastroenterology

## 2022-06-10 NOTE — Telephone Encounter (Signed)
Patient wants to change providers from Dr.Wert to another [facility] office provider.   Dr. Melvyn Novas are you okay with this?   Dr. Halford Chessman or Dr. Elsworth Soho would either of you be okay with taking this patient?

## 2022-06-10 NOTE — Telephone Encounter (Signed)
Okay with me 

## 2022-06-10 NOTE — Telephone Encounter (Signed)
Pt left vm wanting reschedule appt on 06/24/22 at 1:15 pm..  LMOVM to call back to reschedule appt.

## 2022-06-10 NOTE — Telephone Encounter (Signed)
Called pt. Scheduled colonoscopy with Dr. Abbey Chatters on 8/28 at 1:15pm. Aware will mail instructions to her. Rx for prep sent to pharmacy.   PA submitted via [place]. Auth# [id number], DOS:Jun 29, 2022 - Sep 27, 2022

## 2022-06-10 NOTE — Telephone Encounter (Signed)
ATC Izora Gala to notify that approval to switch to Dr. Halford Chessman was approved. LVM letting patient know to call back and schedule a new patient appt in [facility] with Dr. Halford Chessman.

## 2022-06-10 NOTE — Patient Instructions (Signed)
We are arranging a colonoscopy in the near future with Dr. Abbey Chatters!  It was good to meet you today! Further recommendations to follow!  It was a pleasure to see you today. I want to create trusting relationships with patients to provide genuine, compassionate, and quality care. I value your feedback. If you receive a survey regarding your visit,  I greatly appreciate you taking time to fill this out.   Annitta Needs, PhD, ANP-BC [facility] Gastroenterology

## 2022-06-10 NOTE — H&P (View-Only) (Signed)
Gastroenterology Office Note    Referring Provider: Ivy Lynn, NP Primary Care Physician:  Ivy Lynn, NP  Primary GI: Dr. Abbey Chatters , previously established in [place]    Chief Complaint   Chief Complaint  Patient presents with   Ed visit    Patient here today as a ed [facility]) follow up 05/02/2022, due to left lower abdominal pain. Was told she had an abnormal Ct scan.     History of Present Illness   Kristin Wallace is a delightful 66 y.o. female presenting today at the request of Ijaola, Onyeje M, NP due to abnormal CT scan on May 02, 2022 at [facility]. Patient previously lived up [place] but now resides in [place], [place]. Last colonoscopy reportedly 3-4 years ago with history of polyps.   She notes she had gone to [facility] on a trip, came home and had pressure and then associated LLQ discomfort. Acute onset of diarrhea with urgency. Profuse diarrhea. Watery diarrhea. Associated LLQ pain. She presented to [facility] whereby a CT was done showing mass-like thickening of the cecum, colonic diverticulosis but no diverticulitis. She also had ill-defined low-attenuation in central aspect of the liver, favored to reflect focal periportal edema. LFTs normal. No leukocytosis.   She is now back to her baseline. No further LLQ discomfort. Takes psyllium husk each morning. Helps to regulate her bowels. Will usually empty her bowels in the morning and then go 2 more times and feel emptied out. Symptoms resolved.  No FH of colon cancer. Stress will worsen frequency of stool.   Notes chronic history of GERD and underwent EGD/dilation in the past up [place]. Takes PPI daily.      Past Medical History:  Diagnosis Date   Arthritis    erosive osteoarthritis   Asthma    Atrial fibrillation (HCC)    Chronically dry eyes    Fatigue    chronic ( and weakness)   GERD (gastroesophageal reflux disease)    Lyme disease    Neuromuscular disorder (HCC)    neuropathy    Neuropathy    legs and feet   Osteoporosis     Past Surgical History:  Procedure Laterality Date   ABDOMINAL HYSTERECTOMY     BREAST SURGERY Left    neg tumor   RIGHT AND LEFT HEART CATH     TUBAL LIGATION      Current Outpatient Medications  Medication Sig Dispense Refill   albuterol (VENTOLIN HFA) 108 (90 Base) MCG/ACT inhaler Inhale 2 puffs into the lungs every 6 (six) hours as needed for wheezing or shortness of breath. 18 g 5   Ascorbic Acid (VITAMIN C) 1000 MG tablet Take 1,000 mg by mouth in the morning and at bedtime.     budesonide-formoterol (SYMBICORT) 160-4.5 MCG/ACT inhaler Inhale 1 puff into the lungs 2 (two) times daily.     Calcium Carb-Cholecalciferol (CALCIUM 600+D3 PO) Take 1 each by mouth every morning.     Cholecalciferol (VITAMIN D3) 125 MCG (5000 UT) CAPS Take 1 each by mouth in the morning.     CINNAMON PO Take by mouth.     Cyanocobalamin (B-12) 2500 MCG TABS Take 1 each by mouth in the morning and at bedtime.     cycloSPORINE (RESTASIS) 0.05 % ophthalmic emulsion 1 drop 2 (two) times daily.     hydroxychloroquine (PLAQUENIL) 200 MG tablet Take 1 tablet by mouth twice daily 180 tablet 1   montelukast (SINGULAIR) 10 MG tablet  TAKE 1 TABLET BY MOUTH AT BEDTIME 30 tablet 5   Multiple Vitamins-Minerals (MULTIVITAMIN WOMEN 50+) TABS Take 1 tablet by mouth daily.     OVER THE COUNTER MEDICATION Science health dry eye formula three capsule daily.     pantoprazole (PROTONIX) 40 MG tablet Take 1 tablet by mouth once daily (Patient taking differently: Take 40 mg by mouth 2 (two) times daily.) 90 tablet 0   pregabalin (LYRICA) 75 MG capsule Take 75 mg by mouth 2 (two) times daily.     PSYLLIUM HUSK PO Take by mouth.     raloxifene (EVISTA) 60 MG tablet Take 1 tablet by mouth once daily 30 tablet 9   TURMERIC CURCUMIN PO Take 1 capsule by mouth in the morning and at bedtime.     No current facility-administered medications for this visit.    Allergies as of  06/10/2022 - Review Complete 06/10/2022  Allergen Reaction Noted   Shellfish allergy  10/13/2021    Family History  Problem Relation Age of Onset   Depression Mother    Other Mother        suicide   Other Father        plane crash   Heart disease Sister    Cancer Brother    Cancer Brother        skin cancer   Depression Daughter    Thyroid disease Daughter    Migraines Daughter    Colon cancer Neg Hx     Social History   Socioeconomic History   Marital status: Divorced    Spouse name: Not on file   Number of children: 3   Years of education: Not on file   Highest education level: Not on file  Occupational History   Occupation: retired  Tobacco Use   Smoking status: Never   Smokeless tobacco: Never  Vaping Use   Vaping Use: Never used  Substance and Sexual Activity   Alcohol use: Yes    Comment: 3 yearly   Drug use: Never   Sexual activity: Not on file  Other Topics Concern   Not on file  Social History Narrative   Moved here from [place] this year 07/27/2021.   Daughter lives with her   Right handed   Social Determinants of Health   Financial Resource Strain: Medium Risk (11/25/2021)   Overall Financial Resource Strain (CARDIA)    Difficulty of Paying Living Expenses: Somewhat hard  Food Insecurity: Food Insecurity Present (12/03/2021)   Hunger Vital Sign    Worried About Running Out of Food in the Last Year: Often true    Ran Out of Food in the Last Year: Sometimes true  Transportation Needs: No Transportation Needs (11/25/2021)   PRAPARE - Hydrologist (Medical): No    Lack of Transportation (Non-Medical): No  Physical Activity: Insufficiently Active (11/25/2021)   Exercise Vital Sign    Days of Exercise per Week: 3 days    Minutes of Exercise per Session: 30 min  Stress: No Stress Concern Present (11/25/2021)   [place]    Feeling of Stress : Only a little   Social Connections: Moderately Integrated (11/25/2021)   Social Connection and Isolation Panel [NHANES]    Frequency of Communication with Friends and Family: More than three times a week    Frequency of Social Gatherings with Friends and Family: Once a week    Attends Religious Services: More than 4 times per  year    Active Member of Clubs or Organizations: Yes    Attends Archivist Meetings: More than 4 times per year    Marital Status: Divorced  Intimate Partner Violence: Not At Risk (11/25/2021)   Humiliation, Afraid, Rape, and Kick questionnaire    Fear of Current or Ex-Partner: No    Emotionally Abused: No    Physically Abused: No    Sexually Abused: No     Review of Systems   Gen: Denies any fever, chills, fatigue, weight loss, lack of appetite.  CV: Denies chest pain, heart palpitations, peripheral edema, syncope.  Resp: Denies shortness of breath at rest or with exertion. Denies wheezing or cough.  GI: see HPI GU : Denies urinary burning, urinary frequency, urinary hesitancy MS: Denies joint pain, muscle weakness, cramps, or limitation of movement.  Derm: Denies rash, itching, dry skin Psych: Denies depression, anxiety, memory loss, and confusion Heme: Denies bruising, bleeding, and enlarged lymph nodes.   Physical Exam   BP 120/68 (BP Location: Left Arm, Patient Position: Sitting, Cuff Size: Small)   Pulse 69   Temp 97.7 F (36.5 C) (Oral)   Ht '5\' 5"'$  (1.651 m)   Wt 124 lb 9.6 oz (56.5 kg)   BMI 20.73 kg/m  General:   Alert and oriented. Pleasant and cooperative. Well-nourished and well-developed.  Head:  Normocephalic and atraumatic. Eyes:  Without icterus Ears:  Normal auditory acuity. Lungs:  Clear to auscultation bilaterally.  Heart:  S1, S2 present without murmurs appreciated.  Abdomen:  +BS, soft, mild TTP LLQ and non-distended. No HSM noted. No guarding or rebound. No masses appreciated.  Rectal:  Deferred  Msk:  Symmetrical without gross  deformities. Normal posture. Extremities:  Without edema. Neurologic:  Alert and  oriented x4;  grossly normal neurologically. Skin:  Intact without significant lesions or rashes. Psych:  Alert and cooperative. Normal mood and affect.   Assessment   Kristin Wallace is a pleasant 66 y.o. female presenting today with at the request of Ivy Lynn, NP due to abnormal CT scan on May 02, 2022 at [facility]. Patient previously lived up [place] but now resides in [place], [place]. Last colonoscopy reportedly 3-4 years ago with history of polyps.    Clinical presentation in July most consistent with an acute gastroenteritis that was self-limiting. She is back to baseline bowel habits now. However, she did have mass-like thickening of the cecum on CT. She will need diagnostic colonoscopy int he near future. No FH of colon cancer.     PLAN   Proceed with colonoscopy by Dr. Abbey Chatters  in near future: the risks, benefits, and alternatives have been discussed with the patient in detail. The patient states understanding and desires to proceed.   Continue fiber daily  Further recommendations to follow  Annitta Needs, PhD, ANP-BC [facility] Gastroenterology

## 2022-06-10 NOTE — Telephone Encounter (Signed)
Fine with me

## 2022-06-11 ENCOUNTER — Encounter: Payer: Self-pay | Admitting: *Deleted

## 2022-06-11 NOTE — Telephone Encounter (Signed)
Patient called back and rescheduled appt until 07/07/22 at 1:15pm

## 2022-06-16 DIAGNOSIS — S61212A Laceration without foreign body of right middle finger without damage to nail, initial encounter: Secondary | ICD-10-CM | POA: Diagnosis not present

## 2022-06-16 DIAGNOSIS — Z23 Encounter for immunization: Secondary | ICD-10-CM | POA: Diagnosis not present

## 2022-06-23 ENCOUNTER — Encounter: Payer: Self-pay | Admitting: Nurse Practitioner

## 2022-06-23 ENCOUNTER — Ambulatory Visit (INDEPENDENT_AMBULATORY_CARE_PROVIDER_SITE_OTHER): Payer: Medicare Other | Admitting: Nurse Practitioner

## 2022-06-23 VITALS — BP 112/72 | HR 80 | Temp 97.4°F | Ht 65.0 in | Wt 125.0 lb

## 2022-06-23 DIAGNOSIS — R5383 Other fatigue: Secondary | ICD-10-CM

## 2022-06-23 DIAGNOSIS — R6889 Other general symptoms and signs: Secondary | ICD-10-CM | POA: Diagnosis not present

## 2022-06-23 NOTE — Patient Instructions (Signed)
Fatigue If you have fatigue, you feel tired all the time and have a lack of energy or a lack of motivation. Fatigue may make it difficult to start or complete tasks because of exhaustion. Occasional or mild fatigue is often a normal response to activity or life. However, long-term (chronic) or extreme fatigue may be a symptom of a medical condition such as: Depression. Not having enough red blood cells or hemoglobin in the blood (anemia). A problem with a small gland located in the lower front part of the neck (thyroid disorder). Rheumatologic conditions. These are problems related to the body's defense system (immune system). Infections, especially certain viral infections. Fatigue can also lead to negative health outcomes over time. Follow these instructions at home: Medicines Take over-the-counter and prescription medicines only as told by your health care provider. Take a multivitamin if told by your health care provider. Do not use herbal or dietary supplements unless they are approved by your health care provider. Eating and drinking  Avoid heavy meals in the evening. Eat a well-balanced diet, which includes lean proteins, whole grains, plenty of fruits and vegetables, and low-fat dairy products. Avoid eating or drinking too many products with caffeine in them. Avoid alcohol. Drink enough fluid to keep your urine pale yellow. Activity  Exercise regularly, as told by your health care provider. Use or practice techniques to help you relax, such as yoga, tai chi, meditation, or massage therapy. Lifestyle Change situations that cause you stress. Try to keep your work and personal schedules in balance. Do not use recreational or illegal drugs. General instructions Monitor your fatigue for any changes. Go to bed and get up at the same time every day. Avoid fatigue by pacing yourself during the day and getting enough sleep at night. Maintain a healthy weight. Contact a health care  provider if: Your fatigue does not get better. You have a fever. You suddenly lose or gain weight. You have headaches. You have trouble falling asleep or sleeping through the night. You feel angry, guilty, anxious, or sad. You have swelling in your legs or another part of your body. Get help right away if: You feel confused, feel like you might faint, or faint. Your vision is blurry or you have a severe headache. You have severe pain in your abdomen, your back, or the area between your waist and hips (pelvis). You have chest pain, shortness of breath, or an irregular or fast heartbeat. You are unable to urinate, or you urinate less than normal. You have abnormal bleeding from the rectum, nose, lungs, nipples, or, if you are female, the vagina. You vomit blood. You have thoughts about hurting yourself or others. These symptoms may be an emergency. Get help right away. Call 911. Do not wait to see if the symptoms will go away. Do not drive yourself to the hospital. Get help right away if you feel like you may hurt yourself or others, or have thoughts about taking your own life. Go to your nearest emergency room or: Call 911. Call the [place] at [phone] or 988. This is open 24 hours a day. Text the Crisis Text Line at 979-823-0385. Summary If you have fatigue, you feel tired all the time and have a lack of energy or a lack of motivation. Fatigue may make it difficult to start or complete tasks because of exhaustion. Long-term (chronic) or extreme fatigue may be a symptom of a medical condition. Exercise regularly, as told by your health care provider.  Change situations that cause you stress. Try to keep your work and personal schedules in balance. This information is not intended to replace advice given to you by your health care provider. Make sure you discuss any questions you have with your health care provider. Document Revised: 08/11/2021 Document  Reviewed: 08/11/2021 Elsevier Patient Education  2023 [employer] Inc.  

## 2022-06-23 NOTE — Progress Notes (Signed)
Acute Office Visit  Subjective:     Patient ID: Kristin Wallace, female    DOB: Mar 25, 1956, 66 y.o.   MRN: [id number]  Chief Complaint  Patient presents with   light headed   Dizziness    Coming and going kfor years but getting worse    Dizziness Pertinent negatives include no chills, fever or rash.   Patient is in today for Fatigue  She reports recurrent fatigue which she describes as a lack of energy, feeling exhausted, and feeling weak. It began a few months ago and occurs every day. It is described as marked and gradually worsening. She has not started new medications around the time the fatigue started.   Associated symptoms: No arthralgias No bleeding  No melena No chest discomfort  No heart palpitations No heart racing   No dyspnea No feeling depressed  No feeling anxious or under stress No fevers  No loss of appetite No nausea  No vomiting No sleeping problems    Wt Readings from Last 3 Encounters:  06/23/22 125 lb (56.7 kg)  06/10/22 124 lb 9.6 oz (56.5 kg)  06/08/22 124 lb (56.2 kg)    Lab Results  Component Value Date   WBC 4.6 12/23/2021   HGB 13.1 12/23/2021   HCT 39.5 12/23/2021   MCV 90.4 12/23/2021   PLT 311 12/23/2021   Lab Results  Component Value Date   TSH 1.750 12/24/2021   Lab Results  Component Value Date   NA 141 12/23/2021   K 4.3 12/23/2021   CO2 30 12/23/2021   BUN 9 12/23/2021   CREATININE 0.57 12/23/2021   CALCIUM 9.9 12/23/2021   GLUCOSE 91 12/23/2021      Review of Systems  Constitutional:  Positive for malaise/fatigue. Negative for chills and fever.  HENT: Negative.    Eyes: Negative.   Respiratory: Negative.    Cardiovascular: Negative.   Genitourinary: Negative.   Musculoskeletal: Negative.   Skin: Negative.  Negative for itching and rash.  Neurological:  Negative for dizziness.  All other systems reviewed and are negative.       Objective:    BP 112/72   Pulse 80   Temp (!) 97.4 F (36.3 C)   Ht '5\' 5"'$   (1.651 m)   Wt 125 lb (56.7 kg)   SpO2 98%   BMI 20.80 kg/m  BP Readings from Last 3 Encounters:  06/23/22 112/72  06/10/22 120/68  06/08/22 110/68   Wt Readings from Last 3 Encounters:  06/23/22 125 lb (56.7 kg)  06/10/22 124 lb 9.6 oz (56.5 kg)  06/08/22 124 lb (56.2 kg)      Physical Exam Vitals and nursing note reviewed.  Constitutional:      Appearance: Normal appearance.  HENT:     Head: Normocephalic.     Right Ear: External ear normal.     Left Ear: External ear normal.     Nose: Nose normal.     Mouth/Throat:     Mouth: Mucous membranes are moist.  Eyes:     Conjunctiva/sclera: Conjunctivae normal.  Cardiovascular:     Rate and Rhythm: Normal rate and regular rhythm.     Pulses: Normal pulses.     Heart sounds: Normal heart sounds.  Pulmonary:     Effort: Pulmonary effort is normal.     Breath sounds: Normal breath sounds.  Abdominal:     General: Bowel sounds are normal.  Skin:    General: Skin is warm.  Coloration: Skin is not pale.     Findings: No erythema or rash.  Neurological:     General: No focal deficit present.     Mental Status: She is alert and oriented to person, place, and time.  Psychiatric:        Behavior: Behavior normal.     No results found for any visits on 06/23/22.      Assessment & Plan:  Symptoms of fatigue not well controlled in the past few months.  Repeated CBC, B12, and vitamin D to reassess causes.  Advised patient to increase hydration, stress management and sleep follow-up Problem List Items Addressed This Visit       Other   Other fatigue - Primary   Relevant Orders   Vitamin B12   Vitamin D, 25-hydroxy   CBC with Differential    No orders of the defined types were placed in this encounter.   Return if symptoms worsen or fail to improve.  Ivy Lynn, NP

## 2022-06-24 ENCOUNTER — Other Ambulatory Visit (HOSPITAL_COMMUNITY): Payer: Medicaid Other

## 2022-06-24 LAB — CBC WITH DIFFERENTIAL/PLATELET
Basophils Absolute: 0 10*3/uL (ref 0.0–0.2)
Basos: 1 %
EOS (ABSOLUTE): 0.1 10*3/uL (ref 0.0–0.4)
Eos: 2 %
Hematocrit: 37.9 % (ref 34.0–46.6)
Hemoglobin: 12.2 g/dL (ref 11.1–15.9)
Immature Grans (Abs): 0 10*3/uL (ref 0.0–0.1)
Immature Granulocytes: 0 %
Lymphocytes Absolute: 1.7 10*3/uL (ref 0.7–3.1)
Lymphs: 40 %
MCH: 29.2 pg (ref 26.6–33.0)
MCHC: 32.2 g/dL (ref 31.5–35.7)
MCV: 91 fL (ref 79–97)
Monocytes Absolute: 0.6 10*3/uL (ref 0.1–0.9)
Monocytes: 13 %
Neutrophils Absolute: 1.9 10*3/uL (ref 1.4–7.0)
Neutrophils: 44 %
Platelets: 271 10*3/uL (ref 150–450)
RBC: 4.18 x10E6/uL (ref 3.77–5.28)
RDW: 13.4 % (ref 11.7–15.4)
WBC: 4.2 10*3/uL (ref 3.4–10.8)

## 2022-06-24 LAB — VITAMIN D 25 HYDROXY (VIT D DEFICIENCY, FRACTURES): Vit D, 25-Hydroxy: 60.4 ng/mL (ref 30.0–100.0)

## 2022-06-24 LAB — VITAMIN B12: Vitamin B-12: >2000 pg/mL — ABNORMAL HIGH (ref 232–1245)

## 2022-06-25 ENCOUNTER — Other Ambulatory Visit: Payer: Self-pay | Admitting: Nurse Practitioner

## 2022-06-25 DIAGNOSIS — J4520 Mild intermittent asthma, uncomplicated: Secondary | ICD-10-CM

## 2022-06-25 DIAGNOSIS — K2100 Gastro-esophageal reflux disease with esophagitis, without bleeding: Secondary | ICD-10-CM

## 2022-06-26 DIAGNOSIS — M546 Pain in thoracic spine: Secondary | ICD-10-CM | POA: Diagnosis not present

## 2022-06-26 DIAGNOSIS — M9902 Segmental and somatic dysfunction of thoracic region: Secondary | ICD-10-CM | POA: Diagnosis not present

## 2022-06-26 DIAGNOSIS — M9901 Segmental and somatic dysfunction of cervical region: Secondary | ICD-10-CM | POA: Diagnosis not present

## 2022-06-26 DIAGNOSIS — M542 Cervicalgia: Secondary | ICD-10-CM | POA: Diagnosis not present

## 2022-06-26 DIAGNOSIS — M9903 Segmental and somatic dysfunction of lumbar region: Secondary | ICD-10-CM | POA: Diagnosis not present

## 2022-07-03 DIAGNOSIS — M546 Pain in thoracic spine: Secondary | ICD-10-CM | POA: Diagnosis not present

## 2022-07-03 DIAGNOSIS — M9903 Segmental and somatic dysfunction of lumbar region: Secondary | ICD-10-CM | POA: Diagnosis not present

## 2022-07-03 DIAGNOSIS — M9901 Segmental and somatic dysfunction of cervical region: Secondary | ICD-10-CM | POA: Diagnosis not present

## 2022-07-03 DIAGNOSIS — M542 Cervicalgia: Secondary | ICD-10-CM | POA: Diagnosis not present

## 2022-07-03 DIAGNOSIS — M9902 Segmental and somatic dysfunction of thoracic region: Secondary | ICD-10-CM | POA: Diagnosis not present

## 2022-07-07 ENCOUNTER — Ambulatory Visit (HOSPITAL_COMMUNITY): Payer: Medicare Other | Admitting: Anesthesiology

## 2022-07-07 ENCOUNTER — Ambulatory Visit (HOSPITAL_COMMUNITY)
Admission: RE | Admit: 2022-07-07 | Discharge: 2022-07-07 | Disposition: A | Discharge status: Home / Self Care | Payer: Medicare Other | Attending: Internal Medicine | Admitting: Internal Medicine

## 2022-07-07 ENCOUNTER — Encounter (HOSPITAL_COMMUNITY): Payer: Self-pay

## 2022-07-07 ENCOUNTER — Ambulatory Visit (HOSPITAL_BASED_OUTPATIENT_CLINIC_OR_DEPARTMENT_OTHER): Payer: Medicare Other | Admitting: Anesthesiology

## 2022-07-07 ENCOUNTER — Other Ambulatory Visit: Payer: Self-pay

## 2022-07-07 ENCOUNTER — Encounter (HOSPITAL_COMMUNITY)
Admission: RE | Disposition: A | Discharge status: Home / Self Care | Payer: Self-pay | Source: Home / Self Care | Attending: Internal Medicine

## 2022-07-07 DIAGNOSIS — K219 Gastro-esophageal reflux disease without esophagitis: Secondary | ICD-10-CM | POA: Insufficient documentation

## 2022-07-07 DIAGNOSIS — K6389 Other specified diseases of intestine: Secondary | ICD-10-CM | POA: Diagnosis not present

## 2022-07-07 DIAGNOSIS — Z5941 Food insecurity: Secondary | ICD-10-CM | POA: Insufficient documentation

## 2022-07-07 DIAGNOSIS — K5730 Diverticulosis of large intestine without perforation or abscess without bleeding: Secondary | ICD-10-CM

## 2022-07-07 DIAGNOSIS — I4891 Unspecified atrial fibrillation: Secondary | ICD-10-CM | POA: Diagnosis not present

## 2022-07-07 DIAGNOSIS — K648 Other hemorrhoids: Secondary | ICD-10-CM | POA: Diagnosis not present

## 2022-07-07 DIAGNOSIS — K635 Polyp of colon: Secondary | ICD-10-CM | POA: Diagnosis not present

## 2022-07-07 DIAGNOSIS — J45909 Unspecified asthma, uncomplicated: Secondary | ICD-10-CM | POA: Insufficient documentation

## 2022-07-07 DIAGNOSIS — D122 Benign neoplasm of ascending colon: Secondary | ICD-10-CM

## 2022-07-07 DIAGNOSIS — Z79899 Other long term (current) drug therapy: Secondary | ICD-10-CM | POA: Diagnosis not present

## 2022-07-07 DIAGNOSIS — G629 Polyneuropathy, unspecified: Secondary | ICD-10-CM | POA: Diagnosis not present

## 2022-07-07 DIAGNOSIS — R933 Abnormal findings on diagnostic imaging of other parts of digestive tract: Secondary | ICD-10-CM | POA: Diagnosis not present

## 2022-07-07 DIAGNOSIS — Z5986 Financial insecurity: Secondary | ICD-10-CM | POA: Insufficient documentation

## 2022-07-07 DIAGNOSIS — R935 Abnormal findings on diagnostic imaging of other abdominal regions, including retroperitoneum: Secondary | ICD-10-CM

## 2022-07-07 HISTORY — PX: POLYPECTOMY: SHX5525

## 2022-07-07 HISTORY — PX: COLONOSCOPY WITH PROPOFOL: SHX5780

## 2022-07-07 SURGERY — COLONOSCOPY WITH PROPOFOL
Anesthesia: General

## 2022-07-07 MED ORDER — LACTATED RINGERS IV SOLN: INTRAVENOUS | Status: DC

## 2022-07-07 MED ORDER — PROPOFOL 10 MG/ML IV BOLUS
INTRAVENOUS | Status: DC | PRN
  Administered 2022-07-07 (×2): 30 mg via INTRAVENOUS
  Administered 2022-07-07: 100 mg via INTRAVENOUS
  Administered 2022-07-07 (×3): 30 mg via INTRAVENOUS
  Administered 2022-07-07: 20 mg via INTRAVENOUS

## 2022-07-07 NOTE — Op Note (Signed)
[facility] Patient Name: Kristin Wallace Procedure Date: 07/07/2022 11:37 AM MRN: [id number] Date of Birth: 1956/07/17 Attending MD: Elon Alas. Abbey Chatters DO CSN: [id number] Age: 66 Admit Type: Outpatient Procedure:                Colonoscopy Indications:              Abnormal CT of the GI tract Providers:                Elon Alas. Abbey Chatters, DO, Lambert Mody, Everardo Pacific Referring MD:              Medicines:                See the Anesthesia note for documentation of the                            administered medications Complications:            No immediate complications. Estimated Blood Loss:     Estimated blood loss was minimal. Procedure:                Pre-Anesthesia Assessment:                           - The anesthesia plan was to use monitored                            anesthesia care (MAC).                           After obtaining informed consent, the colonoscope                            was passed under direct vision. Throughout the                            procedure, the patient's blood pressure, pulse, and                            oxygen saturations were monitored continuously. The                            PCF-HQ190L ([id number]) scope was introduced through                            the anus and advanced to the the terminal ileum,                            with identification of the appendiceal orifice and                            IC valve. The colonoscopy was performed without                            difficulty. The patient tolerated the procedure  well. The quality of the bowel preparation was                            evaluated using the BBPS [facility] Bowel Preparation                            Scale) with scores of: Right Colon = 2 (minor                            amount of residual staining, small fragments of                            stool and/or opaque liquid, but mucosa seen well),                             Transverse Colon = 2 (minor amount of residual                            staining, small fragments of stool and/or opaque                            liquid, but mucosa seen well) and Left Colon = 2                            (minor amount of residual staining, small fragments                            of stool and/or opaque liquid, but mucosa seen                            well). The total BBPS score equals 6. The quality                            of the bowel preparation was fair. Scope In: 11:47:03 AM Scope Out: 12:07:37 PM Scope Withdrawal Time: 0 hours 15 minutes 32 seconds  Total Procedure Duration: 0 hours 20 minutes 34 seconds  Findings:      The perianal and digital rectal examinations were normal.      Non-bleeding internal hemorrhoids were found during endoscopy.      Multiple small and large-mouthed diverticula were found in the sigmoid       colon and descending colon.      A 2 mm polyp was found in the ascending colon. The polyp was sessile.       The polyp was removed with a cold biopsy forceps. Resection and       retrieval were complete.      The terminal ileum appeared normal. Impression:               - Preparation of the colon was fair.                           - Non-bleeding internal hemorrhoids.                           -  Diverticulosis in the sigmoid colon and in the                            descending colon.                           - One 2 mm polyp in the ascending colon, removed                            with a cold biopsy forceps. Resected and retrieved.                           - The examination was otherwise normal. Moderate Sedation:      Per Anesthesia Care Recommendation:           - Patient has a contact number available for                            emergencies. The signs and symptoms of potential                            delayed complications were discussed with the                            patient. Return  to normal activities tomorrow.                            Written discharge instructions were provided to the                            patient.                           - Resume previous diet.                           - Continue present medications.                           - Await pathology results.                           - Repeat colonoscopy in 5 years for surveillance.                           - Return to GI clinic in 4 months. Procedure Code(s):        --- Professional ---                           [phone], Colonoscopy, flexible; with biopsy, single                            or multiple Diagnosis Code(s):        --- Professional ---  K64.8, Other hemorrhoids                           K63.5, Polyp of colon                           K57.30, Diverticulosis of large intestine without                            perforation or abscess without bleeding                           R93.3, Abnormal findings on diagnostic imaging of                            other parts of digestive tract CPT copyright 2019 American Medical Association. All rights reserved. The codes documented in this report are preliminary and upon coder review may  be revised to meet current compliance requirements. Elon Alas. Abbey Chatters, DO [place] Abbey Chatters, DO 07/07/2022 12:13:40 PM This report has been signed electronically. Number of Addenda: 0

## 2022-07-07 NOTE — Anesthesia Preprocedure Evaluation (Addendum)
Anesthesia Evaluation  Patient identified by MRN, date of birth, ID band Patient awake    Reviewed: Allergy & Precautions, NPO status , Patient's Chart, lab work & pertinent test results  Airway Mallampati: II  TM Distance: >3 FB Neck ROM: Full    Dental  (+) Dental Advisory Given, Missing   Pulmonary asthma , neg COPD,  COPD inhaler,    Pulmonary exam normal breath sounds clear to auscultation       Cardiovascular Exercise Tolerance: Good Normal cardiovascular exam+ dysrhythmias Atrial Fibrillation  Rhythm:Regular Rate:Normal     Neuro/Psych  Neuromuscular disease (neuropathy) negative psych ROS   GI/Hepatic Neg liver ROS, GERD  Medicated and Controlled,  Endo/Other  negative endocrine ROS  Renal/GU negative Renal ROS  negative genitourinary   Musculoskeletal  (+) Arthritis , Osteoarthritis,    Abdominal   Peds negative pediatric ROS (+)  Hematology negative hematology ROS (+)   Anesthesia Other Findings   Reproductive/Obstetrics negative OB ROS                            Anesthesia Physical Anesthesia Plan  ASA: 2  Anesthesia Plan: General   Post-op Pain Management: Minimal or no pain anticipated   Induction: Intravenous  PONV Risk Score and Plan: Propofol infusion  Airway Management Planned: Nasal Cannula and Natural Airway  Additional Equipment:   Intra-op Plan:   Post-operative Plan:   Informed Consent: I have reviewed the patients History and Physical, chart, labs and discussed the procedure including the risks, benefits and alternatives for the proposed anesthesia with the patient or authorized representative who has indicated his/her understanding and acceptance.     Dental advisory given  Plan Discussed with: CRNA and Surgeon  Anesthesia Plan Comments:        Anesthesia Quick Evaluation

## 2022-07-07 NOTE — Discharge Instructions (Addendum)
  Colonoscopy Discharge Instructions  Read the instructions outlined below and refer to this sheet in the next few weeks. These discharge instructions provide you with general information on caring for yourself after you leave the hospital. Your doctor may also give you specific instructions. While your treatment has been planned according to the most current medical practices available, unavoidable complications occasionally occur.   ACTIVITY You may resume your regular activity, but move at a slower pace for the next 24 hours.  Take frequent rest periods for the next 24 hours.  Walking will help get rid of the air and reduce the bloated feeling in your belly (abdomen).  No driving for 24 hours (because of the medicine (anesthesia) used during the test).   Do not sign any important legal documents or operate any machinery for 24 hours (because of the anesthesia used during the test).  NUTRITION Drink plenty of fluids.  You may resume your normal diet as instructed by your doctor.  Begin with a light meal and progress to your normal diet. Heavy or fried foods are harder to digest and may make you feel sick to your stomach (nauseated).  Avoid alcoholic beverages for 24 hours or as instructed.  MEDICATIONS You may resume your normal medications unless your doctor tells you otherwise.  WHAT YOU CAN EXPECT TODAY Some feelings of bloating in the abdomen.  Passage of more gas than usual.  Spotting of blood in your stool or on the toilet paper.  IF YOU HAD POLYPS REMOVED DURING THE COLONOSCOPY: No aspirin products for 7 days or as instructed.  No alcohol for 7 days or as instructed.  Eat a soft diet for the next 24 hours.  FINDING OUT THE RESULTS OF YOUR TEST Not all test results are available during your visit. If your test results are not back during the visit, make an appointment with your caregiver to find out the results. Do not assume everything is normal if you have not heard from your  caregiver or the medical facility. It is important for you to follow up on all of your test results.  SEEK IMMEDIATE MEDICAL ATTENTION IF: You have more than a spotting of blood in your stool.  Your belly is swollen (abdominal distention).  You are nauseated or vomiting.  You have a temperature over 101.  You have abdominal pain or discomfort that is severe or gets worse throughout the day.   Your colonoscopy revealed 1 polyp(s) which I removed successfully. Await pathology results, my office will contact you. I recommend repeating colonoscopy in 5 years for surveillance purposes.   You also have diverticulosis and internal hemorrhoids. Continue on psyllium. Be sure to drink at least 4 to 6 glasses of water daily.   The area of concern of your colon on CT scan looked very healthy today.  Likely you had an infection in the area which is since resolved.  Follow-up with GI in 4 months.     MESSAGE LEFT AT OFFICE TO CONTACT PATIENT FOR FOLLOW UP APPOINTMENT    I hope you have a great rest of your week!  Elon Alas. Abbey Chatters, D.O. Gastroenterology and Hepatology [facility]

## 2022-07-07 NOTE — Anesthesia Postprocedure Evaluation (Signed)
Anesthesia Post Note  Patient: Kristin Wallace  Procedure(s) Performed: COLONOSCOPY WITH PROPOFOL POLYPECTOMY  Patient location during evaluation: Phase II Anesthesia Type: General Level of consciousness: awake and alert and oriented Pain management: pain level controlled Vital Signs Assessment: post-procedure vital signs reviewed and stable Respiratory status: spontaneous breathing, nonlabored ventilation and respiratory function stable Cardiovascular status: blood pressure returned to baseline and stable Postop Assessment: no apparent nausea or vomiting Anesthetic complications: no   No notable events documented.   Last Vitals:  Vitals:   07/07/22 1209 07/07/22 1212  BP: (!) 81/29 (!) 94/48  Pulse: 73 68  Resp: 15 18  Temp: 36.7 C   SpO2: 97% 98%    Last Pain:  Vitals:   07/07/22 1212  TempSrc:   PainSc: 0-No pain                 Leshon Armistead [name]

## 2022-07-07 NOTE — Interval H&P Note (Signed)
History and Physical Interval Note:  07/07/2022 11:37 AM  Kristin Wallace  has presented today for surgery, with the diagnosis of abnormal CT.  The various methods of treatment have been discussed with the patient and family. After consideration of risks, benefits and other options for treatment, the patient has consented to  Procedure(s) with comments: COLONOSCOPY WITH PROPOFOL (N/A) - 1:15pm, ASA 2, per office pt can't move up as a surgical intervention.  The patient's history has been reviewed, patient examined, no change in status, stable for surgery.  I have reviewed the patient's chart and labs.  Questions were answered to the patient's satisfaction.     Eloise Harman

## 2022-07-07 NOTE — Transfer of Care (Signed)
Immediate Anesthesia Transfer of Care Note  Patient: Kristin Wallace  Procedure(s) Performed: COLONOSCOPY WITH PROPOFOL POLYPECTOMY  Patient Location: Endoscopy Unit  Anesthesia Type:General  Level of Consciousness: awake  Airway & Oxygen Therapy: Patient Spontanous Breathing  Post-op Assessment: Report given to RN and Post -op Vital signs reviewed and stable  Post vital signs: Reviewed and stable  Last Vitals:  Vitals Value Taken Time  BP 94/48 07/07/22 1209  Temp 98   Pulse 73 07/07/22 1209  Resp 15 07/07/22 1209  SpO2 97 % 07/07/22 1209    Last Pain:  Vitals:   07/07/22 1209  TempSrc:   PainSc: 0-No pain      Patients Stated Pain Goal: 9 (47/09/62 [id number])  Complications: No notable events documented.

## 2022-07-09 LAB — SURGICAL PATHOLOGY

## 2022-07-10 DIAGNOSIS — M542 Cervicalgia: Secondary | ICD-10-CM | POA: Diagnosis not present

## 2022-07-10 DIAGNOSIS — M9903 Segmental and somatic dysfunction of lumbar region: Secondary | ICD-10-CM | POA: Diagnosis not present

## 2022-07-10 DIAGNOSIS — M546 Pain in thoracic spine: Secondary | ICD-10-CM | POA: Diagnosis not present

## 2022-07-10 DIAGNOSIS — M9902 Segmental and somatic dysfunction of thoracic region: Secondary | ICD-10-CM | POA: Diagnosis not present

## 2022-07-10 DIAGNOSIS — M9901 Segmental and somatic dysfunction of cervical region: Secondary | ICD-10-CM | POA: Diagnosis not present

## 2022-07-14 ENCOUNTER — Encounter (HOSPITAL_COMMUNITY): Payer: Self-pay | Admitting: Internal Medicine

## 2022-07-17 DIAGNOSIS — M542 Cervicalgia: Secondary | ICD-10-CM | POA: Diagnosis not present

## 2022-07-17 DIAGNOSIS — M9903 Segmental and somatic dysfunction of lumbar region: Secondary | ICD-10-CM | POA: Diagnosis not present

## 2022-07-17 DIAGNOSIS — M9902 Segmental and somatic dysfunction of thoracic region: Secondary | ICD-10-CM | POA: Diagnosis not present

## 2022-07-17 DIAGNOSIS — M9901 Segmental and somatic dysfunction of cervical region: Secondary | ICD-10-CM | POA: Diagnosis not present

## 2022-07-17 DIAGNOSIS — M546 Pain in thoracic spine: Secondary | ICD-10-CM | POA: Diagnosis not present

## 2022-07-20 ENCOUNTER — Telehealth: Payer: Self-pay | Admitting: Nurse Practitioner

## 2022-07-20 DIAGNOSIS — K2100 Gastro-esophageal reflux disease with esophagitis, without bleeding: Secondary | ICD-10-CM

## 2022-07-20 DIAGNOSIS — J4520 Mild intermittent asthma, uncomplicated: Secondary | ICD-10-CM

## 2022-07-20 MED ORDER — PANTOPRAZOLE SODIUM 40 MG PO TBEC: 40.0000 mg | DELAYED_RELEASE_TABLET | Freq: Every day | ORAL | 1 refills | Status: DC

## 2022-07-20 NOTE — Telephone Encounter (Signed)
NA/NVM refill sent to pharmacy 

## 2022-07-20 NOTE — Telephone Encounter (Signed)
  Prescription Request  07/20/2022  Is this a "Controlled Substance" medicine? no  Have you seen your PCP in the last 2 weeks? No   If YES, route message to pool  -  If NO, patient needs to be scheduled for appointment.  What is the name of the medication or equipment? pantoprazole (PROTONIX) 40 MG tablet  Have you contacted your pharmacy to request a refill? Yes    Which pharmacy would you like this sent to? [place], [place]   Patient notified that their request is being sent to the clinical staff for review and that they should receive a response within 2 business days.

## 2022-07-22 ENCOUNTER — Other Ambulatory Visit: Payer: Self-pay

## 2022-07-22 MED ORDER — BUDESONIDE-FORMOTEROL FUMARATE 160-4.5 MCG/ACT IN AERO: 1.0000 | INHALATION_SPRAY | Freq: Two times a day (BID) | RESPIRATORY_TRACT | 3 refills | Status: DC

## 2022-07-22 MED ORDER — ALBUTEROL SULFATE HFA 108 (90 BASE) MCG/ACT IN AERS: 2.0000 | INHALATION_SPRAY | Freq: Four times a day (QID) | RESPIRATORY_TRACT | 5 refills | Status: DC | PRN

## 2022-07-23 ENCOUNTER — Other Ambulatory Visit: Payer: Self-pay | Admitting: Nurse Practitioner

## 2022-07-23 ENCOUNTER — Telehealth: Payer: Self-pay | Admitting: *Deleted

## 2022-07-23 DIAGNOSIS — J4520 Mild intermittent asthma, uncomplicated: Secondary | ICD-10-CM

## 2022-07-23 DIAGNOSIS — K2100 Gastro-esophageal reflux disease with esophagitis, without bleeding: Secondary | ICD-10-CM

## 2022-07-23 NOTE — Telephone Encounter (Signed)
LMTCB Received fax from [facility] RF request for Montelukast, Raloxifene, Pantoprazole & Pregabalin Called pt to verify that she wanted these RFs to go to mail order pharmacy, since she called in on 07/20/22 and requested RF on her Pantoprazole to go to [facility].

## 2022-07-27 MED ORDER — RALOXIFENE HCL 60 MG PO TABS: 60.0000 mg | ORAL_TABLET | Freq: Every day | ORAL | 1 refills | Status: DC

## 2022-07-27 MED ORDER — PANTOPRAZOLE SODIUM 40 MG PO TBEC: 40.0000 mg | DELAYED_RELEASE_TABLET | Freq: Every day | ORAL | 1 refills | Status: DC

## 2022-07-27 MED ORDER — MONTELUKAST SODIUM 10 MG PO TABS: 10.0000 mg | ORAL_TABLET | Freq: Every day | ORAL | 1 refills | Status: DC

## 2022-07-27 NOTE — Addendum Note (Signed)
Addended by: Antonietta Barcelona D on: 07/27/2022 11:45 AM   Modules accepted: Orders

## 2022-07-27 NOTE — Telephone Encounter (Signed)
TC to pt, she has enrolled in mail order w/ [facility]. Refills sent.

## 2022-07-30 ENCOUNTER — Telehealth: Payer: Self-pay

## 2022-07-30 DIAGNOSIS — M9903 Segmental and somatic dysfunction of lumbar region: Secondary | ICD-10-CM | POA: Diagnosis not present

## 2022-07-30 DIAGNOSIS — M546 Pain in thoracic spine: Secondary | ICD-10-CM | POA: Diagnosis not present

## 2022-07-30 DIAGNOSIS — M9901 Segmental and somatic dysfunction of cervical region: Secondary | ICD-10-CM | POA: Diagnosis not present

## 2022-07-30 DIAGNOSIS — M542 Cervicalgia: Secondary | ICD-10-CM | POA: Diagnosis not present

## 2022-07-30 DIAGNOSIS — M9902 Segmental and somatic dysfunction of thoracic region: Secondary | ICD-10-CM | POA: Diagnosis not present

## 2022-07-30 MED ORDER — ALBUTEROL SULFATE HFA 108 (90 BASE) MCG/ACT IN AERS: 2.0000 | INHALATION_SPRAY | Freq: Four times a day (QID) | RESPIRATORY_TRACT | 3 refills | Status: DC | PRN

## 2022-07-30 NOTE — Telephone Encounter (Signed)
Proair inhaler ordered

## 2022-07-30 NOTE — Telephone Encounter (Signed)
Proair is best choice, same rx

## 2022-07-30 NOTE — Telephone Encounter (Signed)
Received notification form [facility] albuterol inhaler (ventolin HFA) is not covered by pts insurance.   Covered alternatives are:  Albuterol Proventil HFA  Albuterol Proair HFA  Proair Respiclick Levalbuterol HFA   Please advise.

## 2022-08-05 DIAGNOSIS — N6320 Unspecified lump in the left breast, unspecified quadrant: Secondary | ICD-10-CM | POA: Diagnosis not present

## 2022-08-05 DIAGNOSIS — R9230 Dense breasts, unspecified: Secondary | ICD-10-CM | POA: Diagnosis not present

## 2022-08-05 DIAGNOSIS — N6322 Unspecified lump in the left breast, upper inner quadrant: Secondary | ICD-10-CM | POA: Diagnosis not present

## 2022-08-05 DIAGNOSIS — R928 Other abnormal and inconclusive findings on diagnostic imaging of breast: Secondary | ICD-10-CM | POA: Diagnosis not present

## 2022-08-07 ENCOUNTER — Encounter: Payer: Self-pay | Admitting: Pulmonary Disease

## 2022-08-07 ENCOUNTER — Ambulatory Visit (INDEPENDENT_AMBULATORY_CARE_PROVIDER_SITE_OTHER): Payer: Medicare Other | Admitting: Pulmonary Disease

## 2022-08-07 VITALS — BP 118/62 | HR 76 | Temp 97.7°F | Ht 65.0 in | Wt 124.6 lb

## 2022-08-07 DIAGNOSIS — J4540 Moderate persistent asthma, uncomplicated: Secondary | ICD-10-CM | POA: Diagnosis not present

## 2022-08-07 NOTE — Patient Instructions (Addendum)
Will arrange for spacer device to use with symbicort  Will get medical records from: Dr. Rance Muir [address] [place], [place] [address] [phone]  Follow up in 6 months

## 2022-08-07 NOTE — Progress Notes (Signed)
[facility]  Chief Complaint  Patient presents with   New Patient (Initial Visit)    Switch from Dr. Melvyn Novas.      Past Surgical History:  She  has a past surgical history that includes Tubal ligation; Abdominal hysterectomy; RIGHT AND LEFT HEART CATH; Breast surgery (Left); Colonoscopy; Esophagogastroduodenoscopy; Colonoscopy with propofol (N/A, 07/07/2022); and polypectomy (07/07/2022).  Past Medical History:  OA, A fib, GERD, Lyme disease, Neuropathy, Osteoporosis, Vit D deficiency  Constitutional:  BP 118/62 (BP Location: Left Arm, Patient Position: Sitting)   Pulse 76   Temp 97.7 F (36.5 C) (Temporal)   Ht '5\' 5"'$  (1.651 m)   Wt 124 lb 9.6 oz (56.5 kg)   SpO2 97% Comment: room air  BMI 20.73 kg/m   Brief Summary:  Kristin Wallace is a 66 y.o. female with asthma and hoarseness.      Subjective:   She saw pulmonary in [place].  Was having more trouble with her asthma until she stopped most dairy products.  More recently saw Dr. Melvyn Novas.  Chest xray from 01/06/22 showed hyperinflation.   She gets intermittent cough.  Usually doesn't have phlegm.  Has reflux.  No having sinus congestion.  Using symbicort 1 puff bid and singulair at night.  Hasn't needed albuterol.  Sleeping okay.  Has trouble around cats and strong smells.  No problem with aspirin.  Denies skin rashes.  Physical Exam:   Appearance - well kempt   ENMT - no sinus tenderness, no oral exudate, no LAN, Mallampati 2 airway, no stridor  Respiratory - equal breath sounds bilaterally, no wheezing or rales  CV - s1s2 regular rate and rhythm, no murmurs  Ext - no clubbing, no edema  Skin - no rashes  Psych - normal mood and affect   Pulmonary testing:    Social History:  She  reports that she has never smoked. She has never used smokeless tobacco. She reports current alcohol use. She reports that she does not use drugs.  Family History:  Her family history includes  Cancer in her brother and brother; Depression in her daughter and mother; Heart disease in her sister; Migraines in her daughter; Other in her father and mother; Thyroid disease in her daughter.     Assessment/Plan:   Moderate, persistent asthma. - continue symbicort 1 puff bid - will arrange for spacer device - continue singulair 10 mg qhs - hold off on refills of albuterol for now - will get copy of records from Dr. Rance Muir in [place], [place]  Hoarseness. - salt water gargle - sip water with urge to cough - avoid throat clearing or forcing cough - teaspoon of honey prn  Erosive osteoarthritis. - followed by Dr. Vernelle Emerald with rheumatoloyg  Time Spent Involved in Patient Care on Day of Examination:  26 minutes  Follow up:   Patient Instructions  Will arrange for spacer device to use with symbicort  Will get medical records from: Dr. Rance Muir [address] [place] [address] [phone]  Follow up in 6 months  Medication List:   Allergies as of 08/07/2022       Reactions   Shellfish Allergy Other (See Comments)   Vomiting         Medication List        Accurate as of August 07, 2022 11:31 AM. If you have any questions, ask your nurse or doctor.  STOP taking these medications    albuterol 108 (90 Base) MCG/ACT inhaler Commonly known as: ProAir HFA Stopped by: Chesley Mires, MD       TAKE these medications    B-12 2500 MCG Tabs Take 2,500 mcg by mouth in the morning and at bedtime.   budesonide-formoterol 160-4.5 MCG/ACT inhaler Commonly known as: SYMBICORT Inhale 1 puff into the lungs 2 (two) times daily.   CALCIUM 600+D3 PO Take 1 tablet by mouth every morning.   CINNAMON PO Take 1 Dose by mouth See admin instructions. 1 teaspoonful twice daily with 2 teaspoonful of honey   cycloSPORINE 0.05 % ophthalmic emulsion Commonly known as: RESTASIS Place 2 drops into both eyes 2 (two) times daily.    hydroxychloroquine 200 MG tablet Commonly known as: PLAQUENIL Take 1 tablet by mouth twice daily   montelukast 10 MG tablet Commonly known as: SINGULAIR Take 1 tablet (10 mg total) by mouth at bedtime.   Multivitamin Women 50+ Tabs Take 1 tablet by mouth in the morning.   Dry Eye Formula Caps Take 3 capsules by mouth in the morning.   OPTIVE 0.5-0.9 % ophthalmic solution Generic drug: carboxymethylcellul-glycerin Place 1 drop into both eyes 2 (two) times daily as needed for dry eyes.   pantoprazole 40 MG tablet Commonly known as: PROTONIX Take 1 tablet (40 mg total) by mouth daily.   pregabalin 75 MG capsule Commonly known as: LYRICA Take 75 mg by mouth 2 (two) times daily.   PSYLLIUM HUSK PO Take 1 Dose by mouth in the morning.   raloxifene 60 MG tablet Commonly known as: EVISTA Take 1 tablet (60 mg total) by mouth daily.   TURMERIC CURCUMIN PO Take 1 capsule by mouth in the morning and at bedtime. W/Ginger Powder   vitamin C 1000 MG tablet Take 1,000 mg by mouth in the morning.   Vitamin D3 125 MCG (5000 UT) Caps Take 5,000 Units by mouth in the morning.        Signature:  Chesley Mires, MD [place] Pager - [phone] 08/07/2022, 11:31 AM

## 2022-08-20 DIAGNOSIS — M9902 Segmental and somatic dysfunction of thoracic region: Secondary | ICD-10-CM | POA: Diagnosis not present

## 2022-08-20 DIAGNOSIS — M542 Cervicalgia: Secondary | ICD-10-CM | POA: Diagnosis not present

## 2022-08-20 DIAGNOSIS — M9901 Segmental and somatic dysfunction of cervical region: Secondary | ICD-10-CM | POA: Diagnosis not present

## 2022-08-20 DIAGNOSIS — M9903 Segmental and somatic dysfunction of lumbar region: Secondary | ICD-10-CM | POA: Diagnosis not present

## 2022-08-20 DIAGNOSIS — M546 Pain in thoracic spine: Secondary | ICD-10-CM | POA: Diagnosis not present

## 2022-09-04 DIAGNOSIS — M546 Pain in thoracic spine: Secondary | ICD-10-CM | POA: Diagnosis not present

## 2022-09-04 DIAGNOSIS — M542 Cervicalgia: Secondary | ICD-10-CM | POA: Diagnosis not present

## 2022-09-04 DIAGNOSIS — M9901 Segmental and somatic dysfunction of cervical region: Secondary | ICD-10-CM | POA: Diagnosis not present

## 2022-09-04 DIAGNOSIS — M9902 Segmental and somatic dysfunction of thoracic region: Secondary | ICD-10-CM | POA: Diagnosis not present

## 2022-09-04 DIAGNOSIS — M9903 Segmental and somatic dysfunction of lumbar region: Secondary | ICD-10-CM | POA: Diagnosis not present

## 2022-09-13 DIAGNOSIS — S30861A Insect bite (nonvenomous) of abdominal wall, initial encounter: Secondary | ICD-10-CM | POA: Diagnosis not present

## 2022-09-13 DIAGNOSIS — L089 Local infection of the skin and subcutaneous tissue, unspecified: Secondary | ICD-10-CM | POA: Diagnosis not present

## 2022-09-28 NOTE — Progress Notes (Signed)
Office Visit Note  Patient: Kristin Wallace             Date of Birth: [birth year]           MRN: [id number]             PCP: Ivy Lynn, NP Referring: Ivy Lynn, NP Visit Date: 09/29/2022   Subjective:  Follow-up (Doing good)   History of Present Illness: Kristin Wallace is a 66 y.o. female here for follow up for erosive osteoarthritis and osteoporosis on HCQ 400 mg daily and raloxifene. Overall symptoms have been doing pretty well. New swelling at her right wrist started about 2 or 3 weeks ago that is mildly painful. Dropping items unintentionally from both hands some and has been modifying her grip for this. She has scheduled for multiple teeth fillings coming up in the next month with possible extractions if unsuccessful. Plans for f/u with additional teeth sometime after that.   Previous HPI 03/25/22 [name] is a 66 y.o. female here for follow up for erosive OA and osteoporosis.  She is overall doing pretty well.  She still has impaired mobility and dexterity with the finger deformities but pain is somewhat decreased.  No major flareup with increase in localized joint swelling.   Previous HPI 12/23/2021 [name] is a 66 y.o. female here for erosive OA and osteoporosis. 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 She was prescribed hydroxychloroquine and treated with PIP joint steroid injection. Imaging of this previously looked consistent with erosive OA of the hands. She continues to have pain and stiffness worst in the right hand lasting much of the day. She takes lyrica more for leg neuropathy than joint pains which is somewhat helpful. For osteoporosis she started raloxifene due to need for ongoing dental procedures so avoided bisphosphonate treatment. She has some multiple toe fractures over the years and has mild neuropathy but no falls or fragility  fractures.   Review of Systems  Constitutional:  Positive for fatigue.  HENT:  Positive for mouth dryness. Negative for mouth sores.   Eyes:  Positive for dryness.  Respiratory:  Positive for shortness of breath.   Cardiovascular:  Negative for chest pain and palpitations.  Gastrointestinal:  Negative for blood in stool, constipation and diarrhea.  Endocrine: Positive for increased urination.  Genitourinary:  Positive for involuntary urination.  Musculoskeletal:  Positive for joint pain, gait problem, joint pain, joint swelling, muscle weakness and morning stiffness. Negative for myalgias, muscle tenderness and myalgias.  Skin:  Positive for hair loss and sensitivity to sunlight. Negative for color change and rash.  Allergic/Immunologic: Negative for susceptible to infections.  Neurological:  Positive for dizziness and headaches.  Hematological:  Negative for swollen glands.  Psychiatric/Behavioral:  Negative for depressed mood and sleep disturbance. The patient is not nervous/anxious.     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 01/18/2022   Episodic weakness/lightheadedness when upright few minutes better with drinking fluid 01/18/2022   Annual physical exam 12/24/2021   Erosive osteoarthritis of both hands 12/23/2021   Hoarseness of voice 12/23/2021   Other fatigue 12/23/2021   Vitamin D deficiency 12/23/2021   Age-related osteoporosis without current pathological fracture 12/23/2021   Mild intermittent asthma without complication 51/88/4166   Gastroesophageal reflux  disease with esophagitis without hemorrhage 10/13/2021   Establishing care with new doctor, encounter for 10/13/2021    Past Medical History:  Diagnosis Date   Arthritis    erosive osteoarthritis   Asthma    Atrial fibrillation (HCC)    Chronically dry eyes    Fatigue    chronic ( and  weakness)   GERD (gastroesophageal reflux disease)    Lyme disease    Neuromuscular disorder (HCC)    neuropathy   Neuropathy    legs and feet   Osteoporosis     Family History  Problem Relation Age of Onset   Depression Mother    Other Mother        suicide   Other Father        plane crash   Heart disease Sister    Cancer Brother    Cancer Brother        skin cancer   Depression Daughter    Thyroid disease Daughter    Migraines Daughter    Colon cancer Neg Hx    Past Surgical History:  Procedure Laterality Date   ABDOMINAL HYSTERECTOMY     BREAST SURGERY Left    neg tumor   COLONOSCOPY     COLONOSCOPY WITH PROPOFOL N/A 07/07/2022   Procedure: COLONOSCOPY WITH PROPOFOL;  Surgeon: Eloise Harman, DO;  [facility];  Service: Endoscopy;  Laterality: N/A;  1:15pm, ASA 2, per office pt can't move up   ESOPHAGOGASTRODUODENOSCOPY     POLYPECTOMY  07/07/2022   Procedure: POLYPECTOMY;  Surgeon: Eloise Harman, DO;  [facility]: [facility];  Service: Endoscopy;;   RIGHT AND LEFT HEART CATH     TUBAL LIGATION     Social History   Social History Narrative   Moved here from [place] this year 07/27/2021.   Daughter lives with her   Right handed   Immunization History  Administered Date(s) Administered   Moderna SARS-COV2 Booster Vaccination 09/06/2020, 05/17/2021   Moderna Sars-Covid-2 Vaccination 11/13/2019, 12/11/2019     Objective: Vital Signs: BP 124/67 (BP Location: Left Arm, Patient Position: Sitting, Cuff Size: Normal)   Pulse 77   Resp 15   Ht 5' 6.75" (1.695 m)   Wt 126 lb (57.2 kg)   BMI 19.88 kg/m    Physical Exam Cardiovascular:     Rate and Rhythm: Normal rate and regular rhythm.  Pulmonary:     Effort: Pulmonary effort is normal.     Breath sounds: Normal breath sounds.  Musculoskeletal:     Right lower leg: No edema.     Left lower leg: No edema.  Skin:    General: Skin is warm and dry.     Findings: No rash.     Comments:  Tortuous varicose veins in b/l legs  Neurological:     Mental Status: She is alert.  Psychiatric:        Mood and Affect: Mood normal.      Musculoskeletal Exam:  Elbows full ROM no tenderness or swelling Wrists full ROM no palpable radiocarpal joint effusion, swelling present on flexor side of arm proximal to right wrist with tenderness to palpation, no  Bony enlargement in multiple fingers worst at right hand 3rd and then 2nd PIPs with decreased ROM and lateral deviation, no palpable synovitis, 1st CMC joint enlarged and squaring worse on left hand Knees full ROM no tenderness or swelling Left lateral ankle tenderness to pressure and with inversion ROM along inferior edge of lateral  malleolus  Investigation: No additional findings.  Imaging: No results found.  Recent Labs: Lab Results  Component Value Date   WBC 4.8 09/29/2022   HGB 13.2 09/29/2022   PLT 264 09/29/2022   NA 142 09/29/2022   K 3.7 09/29/2022   CL 104 09/29/2022   CO2 31 09/29/2022   GLUCOSE 76 09/29/2022   BUN 10 09/29/2022   CREATININE 0.53 09/29/2022   BILITOT 0.3 09/29/2022   AST 18 09/29/2022   ALT 15 09/29/2022   PROT 7.0 09/29/2022   CALCIUM 9.8 09/29/2022    Speciality Comments: PLQ Eye Exam 03/04/2022 Normal [facility] f/u 12 months.  Procedures:  No procedures performed Allergies: Shellfish allergy   Assessment / Plan:     Visit Diagnoses: Erosive osteoarthritis of both hands - Plan: C-reactive protein, CBC with Differential/Platelet, COMPLETE METABOLIC PANEL WITH GFR  Symptoms currently pretty good she has not suffered any severe flare ups since our last visit. Tolerating the HCQ fine so recommend continuing treatment  200 mg daily as it appears to be helping with inflammatory episodes. Checking CRP for inflammatory disease activity monitoring. Also recheck CBC and CMP today. May ophthalmology exam for HCQ retinal toxicity screening was fine.  Age-related osteoporosis without  current pathological fracture  Discussed she would be a good candidate for osteoporosis treatment but still has additional dental procedures planned with extractions in the next few months. Recommend waiting until scheduled major procedures are completed to minimize any risk of ONJ complication on antiresorptives.  Orders: Orders Placed This Encounter  Procedures   C-reactive protein   CBC with Differential/Platelet   COMPLETE METABOLIC PANEL WITH GFR   No orders of the defined types were placed in this encounter.    Follow-Up Instructions: Return in about 6 months (around 03/30/2023) for [facility] on HCQ/OP f/u 72mo.   CCollier Salina MD  Note - This record has been created using [employer]  Chart creation errors have been sought, but may not always  have been located. Such creation errors do not reflect on  the standard of medical care.

## 2022-09-29 ENCOUNTER — Ambulatory Visit: Payer: Medicare Other | Attending: Internal Medicine | Admitting: Internal Medicine

## 2022-09-29 ENCOUNTER — Encounter: Payer: Self-pay | Admitting: Internal Medicine

## 2022-09-29 VITALS — BP 124/67 | HR 77 | Resp 15 | Ht 66.75 in | Wt 126.0 lb

## 2022-09-29 DIAGNOSIS — M810 Age-related osteoporosis without current pathological fracture: Secondary | ICD-10-CM | POA: Diagnosis not present

## 2022-09-29 DIAGNOSIS — M154 Erosive (osteo)arthritis: Secondary | ICD-10-CM

## 2022-09-30 LAB — CBC WITH DIFFERENTIAL/PLATELET
Absolute Monocytes: 437 cells/uL (ref 200–950)
Basophils Absolute: 19 cells/uL (ref 0–200)
Basophils Relative: 0.4 %
Eosinophils Absolute: 82 cells/uL (ref 15–500)
Eosinophils Relative: 1.7 %
HCT: 38.7 % (ref 35.0–45.0)
Hemoglobin: 13.2 g/dL (ref 11.7–15.5)
Lymphs Abs: 1838 cells/uL (ref 850–3900)
MCH: 30.2 pg (ref 27.0–33.0)
MCHC: 34.1 g/dL (ref 32.0–36.0)
MCV: 88.6 fL (ref 80.0–100.0)
MPV: 9.6 fL (ref 7.5–12.5)
Monocytes Relative: 9.1 %
Neutro Abs: 2424 cells/uL (ref 1500–7800)
Neutrophils Relative %: 50.5 %
Platelets: 264 10*3/uL (ref 140–400)
RBC: 4.37 10*6/uL (ref 3.80–5.10)
RDW: 12.9 % (ref 11.0–15.0)
Total Lymphocyte: 38.3 %
WBC: 4.8 10*3/uL (ref 3.8–10.8)

## 2022-09-30 LAB — COMPLETE METABOLIC PANEL WITH GFR
AG Ratio: 1.6 (calc) (ref 1.0–2.5)
ALT: 15 U/L (ref 6–29)
AST: 18 U/L (ref 10–35)
Albumin: 4.3 g/dL (ref 3.6–5.1)
Alkaline phosphatase (APISO): 45 U/L (ref 37–153)
BUN: 10 mg/dL (ref 7–25)
CO2: 31 mmol/L (ref 20–32)
Calcium: 9.8 mg/dL (ref 8.6–10.4)
Chloride: 104 mmol/L (ref 98–110)
Creat: 0.53 mg/dL (ref 0.50–1.05)
Globulin: 2.7 g/dL (calc) (ref 1.9–3.7)
Glucose, Bld: 76 mg/dL (ref 65–139)
Potassium: 3.7 mmol/L (ref 3.5–5.3)
Sodium: 142 mmol/L (ref 135–146)
Total Bilirubin: 0.3 mg/dL (ref 0.2–1.2)
Total Protein: 7 g/dL (ref 6.1–8.1)
eGFR: 101 mL/min/{1.73_m2} (ref >=60)

## 2022-09-30 LAB — C-REACTIVE PROTEIN: CRP: 0.7 mg/L (ref <8.0)

## 2022-09-30 NOTE — Progress Notes (Signed)
Lab results look fine no problems for continuing hydroxychloroquine as planned. CRP is normal no evidence of increased inflammation.

## 2022-10-04 DIAGNOSIS — U071 COVID-19: Secondary | ICD-10-CM | POA: Diagnosis not present

## 2022-10-04 DIAGNOSIS — U099 Post covid-19 condition, unspecified: Secondary | ICD-10-CM | POA: Diagnosis not present

## 2022-10-04 DIAGNOSIS — R0789 Other chest pain: Secondary | ICD-10-CM | POA: Diagnosis not present

## 2022-10-04 DIAGNOSIS — R42 Dizziness and giddiness: Secondary | ICD-10-CM | POA: Diagnosis not present

## 2022-10-12 DIAGNOSIS — M9901 Segmental and somatic dysfunction of cervical region: Secondary | ICD-10-CM | POA: Diagnosis not present

## 2022-10-12 DIAGNOSIS — M542 Cervicalgia: Secondary | ICD-10-CM | POA: Diagnosis not present

## 2022-10-12 DIAGNOSIS — M9903 Segmental and somatic dysfunction of lumbar region: Secondary | ICD-10-CM | POA: Diagnosis not present

## 2022-10-12 DIAGNOSIS — M9902 Segmental and somatic dysfunction of thoracic region: Secondary | ICD-10-CM | POA: Diagnosis not present

## 2022-10-12 DIAGNOSIS — M546 Pain in thoracic spine: Secondary | ICD-10-CM | POA: Diagnosis not present

## 2022-10-19 ENCOUNTER — Encounter: Payer: Self-pay | Admitting: Nurse Practitioner

## 2022-10-22 DIAGNOSIS — R9431 Abnormal electrocardiogram [ECG] [EKG]: Secondary | ICD-10-CM | POA: Diagnosis not present

## 2022-10-22 DIAGNOSIS — Z8679 Personal history of other diseases of the circulatory system: Secondary | ICD-10-CM | POA: Diagnosis not present

## 2022-10-22 DIAGNOSIS — R0789 Other chest pain: Secondary | ICD-10-CM | POA: Diagnosis not present

## 2022-10-22 DIAGNOSIS — R29818 Other symptoms and signs involving the nervous system: Secondary | ICD-10-CM | POA: Diagnosis not present

## 2022-10-28 DIAGNOSIS — Z8679 Personal history of other diseases of the circulatory system: Secondary | ICD-10-CM | POA: Diagnosis not present

## 2022-10-28 DIAGNOSIS — I340 Nonrheumatic mitral (valve) insufficiency: Secondary | ICD-10-CM | POA: Diagnosis not present

## 2022-10-28 DIAGNOSIS — Z87898 Personal history of other specified conditions: Secondary | ICD-10-CM | POA: Diagnosis not present

## 2022-10-28 DIAGNOSIS — R0789 Other chest pain: Secondary | ICD-10-CM | POA: Diagnosis not present

## 2022-10-28 DIAGNOSIS — R9431 Abnormal electrocardiogram [ECG] [EKG]: Secondary | ICD-10-CM | POA: Diagnosis not present

## 2022-11-03 ENCOUNTER — Ambulatory Visit (INDEPENDENT_AMBULATORY_CARE_PROVIDER_SITE_OTHER): Payer: Medicare Other | Admitting: Gastroenterology

## 2022-11-03 ENCOUNTER — Encounter: Payer: Self-pay | Admitting: Gastroenterology

## 2022-11-03 VITALS — BP 112/69 | HR 84 | Temp 97.5°F | Ht 65.75 in | Wt 127.6 lb

## 2022-11-03 DIAGNOSIS — K219 Gastro-esophageal reflux disease without esophagitis: Secondary | ICD-10-CM

## 2022-11-03 DIAGNOSIS — J4520 Mild intermittent asthma, uncomplicated: Secondary | ICD-10-CM | POA: Diagnosis not present

## 2022-11-03 DIAGNOSIS — K2100 Gastro-esophageal reflux disease with esophagitis, without bleeding: Secondary | ICD-10-CM

## 2022-11-03 NOTE — Progress Notes (Signed)
Gastroenterology Office Note     Primary Care Physician:  Ivy Lynn, NP  Primary Gastroenterologist: Dr. Abbey Chatters   Chief Complaint   Chief Complaint  Patient presents with   Follow-up    Follow up after procedures     History of Present Illness   Kristin Wallace is a 67 y.o. female presenting today in follow-up with a history of abnormal CT scan in July 2023 revealing mass-like thickening of the cecum. Colonoscopy completed in interim. Chronic GERD. Previously lived up [place] but now residing in [place], [place]. Prior EGD/dilation in the past at outside facility during the pandemic.   Colonoscopy Sept 2023: fair prep, non-bleeding internal hemorrhoids, diverticulosis, one 2 mm polyp in ascending colon s/p removal. Benign mucosa on path.   Chronic GERD on pantoprazole. No dysphagia. Filled by PCP. Notes chest pain, shortness of breath, sometimes dizziness. When bending over, will feel dizzy. Nocturnal GERD rare. Eats later at night sometimes. Has changed caffeine intake to decaf coffee.      Past Medical History:  Diagnosis Date   Arthritis    erosive osteoarthritis   Asthma    Atrial fibrillation (HCC)    Chronically dry eyes    Fatigue    chronic ( and weakness)   GERD (gastroesophageal reflux disease)    Lyme disease    Neuromuscular disorder (HCC)    neuropathy   Neuropathy    legs and feet   Osteoporosis     Past Surgical History:  Procedure Laterality Date   ABDOMINAL HYSTERECTOMY     BREAST SURGERY Left    neg tumor   COLONOSCOPY     COLONOSCOPY WITH PROPOFOL N/A 07/07/2022   Procedure: COLONOSCOPY WITH PROPOFOL;  Surgeon: Eloise Harman, DO;  [facility];  Service: Endoscopy;  Laterality: N/A;  1:15pm, ASA 2, per office pt can't move up   ESOPHAGOGASTRODUODENOSCOPY     POLYPECTOMY  07/07/2022   Procedure: POLYPECTOMY;  Surgeon: Eloise Harman, DO;  [facility];  Service: Endoscopy;;   RIGHT AND LEFT HEART CATH     TUBAL  LIGATION      Current Outpatient Medications  Medication Sig Dispense Refill   Ascorbic Acid (VITAMIN C) 1000 MG tablet Take 1,000 mg by mouth in the morning.     Calcium Carb-Cholecalciferol (CALCIUM 600+D3 PO) Take 1 tablet by mouth every morning.     carboxymethylcellul-glycerin (OPTIVE) 0.5-0.9 % ophthalmic solution Place 1 drop into both eyes 2 (two) times daily as needed for dry eyes.     Cholecalciferol (VITAMIN D3) 125 MCG (5000 UT) CAPS Take 5,000 Units by mouth in the morning.     CINNAMON PO Take 1 Dose by mouth See admin instructions. 1 teaspoonful twice daily with 2 teaspoonful of honey     Cyanocobalamin (B-12) 2500 MCG TABS Take 2,500 mcg by mouth in the morning and at bedtime.     cycloSPORINE (RESTASIS) 0.05 % ophthalmic emulsion Place 2 drops into both eyes 2 (two) times daily.     hydroxychloroquine (PLAQUENIL) 200 MG tablet Take 1 tablet by mouth twice daily 180 tablet 1   montelukast (SINGULAIR) 10 MG tablet Take 1 tablet (10 mg total) by mouth at bedtime. 90 tablet 1   Multiple Vitamins-Minerals (DRY EYE FORMULA) CAPS Take 3 capsules by mouth in the morning.     Multiple Vitamins-Minerals (MULTIVITAMIN WOMEN 50+) TABS Take 1 tablet by mouth in the morning.     pantoprazole (PROTONIX)  40 MG tablet Take 1 tablet (40 mg total) by mouth daily. 90 tablet 1   pregabalin (LYRICA) 75 MG capsule Take 75 mg by mouth 2 (two) times daily.     PSYLLIUM HUSK PO Take 1 Dose by mouth in the morning.     raloxifene (EVISTA) 60 MG tablet Take 1 tablet (60 mg total) by mouth daily. 90 tablet 1   SODIUM FLUORIDE 5000 PPM 1.1 % PSTE Take by mouth.     TURMERIC CURCUMIN PO Take 1 capsule by mouth in the morning and at bedtime. W/Ginger Powder     budesonide-formoterol (SYMBICORT) 160-4.5 MCG/ACT inhaler Inhale 1 puff into the lungs 2 (two) times daily. 18 g 3   No current facility-administered medications for this visit.    Allergies as of 11/03/2022 - Review Complete 11/03/2022  Allergen  Reaction Noted   Shellfish allergy Other (See Comments) 10/13/2021    Family History  Problem Relation Age of Onset   Depression Mother    Other Mother        suicide   Other Father        plane crash   Heart disease Sister    Cancer Brother    Cancer Brother        skin cancer   Depression Daughter    Thyroid disease Daughter    Migraines Daughter    Colon cancer Neg Hx     Social History   Socioeconomic History   Marital status: Divorced    Spouse name: Not on file   Number of children: 3   Years of education: Not on file   Highest education level: Not on file  Occupational History   Occupation: retired  Tobacco Use   Smoking status: Never    Passive exposure: Never   Smokeless tobacco: Never  Vaping Use   Vaping Use: Never used  Substance and Sexual Activity   Alcohol use: Yes    Comment: 3 yearly   Drug use: Never   Sexual activity: Not on file  Other Topics Concern   Not on file  Social History Narrative   Moved here from [place] this year 07/27/2021.   Daughter lives with her   Right handed   Social Determinants of Health   Financial Resource Strain: Medium Risk (11/25/2021)   Overall Financial Resource Strain (CARDIA)    Difficulty of Paying Living Expenses: Somewhat hard  Food Insecurity: Food Insecurity Present (12/03/2021)   Hunger Vital Sign    Worried About Running Out of Food in the Last Year: Often true    Ran Out of Food in the Last Year: Sometimes true  Transportation Needs: No Transportation Needs (11/25/2021)   PRAPARE - Hydrologist (Medical): No    Lack of Transportation (Non-Medical): No  Physical Activity: Insufficiently Active (11/25/2021)   Exercise Vital Sign    Days of Exercise per Week: 3 days    Minutes of Exercise per Session: 30 min  Stress: No Stress Concern Present (11/25/2021)   [place]    Feeling of Stress : Only a little   Social Connections: Moderately Integrated (11/25/2021)   Social Connection and Isolation Panel [NHANES]    Frequency of Communication with Friends and Family: More than three times a week    Frequency of Social Gatherings with Friends and Family: Once a week    Attends Religious Services: More than 4 times per year    Active  Member of Clubs or Organizations: Yes    Attends Music therapist: More than 4 times per year    Marital Status: Divorced  Intimate Partner Violence: Not At Risk (11/25/2021)   Humiliation, Afraid, Rape, and Kick questionnaire    Fear of Current or Ex-Partner: No    Emotionally Abused: No    Physically Abused: No    Sexually Abused: No     Review of Systems   Gen: Denies any fever, chills, fatigue, weight loss, lack of appetite.  CV: Denies chest pain, heart palpitations, peripheral edema, syncope.  Resp: Denies shortness of breath at rest or with exertion. Denies wheezing or cough.  GI: Denies dysphagia or odynophagia. Denies jaundice, hematemesis, fecal incontinence. GU : Denies urinary burning, urinary frequency, urinary hesitancy MS: Denies joint pain, muscle weakness, cramps, or limitation of movement.  Derm: Denies rash, itching, dry skin Psych: Denies depression, anxiety, memory loss, and confusion Heme: Denies bruising, bleeding, and enlarged lymph nodes.   Physical Exam   BP 112/69   Pulse 84   Temp (!) 97.5 F (36.4 C)   Ht 5' 5.75" (1.67 m)   Wt 127 lb 9.6 oz (57.9 kg)   BMI 20.75 kg/m  General:   Alert and oriented. Pleasant and cooperative. Well-nourished and well-developed.  Head:  Normocephalic and atraumatic. Eyes:  Without icterus Abdomen:  +BS, soft, non-tender and non-distended. No HSM noted. No guarding or rebound. No masses appreciated.  Rectal:  Deferred  Msk:  Symmetrical without gross deformities. Normal posture. Extremities:  Without edema. Neurologic:  Alert and  oriented x4;  grossly normal  neurologically. Skin:  Intact without significant lesions or rashes. Psych:  Alert and cooperative. Normal mood and affect.   Assessment   Kristin Wallace is a 67 y.o. female presenting today in follow-up after colonoscopy due to abnormal CT and GERD.  Abnormal CT: colonoscopy completed unrevealing. Next in 5-10 years.   GERD: doing well on pantoprazole daily.  PCP has been filling this for her.  We will see her back as needed.   PLAN    Return prn Colonoscopy in 5-10 years   Annitta Needs, PhD, [facility]

## 2022-11-03 NOTE — Patient Instructions (Signed)
Continue pantoprazole once daily.  Continue to avoid eating late at night, and try not to eat/drink within 2 hours of laying down. You can take Pepcid as needed if breakthrough indigestion at night.  We will see you back as needed!  I enjoyed seeing you again today! As you know, I value our relationship and want to provide genuine, compassionate, and quality care. I welcome your feedback. If you receive a survey regarding your visit,  I greatly appreciate you taking time to fill this out. See you next time!  Annitta Needs, PhD, ANP-BC [facility]

## 2022-11-06 ENCOUNTER — Ambulatory Visit: Payer: Medicare Other | Admitting: Nurse Practitioner

## 2022-11-09 DIAGNOSIS — M546 Pain in thoracic spine: Secondary | ICD-10-CM | POA: Diagnosis not present

## 2022-11-09 DIAGNOSIS — M9901 Segmental and somatic dysfunction of cervical region: Secondary | ICD-10-CM | POA: Diagnosis not present

## 2022-11-09 DIAGNOSIS — M9903 Segmental and somatic dysfunction of lumbar region: Secondary | ICD-10-CM | POA: Diagnosis not present

## 2022-11-09 DIAGNOSIS — M9902 Segmental and somatic dysfunction of thoracic region: Secondary | ICD-10-CM | POA: Diagnosis not present

## 2022-11-09 DIAGNOSIS — M542 Cervicalgia: Secondary | ICD-10-CM | POA: Diagnosis not present

## 2022-11-11 ENCOUNTER — Encounter: Payer: Self-pay | Admitting: *Deleted

## 2022-11-11 ENCOUNTER — Other Ambulatory Visit: Payer: Self-pay

## 2022-11-11 DIAGNOSIS — K2100 Gastro-esophageal reflux disease with esophagitis, without bleeding: Secondary | ICD-10-CM

## 2022-11-11 DIAGNOSIS — J4520 Mild intermittent asthma, uncomplicated: Secondary | ICD-10-CM

## 2022-11-11 MED ORDER — PANTOPRAZOLE SODIUM 40 MG PO TBEC: 40.0000 mg | DELAYED_RELEASE_TABLET | Freq: Every day | ORAL | 1 refills | Status: DC

## 2022-11-13 ENCOUNTER — Encounter: Payer: Self-pay | Admitting: Nurse Practitioner

## 2022-11-13 ENCOUNTER — Ambulatory Visit (INDEPENDENT_AMBULATORY_CARE_PROVIDER_SITE_OTHER): Payer: Medicare Other | Admitting: Nurse Practitioner

## 2022-11-13 VITALS — BP 109/65 | HR 77 | Temp 98.4°F | Ht 65.0 in | Wt 125.0 lb

## 2022-11-13 DIAGNOSIS — R531 Weakness: Secondary | ICD-10-CM

## 2022-11-13 DIAGNOSIS — M8000XA Age-related osteoporosis with current pathological fracture, unspecified site, initial encounter for fracture: Secondary | ICD-10-CM | POA: Diagnosis not present

## 2022-11-13 NOTE — Assessment & Plan Note (Signed)
Completed b12, advised patient to increase hydration, follow up with Rheumatology, and keep cardiology appointment. Completed sleep study referral suggested by cardiology: pending results.

## 2022-11-13 NOTE — Progress Notes (Signed)
Established Patient Office Visit  Subjective   Patient ID: Kristin Wallace, female    DOB: October 19, 1956  Age: 67 y.o. MRN: [id number]  Chief Complaint  Patient presents with   Follow-up    ER    HPI  Fatigue  She reports new onset fatigue which she describes as a lack of energy. It began a few weeks ago and occurs all the time. It is described as moderate and staying constant. She has not started new medications around the time the fatigue started.   Associated symptoms: No arthralgias No bleeding  No melena No chest discomfort  No heart palpitations No heart racing   No dyspnea No feeling depressed  No feeling anxious or under stress No fevers  No loss of appetite No nausea  No vomiting No sleeping problems    Wt Readings from Last 3 Encounters:  11/13/22 125 lb (56.7 kg)  11/03/22 127 lb 9.6 oz (57.9 kg)  09/29/22 126 lb (57.2 kg)    Lab Results  Component Value Date   WBC 4.8 09/29/2022   HGB 13.2 09/29/2022   HCT 38.7 09/29/2022   MCV 88.6 09/29/2022   PLT 264 09/29/2022   Lab Results  Component Value Date   TSH 1.750 12/24/2021   Lab Results  Component Value Date   NA 142 09/29/2022   K 3.7 09/29/2022   CO2 31 09/29/2022   BUN 10 09/29/2022   CREATININE 0.53 09/29/2022   CALCIUM 9.8 09/29/2022   GLUCOSE 76 09/29/2022     Patient reports ongoing fatigue and weakness. Recently seen in the ED for chest pain and pressure. Patient reports since after COVID, she has not quite been feeling well. A Zio monitor was placed about a week ago and she returns back to cardiology in a few weeks.    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 01/18/2022   Episodic weakness/lightheadedness when upright few minutes better with drinking fluid 01/18/2022   Annual physical exam 12/24/2021   Erosive osteoarthritis of  both hands 12/23/2021   Hoarseness of voice 12/23/2021   Weakness 12/23/2021   Vitamin D deficiency 12/23/2021   Age-related osteoporosis without current pathological fracture 12/23/2021   Mild intermittent asthma without complication 35/46/5681   Gastroesophageal reflux disease with esophagitis without hemorrhage 10/13/2021   Establishing care with new doctor, encounter for 10/13/2021   Past Medical History:  Diagnosis Date   Arthritis    erosive osteoarthritis   Asthma    Atrial fibrillation (HCC)    Chronically dry eyes    Fatigue    chronic ( and weakness)   GERD (gastroesophageal reflux disease)    Lyme disease    Neuromuscular disorder (HCC)    neuropathy   Neuropathy    legs and feet   Osteoporosis    Past Surgical History:  Procedure Laterality Date   ABDOMINAL HYSTERECTOMY     BREAST SURGERY Left    neg tumor   COLONOSCOPY     COLONOSCOPY WITH PROPOFOL N/A 07/07/2022   Procedure: COLONOSCOPY WITH PROPOFOL;  Surgeon: Eloise Harman, DO;  Location: [facility];  Service: Endoscopy;  Laterality: N/A;  1:15pm, ASA 2, per office pt can't move up   ESOPHAGOGASTRODUODENOSCOPY     POLYPECTOMY  07/07/2022   Procedure: POLYPECTOMY;  Surgeon: Eloise Harman, DO;  Location: [facility];  Service: Endoscopy;;   RIGHT AND LEFT HEART CATH     TUBAL LIGATION     Social History   Tobacco Use   Smoking status: Never    Passive exposure: Never   Smokeless tobacco: Never  Vaping Use   Vaping Use: Never used  Substance Use Topics   Alcohol use: Yes    Comment: 3 yearly   Drug use: Never   Social History   Socioeconomic History   Marital status: Divorced    Spouse name: Not on file   Number of children: 3   Years of education: Not on file   Highest education level: Not on file  Occupational History   Occupation: retired  Tobacco Use   Smoking status: Never    Passive exposure: Never   Smokeless tobacco: Never  Vaping Use   Vaping Use: Never used   Substance and Sexual Activity   Alcohol use: Yes    Comment: 3 yearly   Drug use: Never   Sexual activity: Not on file  Other Topics Concern   Not on file  Social History Narrative   Moved here from [place] this year 07/27/2021.   Daughter lives with her   Right handed   Social Determinants of Health   Financial Resource Strain: Medium Risk (11/25/2021)   Overall Financial Resource Strain (CARDIA)    Difficulty of Paying Living Expenses: Somewhat hard  Food Insecurity: Food Insecurity Present (12/03/2021)   Hunger Vital Sign    Worried About Running Out of Food in the Last Year: Often true    Ran Out of Food in the Last Year: Sometimes true  Transportation Needs: No Transportation Needs (11/25/2021)   PRAPARE - Hydrologist (Medical): No    Lack of Transportation (Non-Medical): No  Physical Activity: Insufficiently Active (11/25/2021)   Exercise Vital Sign    Days of Exercise per Week: 3 days    Minutes of Exercise per Session: 30 min  Stress: No Stress Concern Present (11/25/2021)   [place]    Feeling of Stress : Only a little  Social Connections: Moderately Integrated (11/25/2021)   Social Connection and Isolation Panel [NHANES]    Frequency of Communication with Friends and Family: More than three times a week    Frequency of Social Gatherings with Friends and Family: Once a week    Attends Religious Services: More than 4 times per year    Active Member of [employer] or Organizations: Yes    Attends Archivist Meetings: More than 4 times per year    Marital Status: Divorced  Intimate Partner Violence: Not At Risk (11/25/2021)   Humiliation, Afraid, Rape, and Kick questionnaire    Fear of Current or Ex-Partner: No    Emotionally Abused: No    Physically Abused: No    Sexually Abused: No   Family Status  Relation Name Status   Mother  Deceased   Father  Deceased   Sister   Deceased   Brother  Deceased   Brother  Alive   Daughter  Alive   Daughter  Alive   Son  Alive   Neg Hx  (Not Specified)   Family History  Problem Relation Age of Onset   Depression Mother    Other Mother        suicide   Other Father        plane crash   Heart disease Sister  Cancer Brother    Cancer Brother        skin cancer   Depression Daughter    Thyroid disease Daughter    Migraines Daughter    Colon cancer Neg Hx    Allergies  Allergen Reactions   Shellfish Allergy Other (See Comments)    Vomiting       Review of Systems  Constitutional: Negative.  Negative for chills and fever.  HENT: Negative.    Respiratory: Negative.    Cardiovascular: Negative.   Gastrointestinal: Negative.   Genitourinary: Negative.   Musculoskeletal: Negative.   Skin: Negative.  Negative for rash.  Neurological:  Positive for weakness.  All other systems reviewed and are negative.     Objective:     BP 109/65   Pulse 77   Temp 98.4 F (36.9 C)   Ht '5\' 5"'$  (1.651 m)   Wt 125 lb (56.7 kg)   SpO2 100%   BMI 20.80 kg/m  BP Readings from Last 3 Encounters:  11/13/22 109/65  11/03/22 112/69  09/29/22 124/67   Wt Readings from Last 3 Encounters:  11/13/22 125 lb (56.7 kg)  11/03/22 127 lb 9.6 oz (57.9 kg)  09/29/22 126 lb (57.2 kg)      Physical Exam Vitals reviewed.  Constitutional:      Appearance: Normal appearance.  HENT:     Right Ear: External ear normal.     Left Ear: External ear normal.     Nose: Nose normal.  Eyes:     Conjunctiva/sclera: Conjunctivae normal.  Cardiovascular:     Rate and Rhythm: Rhythm irregular.     Pulses: Normal pulses.     Heart sounds: Normal heart sounds.     Comments: Zio monitor placed in the last week Pulmonary:     Effort: Pulmonary effort is normal.     Breath sounds: Normal breath sounds.  Abdominal:     General: Bowel sounds are normal.  Skin:    General: Skin is dry.     Findings: No erythema or rash.   Neurological:     General: No focal deficit present.     Mental Status: She is alert and oriented to person, place, and time.     Motor: Weakness present.  Psychiatric:        Mood and Affect: Mood normal.        Behavior: Behavior normal.      No results found for any visits on 11/13/22.  Last CBC Lab Results  Component Value Date   WBC 4.8 09/29/2022   HGB 13.2 09/29/2022   HCT 38.7 09/29/2022   MCV 88.6 09/29/2022   MCH 30.2 09/29/2022   RDW 12.9 09/29/2022   PLT 264 79/12/4095   Last metabolic panel Lab Results  Component Value Date   GLUCOSE 76 09/29/2022   NA 142 09/29/2022   K 3.7 09/29/2022   CL 104 09/29/2022   CO2 31 09/29/2022   BUN 10 09/29/2022   CREATININE 0.53 09/29/2022   EGFR 101 09/29/2022   CALCIUM 9.8 09/29/2022   PROT 7.0 09/29/2022   BILITOT 0.3 09/29/2022   AST 18 09/29/2022   ALT 15 09/29/2022      The 10-year ASCVD risk score (Arnett DK, et al., 2019) is: 4.9%    Assessment & Plan:   Problem List Items Addressed This Visit       Other   Weakness    Completed b12, advised patient to increase hydration, follow up with Rheumatology, and  keep cardiology appointment. Completed sleep study referral suggested by cardiology: pending results.       Relevant Orders   Vitamin B12   Other Visit Diagnoses     Age-related osteoporosis with current pathological fracture, initial encounter    -  Primary   Relevant Orders   Ambulatory referral to Sleep Studies       No follow-ups on file.    Ivy Lynn, NP

## 2022-11-13 NOTE — Patient Instructions (Signed)
Weakness Weakness is a lack of strength. You may feel weak all over your body (generalized), or you may feel weak in one part of your body (focal). Common causes of weakness include: Infection and disorders of the body's defense system (immune system). Physical exhaustion. Internal bleeding or other blood loss that results in a lack of red blood cells (anemia). Dehydration. An imbalance in mineral (electrolyte) levels, such as potassium. Chronic kidney or liver disease. Cancer. Other causes include: Some medicines or cancer treatment. Stress, anxiety, or depression. Heart disease, circulation problems, or stroke. Nervous system disorders. Thyroid disorders. Loss of muscle strength because of age or inactivity. Poor sleep quality or sleep disorders. The cause of your weakness may not be known. Some causes of weakness can be serious, so it is important to see your health care provider. Follow these instructions at home: Activity Rest as needed. Try to get enough sleep. Most adults need 7-8 hours of quality sleep each night. Talk to your health care provider about how much sleep you need. Do exercises, such as arm curls and leg raises, for 30 minutes at least 2 days a week or as told by your health care provider. This helps build muscle strength. Consider working with a physical therapist or trainer who can develop an exercise plan to help you gain muscle strength. General instructions  Take over-the-counter and prescription medicines only as told by your health care provider. Eat a healthy, well-balanced diet. This includes: Proteins to build muscles, such as lean meats and fish. Fresh fruits and vegetables. Carbohydrates to boost energy, such as whole grains. Drink enough fluid to keep your urine pale yellow. Keep all follow-up visits. This is important. Contact a health care provider if: Your weakness does not improve or gets worse. Your weakness affects your ability to think  clearly. Your weakness affects your ability to do your normal daily activities. Get help right away if: You develop sudden weakness, especially on one side of your face or body. You have chest pain. You have trouble breathing or shortness of breath. You have problems with your vision. You have trouble talking or swallowing. You have trouble standing or walking. You are light-headed or lose consciousness. These symptoms may be an emergency. Get help right away. Call 911. Do not wait to see if the symptoms will go away. Do not drive yourself to the hospital. Summary Weakness is a lack of strength. You may feel weak all over your body or just in one specific part of your body. Weakness can be caused by a variety of things. In some cases, the cause may be unknown. Rest as needed, and try to get enough sleep. Most adults need 7-8 hours of quality sleep each night. Eat a healthy, well-balanced diet. This information is not intended to replace advice given to you by your health care provider. Make sure you discuss any questions you have with your health care provider. Document Revised: 09/21/2021 Document Reviewed: 09/21/2021 Elsevier Patient Education  2023 Elsevier Inc.  

## 2022-11-14 LAB — VITAMIN B12: Vitamin B-12: >2000 pg/mL — ABNORMAL HIGH (ref 232–1245)

## 2022-11-18 DIAGNOSIS — R0789 Other chest pain: Secondary | ICD-10-CM | POA: Diagnosis not present

## 2022-11-18 DIAGNOSIS — I493 Ventricular premature depolarization: Secondary | ICD-10-CM | POA: Diagnosis not present

## 2022-11-18 DIAGNOSIS — I471 Supraventricular tachycardia, unspecified: Secondary | ICD-10-CM | POA: Diagnosis not present

## 2022-11-18 DIAGNOSIS — R9431 Abnormal electrocardiogram [ECG] [EKG]: Secondary | ICD-10-CM | POA: Diagnosis not present

## 2022-11-20 ENCOUNTER — Other Ambulatory Visit: Payer: Self-pay | Admitting: Nurse Practitioner

## 2022-11-20 DIAGNOSIS — J4520 Mild intermittent asthma, uncomplicated: Secondary | ICD-10-CM

## 2022-11-20 DIAGNOSIS — K2100 Gastro-esophageal reflux disease with esophagitis, without bleeding: Secondary | ICD-10-CM

## 2022-11-23 ENCOUNTER — Telehealth: Payer: Self-pay | Admitting: Internal Medicine

## 2022-11-23 NOTE — Telephone Encounter (Signed)
Pt called in with concerns regarding her medication, Plaquenil. She stated she recently seen a cardiologist and her EKG results were slightly abnormal. That doctor recommended her switching to a different prescription. Due to dental work it was best for her to receive infusions. She is still undergoing dental work at the time. She can be reached at [phone].

## 2022-11-26 ENCOUNTER — Ambulatory Visit (INDEPENDENT_AMBULATORY_CARE_PROVIDER_SITE_OTHER): Payer: 59

## 2022-11-26 VITALS — Ht 65.0 in | Wt 125.0 lb

## 2022-11-26 DIAGNOSIS — Z1231 Encounter for screening mammogram for malignant neoplasm of breast: Secondary | ICD-10-CM | POA: Diagnosis not present

## 2022-11-26 DIAGNOSIS — Z0000 Encounter for general adult medical examination without abnormal findings: Secondary | ICD-10-CM | POA: Diagnosis not present

## 2022-11-26 NOTE — Patient Instructions (Signed)
Kristin Wallace , Thank you for taking time to come for your Medicare Wellness Visit. I appreciate your ongoing commitment to your health goals. Please review the following plan we discussed and let me know if I can assist you in the future.   These are the goals we discussed:  Goals      Exercise 150 min/wk Moderate Activity        This is a list of the screening recommended for you and due dates:  Health Maintenance  Topic Date Due   COVID-19 Vaccine (3 - Moderna risk series) 06/14/2021   Pneumonia Vaccine (2 - PCV) 12/24/2022*   Flu Shot  01/31/2023*   Mammogram  12/24/2022   Medicare Annual Wellness Visit  11/27/2023   DEXA scan (bone density measurement)  12/25/2023   Colon Cancer Screening  07/08/2027   DTaP/Tdap/Td vaccine (3 - Td or Tdap) 04/06/2028   Hepatitis C Screening: USPSTF Recommendation to screen - Ages 66-79 yo.  Completed   Zoster (Shingles) Vaccine  Completed   HPV Vaccine  Aged Out  *Topic was postponed. The date shown is not the original due date.    Advanced directives: In Chart   Conditions/risks identified: Aim for 30 minutes of exercise or brisk walking, 6-8 glasses of water, and 5 servings of fruits and vegetables each day.   Next appointment: Follow up in one year for your annual wellness visit    Preventive Care 65 Years and Older, Female Preventive care refers to lifestyle choices and visits with your health care provider that can promote health and wellness. What does preventive care include? A yearly physical exam. This is also called an annual well check. Dental exams once or twice a year. Routine eye exams. Ask your health care provider how often you should have your eyes checked. Personal lifestyle choices, including: Daily care of your teeth and gums. Regular physical activity. Eating a healthy diet. Avoiding tobacco and drug use. Limiting alcohol use. Practicing safe sex. Taking low-dose aspirin every day. Taking vitamin and mineral  supplements as recommended by your health care provider. What happens during an annual well check? The services and screenings done by your health care provider during your annual well check will depend on your age, overall health, lifestyle risk factors, and family history of disease. Counseling  Your health care provider may ask you questions about your: Alcohol use. Tobacco use. Drug use. Emotional well-being. Home and relationship well-being. Sexual activity. Eating habits. History of falls. Memory and ability to understand (cognition). Work and work Statistician. Reproductive health. Screening  You may have the following tests or measurements: Height, weight, and BMI. Blood pressure. Lipid and cholesterol levels. These may be checked every 5 years, or more frequently if you are over 62 years old. Skin check. Lung cancer screening. You may have this screening every year starting at age 55 if you have a 30-pack-year history of smoking and currently smoke or have quit within the past 15 years. Fecal occult blood test (FOBT) of the stool. You may have this test every year starting at age 86. Flexible sigmoidoscopy or colonoscopy. You may have a sigmoidoscopy every 5 years or a colonoscopy every 10 years starting at age 7. Hepatitis C blood test. Hepatitis B blood test. Sexually transmitted disease (STD) testing. Diabetes screening. This is done by checking your blood sugar (glucose) after you have not eaten for a while (fasting). You may have this done every 1-3 years. Bone density scan. This is done to screen  for osteoporosis. You may have this done starting at age 59. Mammogram. This may be done every 1-2 years. Talk to your health care provider about how often you should have regular mammograms. Talk with your health care provider about your test results, treatment options, and if necessary, the need for more tests. Vaccines  Your health care provider may recommend certain  vaccines, such as: Influenza vaccine. This is recommended every year. Tetanus, diphtheria, and acellular pertussis (Tdap, Td) vaccine. You may need a Td booster every 10 years. Zoster vaccine. You may need this after age 14. Pneumococcal 13-valent conjugate (PCV13) vaccine. One dose is recommended after age 64. Pneumococcal polysaccharide (PPSV23) vaccine. One dose is recommended after age 22. Talk to your health care provider about which screenings and vaccines you need and how often you need them. This information is not intended to replace advice given to you by your health care provider. Make sure you discuss any questions you have with your health care provider. Document Released: 11/15/2015 Document Revised: 07/08/2016 Document Reviewed: 08/20/2015 Elsevier Interactive Patient Education  2017 [employer] Prevention in the Home Falls can cause injuries. They can happen to people of all ages. There are many things you can do to make your home safe and to help prevent falls. What can I do on the outside of my home? Regularly fix the edges of walkways and driveways and fix any cracks. Remove anything that might make you trip as you walk through a door, such as a raised step or threshold. Trim any bushes or trees on the path to your home. Use bright outdoor lighting. Clear any walking paths of anything that might make someone trip, such as rocks or tools. Regularly check to see if handrails are loose or broken. Make sure that both sides of any steps have handrails. Any raised decks and porches should have guardrails on the edges. Have any leaves, snow, or ice cleared regularly. Use sand or salt on walking paths during winter. Clean up any spills in your garage right away. This includes oil or grease spills. What can I do in the bathroom? Use night lights. Install grab bars by the toilet and in the tub and shower. Do not use towel bars as grab bars. Use non-skid mats or decals in  the tub or shower. If you need to sit down in the shower, use a plastic, non-slip stool. Keep the floor dry. Clean up any water that spills on the floor as soon as it happens. Remove soap buildup in the tub or shower regularly. Attach bath mats securely with double-sided non-slip rug tape. Do not have throw rugs and other things on the floor that can make you trip. What can I do in the bedroom? Use night lights. Make sure that you have a light by your bed that is easy to reach. Do not use any sheets or blankets that are too big for your bed. They should not hang down onto the floor. Have a firm chair that has side arms. You can use this for support while you get dressed. Do not have throw rugs and other things on the floor that can make you trip. What can I do in the kitchen? Clean up any spills right away. Avoid walking on wet floors. Keep items that you use a lot in easy-to-reach places. If you need to reach something above you, use a strong step stool that has a grab bar. Keep electrical cords out of the way. Do  not use floor polish or wax that makes floors slippery. If you must use wax, use non-skid floor wax. Do not have throw rugs and other things on the floor that can make you trip. What can I do with my stairs? Do not leave any items on the stairs. Make sure that there are handrails on both sides of the stairs and use them. Fix handrails that are broken or loose. Make sure that handrails are as long as the stairways. Check any carpeting to make sure that it is firmly attached to the stairs. Fix any carpet that is loose or worn. Avoid having throw rugs at the top or bottom of the stairs. If you do have throw rugs, attach them to the floor with carpet tape. Make sure that you have a light switch at the top of the stairs and the bottom of the stairs. If you do not have them, ask someone to add them for you. What else can I do to help prevent falls? Wear shoes that: Do not have high  heels. Have rubber bottoms. Are comfortable and fit you well. Are closed at the toe. Do not wear sandals. If you use a stepladder: Make sure that it is fully opened. Do not climb a closed stepladder. Make sure that both sides of the stepladder are locked into place. Ask someone to hold it for you, if possible. Clearly mark and make sure that you can see: Any grab bars or handrails. First and last steps. Where the edge of each step is. Use tools that help you move around (mobility aids) if they are needed. These include: Canes. Walkers. Scooters. Crutches. Turn on the lights when you go into a dark area. Replace any light bulbs as soon as they burn out. Set up your furniture so you have a clear path. Avoid moving your furniture around. If any of your floors are uneven, fix them. If there are any pets around you, be aware of where they are. Review your medicines with your doctor. Some medicines can make you feel dizzy. This can increase your chance of falling. Ask your doctor what other things that you can do to help prevent falls. This information is not intended to replace advice given to you by your health care provider. Make sure you discuss any questions you have with your health care provider. Document Released: 08/15/2009 Document Revised: 03/26/2016 Document Reviewed: 11/23/2014 Elsevier Interactive Patient Education  2017 [employer].

## 2022-11-26 NOTE — Progress Notes (Signed)
Subjective:   Kristin Wallace is a 67 y.o. female who presents for an Initial Medicare Annual Wellness Visit. I connected with  [name] on 11/26/22 by a audio enabled telemedicine application and verified that I am speaking with the correct person using two identifiers.  Patient Location: Home  Provider Location: Home Office  I discussed the limitations of evaluation and management by telemedicine. The patient expressed understanding and agreed to proceed.  Review of Systems     Cardiac Risk Factors include: advanced age (>22mn, >>61women)     Objective:    Today's Vitals   11/26/22 1404  Weight: 125 lb (56.7 kg)  Height: '5\' 5"'$  (1.651 m)   Body mass index is 20.8 kg/m.     11/26/2022    2:09 PM 07/07/2022   11:12 AM 11/25/2021    4:01 PM  Advanced Directives  Does Patient Have a Medical Advance Directive? Yes Yes Yes  Type of [place] Living will;Healthcare Power of [place] Does patient want to make changes to medical advance directive? No - Patient declined    Copy of H[place] Chart? Yes - validated most recent copy scanned in chart (See row information) No - copy requested Yes - validated most recent copy scanned in chart (See row information)    Current Medications (verified) Outpatient Encounter Medications as of 11/26/2022  Medication Sig   Ascorbic Acid (VITAMIN C) 1000 MG tablet Take 1,000 mg by mouth in the morning.   Calcium Carb-Cholecalciferol (CALCIUM 600+D3 PO) Take 1 tablet by mouth every morning.   carboxymethylcellul-glycerin (OPTIVE) 0.5-0.9 % ophthalmic solution Place 1 drop into both eyes 2 (two) times daily as needed for dry eyes.   Cholecalciferol (VITAMIN D3) 125 MCG (5000 UT) CAPS Take 5,000 Units by mouth in the morning.   CINNAMON PO Take 1 Dose by mouth See admin instructions. 1 teaspoonful twice daily with 2 teaspoonful of honey    Cyanocobalamin (B-12) 2500 MCG TABS Take 2,500 mcg by mouth in the morning and at bedtime.   cycloSPORINE (RESTASIS) 0.05 % ophthalmic emulsion Place 2 drops into both eyes 2 (two) times daily.   hydroxychloroquine (PLAQUENIL) 200 MG tablet Take 1 tablet by mouth twice daily   montelukast (SINGULAIR) 10 MG tablet Take 1 tablet (10 mg total) by mouth at bedtime. (NEEDS TO BE SEEN BEFORE NEXT REFILL)   Multiple Vitamins-Minerals (DRY EYE FORMULA) CAPS Take 3 capsules by mouth in the morning.   Multiple Vitamins-Minerals (MULTIVITAMIN WOMEN 50+) TABS Take 1 tablet by mouth in the morning.   pantoprazole (PROTONIX) 40 MG tablet Take 1 tablet (40 mg total) by mouth daily.   pregabalin (LYRICA) 75 MG capsule Take 75 mg by mouth 2 (two) times daily.   PSYLLIUM HUSK PO Take 1 Dose by mouth in the morning.   raloxifene (EVISTA) 60 MG tablet Take 1 tablet (60 mg total) by mouth daily.   SODIUM FLUORIDE 5000 PPM 1.1 % PSTE Take by mouth.   TURMERIC CURCUMIN PO Take 1 capsule by mouth in the morning and at bedtime. W/Ginger Powder   budesonide-formoterol (SYMBICORT) 160-4.5 MCG/ACT inhaler Inhale 1 puff into the lungs 2 (two) times daily.   No facility-administered encounter medications on file as of 11/26/2022.    Allergies (verified) Shellfish allergy   History: Past Medical History:  Diagnosis Date   Arthritis    erosive osteoarthritis   Asthma    Atrial fibrillation (  [place])    Chronically dry eyes    Fatigue    chronic ( and weakness)   GERD (gastroesophageal reflux disease)    Lyme disease    Neuromuscular disorder (HCC)    neuropathy   Neuropathy    legs and feet   Osteoporosis    Past Surgical History:  Procedure Laterality Date   ABDOMINAL HYSTERECTOMY     BREAST SURGERY Left    neg tumor   COLONOSCOPY     COLONOSCOPY WITH PROPOFOL N/A 07/07/2022   Procedure: COLONOSCOPY WITH PROPOFOL;  Surgeon: Eloise Harman, DO;  [facility];  Service: Endoscopy;  Laterality:  N/A;  1:15pm, ASA 2, per office pt can't move up   ESOPHAGOGASTRODUODENOSCOPY     POLYPECTOMY  07/07/2022   Procedure: POLYPECTOMY;  Surgeon: Eloise Harman, DO;  Location: [facility];  Service: Endoscopy;;   RIGHT AND LEFT HEART CATH     TUBAL LIGATION     Family History  Problem Relation Age of Onset   Depression Mother    Other Mother        suicide   Other Father        plane crash   Heart disease Sister    Cancer Brother    Cancer Brother        skin cancer   Depression Daughter    Thyroid disease Daughter    Migraines Daughter    Colon cancer Neg Hx    Social History   Socioeconomic History   Marital status: Divorced    Spouse name: Not on file   Number of children: 3   Years of education: Not on file   Highest education level: Not on file  Occupational History   Occupation: retired  Tobacco Use   Smoking status: Never    Passive exposure: Never   Smokeless tobacco: Never  Vaping Use   Vaping Use: Never used  Substance and Sexual Activity   Alcohol use: Yes    Comment: 3 yearly   Drug use: Never   Sexual activity: Not on file  Other Topics Concern   Not on file  Social History Narrative   Moved here from [place] this year 07/27/2021.   Daughter lives with her   Right handed   Social Determinants of Health   Financial Resource Strain: Low Risk  (11/26/2022)   Overall Financial Resource Strain (CARDIA)    Difficulty of Paying Living Expenses: Not hard at all  Food Insecurity: No Food Insecurity (11/26/2022)   Hunger Vital Sign    Worried About Running Out of Food in the Last Year: Never true    Ran Out of Food in the Last Year: Never true  Transportation Needs: No Transportation Needs (11/26/2022)   PRAPARE - Hydrologist (Medical): No    Lack of Transportation (Non-Medical): No  Physical Activity: Sufficiently Active (11/26/2022)   Exercise Vital Sign    Days of Exercise per Week: 5 days    Minutes of Exercise per  Session: 30 min  Stress: No Stress Concern Present (11/26/2022)   [place]    Feeling of Stress : Not at all  Social Connections: Moderately Integrated (11/26/2022)   Social Connection and Isolation Panel [NHANES]    Frequency of Communication with Friends and Family: More than three times a week    Frequency of Social Gatherings with Friends and Family: More than three times a week  Attends Religious Services: More than 4 times per year    Active Member of Clubs or Organizations: Yes    Attends Music therapist: More than 4 times per year    Marital Status: Divorced    Tobacco Counseling Counseling given: Not Answered   Clinical Intake:  Pre-visit preparation completed: Yes  Pain : No/denies pain     Nutritional Risks: None Diabetes: No  How often do you need to have someone help you when you read instructions, pamphlets, or other written materials from your doctor or pharmacy?: 1 - Never  Diabetic?no   Interpreter Needed?: No  Information entered by :: Jadene Pierini, LPN   Activities of Daily Living    11/26/2022    2:09 PM 11/22/2022    8:48 AM  In your present state of health, do you have any difficulty performing the following activities:  Hearing? 0 0  Vision? 0 0  Difficulty concentrating or making decisions? 0 1  Walking or climbing stairs? 0 0  Dressing or bathing? 0 0  Doing errands, shopping? 0 0  Preparing Food and eating ? N N  Using the Toilet? N N  In the past six months, have you accidently leaked urine? N Y  Do you have problems with loss of bowel control? N N  Managing your Medications? N N  Managing your Finances? N N  Housekeeping or managing your Housekeeping? N N    Patient Care Team: Baruch Gouty, FNP as PCP - General (Family Medicine)  Indicate any recent Medical Services you may have received from other than [facility] providers in the past year (date may be  approximate).     Assessment:   This is a routine wellness examination for [place].  Hearing/Vision screen Vision Screening - Comments:: Wears rx glasses - up to date with routine eye exams with  Dr.Groat  Dietary issues and exercise activities discussed: Current Exercise Habits: Home exercise routine, Type of exercise: walking, Time (Minutes): 30, Frequency (Times/Week): 5, Weekly Exercise (Minutes/Week): 150, Intensity: Mild, Exercise limited by: None identified   Goals Addressed             This Visit's Progress    Exercise 150 min/wk Moderate Activity   On track      Depression Screen    11/26/2022    2:07 PM 11/13/2022   12:23 PM 06/23/2022    3:23 PM 05/04/2022    2:17 PM 12/24/2021    9:04 AM 11/25/2021    3:30 PM 10/13/2021    2:36 PM  PHQ 2/9 Scores  PHQ - 2 Score 0 0 1 0 0 1 1  PHQ- 9 Score 0 0   0 4 6    Fall Risk    11/26/2022    2:05 PM 11/22/2022    8:48 AM 06/23/2022    3:23 PM 12/24/2021    9:04 AM 11/25/2021    4:00 PM  Fall Risk   Falls in the past year? 0 0 0 0 0  Number falls in past yr: 0   0 0  Injury with Fall? 0   0 0  Risk for fall due to : No Fall Risks   No Fall Risks Orthopedic patient  Follow up Falls prevention discussed    Falls prevention discussed    FALL RISK PREVENTION PERTAINING TO THE HOME:  Any stairs in or around the home? No  If so, are there any without handrails? No  Home free of loose throw  rugs in walkways, pet beds, electrical cords, etc? Yes  Adequate lighting in your home to reduce risk of falls? Yes   ASSISTIVE DEVICES UTILIZED TO PREVENT FALLS:  Life alert? No  Use of a cane, walker or w/c? Yes  Grab bars in the bathroom? No  Shower chair or bench in shower? Yes  Elevated toilet seat or a handicapped toilet? Yes        11/26/2022    2:09 PM 11/25/2021    3:34 PM  6CIT Screen  What Year? 0 points 0 points  What month? 0 points 0 points  What time? 0 points 0 points  Count back from 20 0 points 0 points   Months in reverse 0 points 2 points  Repeat phrase 0 points 2 points  Total Score 0 points 4 points    Immunizations Immunization History  Administered Date(s) Administered   Influenza Split 09/02/2013   Moderna SARS-COV2 Booster Vaccination 09/06/2020, 05/17/2021   Moderna Sars-Covid-2 Vaccination 11/13/2019, 12/11/2019   Pneumococcal Polysaccharide-23 01/02/2021   Td 01/17/2010   Tdap 04/06/2018   Zoster Recombinat (Shingrix) 09/18/2020, 01/03/2021    TDAP status: Up to date  Flu Vaccine status: Up to date  Pneumococcal vaccine status: Up to date  Covid-19 vaccine status: Completed vaccines  Qualifies for Shingles Vaccine? Yes   Zostavax completed Yes   Shingrix Completed?: Yes  Screening Tests Health Maintenance  Topic Date Due   COVID-19 Vaccine (3 - Moderna risk series) 06/14/2021   Pneumonia Vaccine 42+ Years old (2 - PCV) 12/24/2022 (Originally 01/02/2022)   INFLUENZA VACCINE  01/31/2023 (Originally 06/02/2022)   MAMMOGRAM  12/24/2022   Medicare Annual Wellness (AWV)  11/27/2023   DEXA SCAN  12/25/2023   COLONOSCOPY (Pts 45-51yr Insurance coverage will need to be confirmed)  07/08/2027   DTaP/Tdap/Td (3 - Td or Tdap) 04/06/2028   Hepatitis C Screening  Completed   Zoster Vaccines- Shingrix  Completed   HPV VACCINES  Aged Out    Health Maintenance  Health Maintenance Due  Topic Date Due   COVID-19 Vaccine (3 - Moderna risk series) 06/14/2021    Colorectal cancer screening: Type of screening: Colonoscopy. Completed 07/07/2022. Repeat every 5 years  Mammogram status: Completed 12/24/2021. Repeat every year  Bone Density status: Completed 12/24/2021. Results reflect: Bone density results: OSTEOPENIA. Repeat every 5 years.  Lung Cancer Screening: (Low Dose CT Chest recommended if Age 69-80years, 30 pack-year currently smoking OR have quit w/in 15years.) does not qualify.   Lung Cancer Screening Referral: n/a  Additional Screening:  Hepatitis C  Screening: does not qualify;   Vision Screening: Recommended annual ophthalmology exams for early detection of glaucoma and other disorders of the eye. Is the patient up to date with their annual eye exam?  Yes  Who is the provider or what is the name of the office in which the patient attends annual eye exams? Dr.Groat If pt is not established with a provider, would they like to be referred to a provider to establish care? No .   Dental Screening: Recommended annual dental exams for proper oral hygiene  Community Resource Referral / Chronic Care Management: CRR required this visit?  No   CCM required this visit?  No      Plan:     I have personally reviewed and noted the following in the patient's chart:   Medical and social history Use of alcohol, tobacco or illicit drugs  Current medications and supplements including opioid prescriptions. Patient is not  currently taking opioid prescriptions. Functional ability and status Nutritional status Physical activity Advanced directives List of other physicians Hospitalizations, surgeries, and ER visits in previous 12 months Vitals Screenings to include cognitive, depression, and falls Referrals and appointments  In addition, I have reviewed and discussed with patient certain preventive protocols, quality metrics, and best practice recommendations. A written personalized care plan for preventive services as well as general preventive health recommendations were provided to patient.     Daphane Shepherd, LPN   1/98/0221   Nurse Notes: none

## 2022-12-03 ENCOUNTER — Telehealth: Payer: Self-pay | Admitting: Family Medicine

## 2022-12-03 NOTE — Telephone Encounter (Signed)
Pt was made aware that her referral for sleep study was denied because she has already been referred to pulmonology for sleep. Pt wants to make sure PCP is aware and wants to know if we are supposed to send order for sleep study to them or can she just call them to request sleep study be done?   If we do have to fax order to them, then their fax # is [phone]

## 2022-12-03 NOTE — Telephone Encounter (Signed)
Patient aware and verbalizes understanding. 

## 2022-12-04 ENCOUNTER — Ambulatory Visit (INDEPENDENT_AMBULATORY_CARE_PROVIDER_SITE_OTHER): Payer: 59 | Admitting: Nurse Practitioner

## 2022-12-04 ENCOUNTER — Encounter: Payer: Self-pay | Admitting: Nurse Practitioner

## 2022-12-04 VITALS — BP 110/63 | HR 74 | Temp 97.8°F | Resp 20 | Ht 65.0 in | Wt 125.0 lb

## 2022-12-04 DIAGNOSIS — G629 Polyneuropathy, unspecified: Secondary | ICD-10-CM

## 2022-12-04 MED ORDER — PREGABALIN 150 MG PO CAPS: 150.0000 mg | ORAL_CAPSULE | Freq: Two times a day (BID) | ORAL | 1 refills | Status: DC

## 2022-12-04 NOTE — Progress Notes (Signed)
   Subjective:    Patient ID: Kristin Wallace, female    DOB: 14-Aug-1956, 67 y.o.   MRN: [id number]   Chief Complaint: Bilateral feet and ankle swelling (Feels like pounding sensation in feet)   HPI Patient c/o swelling bil feet an ankles. She has neuropathy in bil feet and seems to be worsening.  Shocking pain through toes with pounding feeling dorsal surface of bil feet. Pain increases with walking or standing. Rates pain 4/10 currently while she is sitting.    Review of Systems  Constitutional:  Negative for diaphoresis.  Eyes:  Negative for pain.  Respiratory:  Negative for shortness of breath.   Cardiovascular:  Negative for chest pain, palpitations and leg swelling.  Gastrointestinal:  Negative for abdominal pain.  Endocrine: Negative for polydipsia.  Skin:  Negative for rash.  Neurological:  Negative for dizziness, weakness and headaches.  Hematological:  Does not bruise/bleed easily.  All other systems reviewed and are negative.      Objective:   Physical Exam Constitutional:      Appearance: Normal appearance.  Cardiovascular:     Rate and Rhythm: Normal rate and regular rhythm.     Heart sounds: Normal heart sounds.  Pulmonary:     Breath sounds: Normal breath sounds.  Musculoskeletal:     Right lower leg: No edema.     Left lower leg: No edema.     Comments: Slight numbness to palpation on all toes bil. No point tenderness on either feet  Skin:    General: Skin is warm.  Neurological:     General: No focal deficit present.     Mental Status: She is alert and oriented to person, place, and time.  Psychiatric:        Mood and Affect: Mood normal.        Behavior: Behavior normal.     BP 110/63   Pulse 74   Temp 97.8 F (36.6 C) (Temporal)   Resp 20   Ht '5\' 5"'$  (1.651 m)   Wt 125 lb (56.7 kg)   SpO2 98%   BMI 20.80 kg/m        Assessment & Plan:   Kristin Wallace in today with chief complaint of Bilateral feet and ankle swelling (Feels like  pounding sensation in feet)   1. 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  Dispense: 60 capsule; Refill: 1    The above assessment and management plan was discussed with the patient. The patient verbalized understanding of and has agreed to the management plan. Patient is aware to call the clinic if symptoms persist or worsen. Patient is aware when to return to the clinic for a follow-up visit. Patient educated on when it is appropriate to go to the emergency department.   Mary-Margaret Hassell Done, FNP

## 2022-12-04 NOTE — Patient Instructions (Signed)
Neuropathic Pain Neuropathic pain is pain caused by damage to the nerves that are responsible for certain sensations in your body (sensory nerves). Neuropathic pain can make you more sensitive to pain. Even a minor sensation can feel very painful. This is usually a long-term (chronic) condition that can be difficult to treat. The type of pain differs from person to person. It may: Start suddenly (acute), or it may develop slowly and become chronic. Come and go as damaged nerves heal, or it may stay at the same level for years. Cause emotional distress, loss of sleep, and a lower quality of life. What are the causes? The most common cause of this condition is diabetes. Many other diseases and conditions can also cause neuropathic pain. Causes of neuropathic pain can be classified as: Toxic. This is caused by medicines and chemicals. The most common causes of toxic neuropathic pain is damage from medicines that kill cancer cells (chemotherapy) or alcohol abuse. Metabolic. This can be caused by: Diabetes. Lack of vitamins like B12. Traumatic. Any injury that cuts, crushes, or stretches a nerve can cause damage and pain. Compression-related. If a sensory nerve gets trapped or compressed for a long period of time, the blood supply to the nerve can be cut off. Vascular. Many blood vessel diseases can cause neuropathic pain by decreasing blood supply and oxygen to nerves. Autoimmune. This type of pain results from diseases in which the body's defense system (immune system) mistakenly attacks sensory nerves. Examples of autoimmune diseases that can cause neuropathic pain include lupus and multiple sclerosis. Infectious. Many types of viral infections can damage sensory nerves and cause pain. Shingles infection is a common cause of this type of pain. Inherited. Neuropathic pain can be a symptom of many diseases that are passed down through families (genetic). What increases the risk? You are more likely to  develop this condition if: You have diabetes. You smoke. You drink too much alcohol. You are taking certain medicines, including chemotherapy or medicines that treat immune system disorders. What are the signs or symptoms? The main symptom is pain. Neuropathic pain is often described as: Burning. Shock-like. Stinging. Hot or cold. Itching. How is this diagnosed? No single test can diagnose neuropathic pain. It is diagnosed based on: A physical exam and your symptoms. Your health care provider will ask you about your pain. You may be asked to use a pain scale to describe how bad your pain is. Tests. These may be done to see if you have a cause and location of any nerve damage. They include: Nerve conduction studies and electromyography to test how well nerve signals travel through your nerves and muscles (electrodiagnostic testing). Skin biopsy to evaluate for small fiber neuropathy. Imaging studies, such as: X-rays. CT scan. MRI. How is this treated? Treatment for neuropathic pain may change over time. You may need to try different treatment options or a combination of treatments. Some options include: Treating the underlying cause of the neuropathy, such as diabetes, kidney disease, or vitamin deficiencies. Stopping medicines that can cause neuropathy, such as chemotherapy. Medicine to relieve pain. Medicines may include: Prescription or over-the-counter pain medicine. Anti-seizure medicine. Antidepressant medicines. Pain-relieving patches or creams that are applied to painful areas of skin. A medicine to numb the area (local anesthetic), which can be injected as a nerve block. Transcutaneous nerve stimulation. This uses electrical currents to block painful nerve signals. The treatment is painless. Alternative treatments, such as: Acupuncture. Meditation. Massage. Occupational or physical therapy. Pain management programs. Counseling. Follow  these instructions at  home: Medicines  Take over-the-counter and prescription medicines only as told by your health care provider. Ask your health care provider if the medicine prescribed to you: Requires you to avoid driving or using machinery. Can cause constipation. You may need to take these actions to prevent or treat constipation: Drink enough fluid to keep your urine pale yellow. Take over-the-counter or prescription medicines. Eat foods that are high in fiber, such as beans, whole grains, and fresh fruits and vegetables. Limit foods that are high in fat and processed sugars, such as fried or sweet foods. Lifestyle  Have a good support system at home. Consider joining a chronic pain support group. Do not use any products that contain nicotine or tobacco. These products include cigarettes, chewing tobacco, and vaping devices, such as e-cigarettes. If you need help quitting, ask your health care provider. Do not drink alcohol. General instructions Learn as much as you can about your condition. Work closely with all your health care providers to find the treatment plan that works best for you. Ask your health care provider what activities are safe for you. Keep all follow-up visits. This is important. Contact a health care provider if: Your pain treatments are not working. You are having side effects from your medicines. You are struggling with tiredness (fatigue), mood changes, depression, or anxiety. Get help right away if: You have thoughts of hurting yourself. Get help right away if you feel like you may hurt yourself or others, or have thoughts about taking your own life. Go to your nearest emergency room or: Call 911. Call the National Suicide Prevention Lifeline at 1-800-273-8255 or 988. This is open 24 hours a day. Text the Crisis Text Line at 741741. Summary Neuropathic pain is pain caused by damage to the nerves that are responsible for certain sensations in your body (sensory  nerves). Neuropathic pain may come and go as damaged nerves heal, or it may stay at the same level for years. Neuropathic pain is usually a long-term condition that can be difficult to treat. Consider joining a chronic pain support group. This information is not intended to replace advice given to you by your health care provider. Make sure you discuss any questions you have with your health care provider. Document Revised: 06/16/2021 Document Reviewed: 06/16/2021 Elsevier Patient Education  2023 [employer] Inc.  

## 2022-12-07 DIAGNOSIS — M9902 Segmental and somatic dysfunction of thoracic region: Secondary | ICD-10-CM | POA: Diagnosis not present

## 2022-12-07 DIAGNOSIS — M542 Cervicalgia: Secondary | ICD-10-CM | POA: Diagnosis not present

## 2022-12-07 DIAGNOSIS — M9901 Segmental and somatic dysfunction of cervical region: Secondary | ICD-10-CM | POA: Diagnosis not present

## 2022-12-07 DIAGNOSIS — M546 Pain in thoracic spine: Secondary | ICD-10-CM | POA: Diagnosis not present

## 2022-12-07 DIAGNOSIS — M9903 Segmental and somatic dysfunction of lumbar region: Secondary | ICD-10-CM | POA: Diagnosis not present

## 2022-12-08 ENCOUNTER — Encounter: Payer: Self-pay | Admitting: Internal Medicine

## 2022-12-08 ENCOUNTER — Telehealth: Payer: Self-pay | Admitting: Internal Medicine

## 2022-12-08 ENCOUNTER — Other Ambulatory Visit: Payer: Self-pay | Admitting: *Deleted

## 2022-12-08 MED ORDER — RALOXIFENE HCL 60 MG PO TABS: 60.0000 mg | ORAL_TABLET | Freq: Every day | ORAL | 0 refills | Status: DC

## 2022-12-08 NOTE — Telephone Encounter (Signed)
I discussed with Kristin Wallace these findings. I recommend she stop taking HCQ for now and she has cardiology follow up planned at the end of the month. They should be able to recheck an EKG after 3 weeks off HCQ and if it was causing QT prolongation or other defect should have some improvement. She is a good candidate for reclast for osteoporosis treatment due to chronic dysphagia not a good candidate for oral bisphosphonates. I recommended we schedule a telemedicine appointment to discuss this medication and treatment options. Please contact her about setting this up.

## 2022-12-08 NOTE — Telephone Encounter (Signed)
Pt is requesting a call from Dr. Benjamine Mola regarding if she can begin infusions and come off of Plaquenil. She recently had an abnormal EKG and her doctor believes it may be the medication. Pt stated she has completed her dental work and is ready for infusions. Kristin Wallace can be reached at [phone].

## 2022-12-08 NOTE — Addendum Note (Signed)
Addended by: Collier Salina on: 12/08/2022 02:03 PM   Modules accepted: Orders

## 2022-12-09 ENCOUNTER — Telehealth: Payer: Self-pay | Admitting: General Practice

## 2022-12-09 NOTE — Telephone Encounter (Signed)
Left VM to schedule MyChart visit with Dr. Benjamine Mola

## 2022-12-10 ENCOUNTER — Ambulatory Visit: Payer: Medicaid Other | Admitting: Internal Medicine

## 2022-12-10 NOTE — Telephone Encounter (Signed)
Please schedule Kristin Wallace for a clinic or telemedicine follow up to discuss starting reclast infusions for osteoporosis

## 2022-12-10 NOTE — Telephone Encounter (Signed)
Kristin Wallace would be a good candidate for reclast and is interested in this. She has dysphagia so not a good alendronate candidate and has not improve her density much on just raloxifene so far. I discussed her osteoporosis most recently in office note from November.  Would we need a newer clinical document for this or anything else to get her approved? Thanks.

## 2022-12-11 ENCOUNTER — Other Ambulatory Visit: Payer: Self-pay | Admitting: *Deleted

## 2022-12-11 DIAGNOSIS — G629 Polyneuropathy, unspecified: Secondary | ICD-10-CM

## 2022-12-16 DIAGNOSIS — M79674 Pain in right toe(s): Secondary | ICD-10-CM | POA: Diagnosis not present

## 2022-12-16 DIAGNOSIS — M79671 Pain in right foot: Secondary | ICD-10-CM | POA: Diagnosis not present

## 2022-12-16 DIAGNOSIS — L600 Ingrowing nail: Secondary | ICD-10-CM | POA: Diagnosis not present

## 2022-12-22 ENCOUNTER — Encounter: Payer: Self-pay | Admitting: Internal Medicine

## 2022-12-22 ENCOUNTER — Ambulatory Visit: Payer: 59 | Attending: Internal Medicine | Admitting: Internal Medicine

## 2022-12-22 VITALS — Ht 65.75 in

## 2022-12-22 DIAGNOSIS — M810 Age-related osteoporosis without current pathological fracture: Secondary | ICD-10-CM | POA: Insufficient documentation

## 2022-12-22 NOTE — Progress Notes (Signed)
Virtual Visit via Video Note  I connected with Kristin Wallace on 12/22/22 at 11:20 AM EST by a video enabled telemedicine application and verified that I am speaking with the correct person using two identifiers.  Review of Systems  Constitutional:  Positive for malaise/fatigue.  HENT:  Negative for congestion.   Eyes:  Negative for redness.  Respiratory:  Negative for shortness of breath.   Cardiovascular:  Negative for chest pain and palpitations.  Gastrointestinal:  Negative for diarrhea.  Genitourinary:  Negative for urgency.  Musculoskeletal:  Positive for joint pain and myalgias.  Skin:  Negative for rash.  Neurological:  Positive for weakness.  Endo/Heme/Allergies:  Bruises/bleeds easily.  Psychiatric/Behavioral:  Negative for depression. The patient is nervous/anxious.      Location: Patient: At home [place], [place] Provider: At [facility] Rheumatology office [place], [place]   I discussed the limitations of evaluation and management by telemedicine and the availability of in person appointments. The patient expressed understanding and agreed to proceed.  History of Present Illness: Following up today to discuss treatment plan for osteoporosis.  She has previously been on raloxifene and vitamin D supplementation.  Previously delayed starting antiresorptive therapy while completing dental procedures, and she now has no more tooth extraction or implantation planned.  We have also been discussing her symptoms of erosive osteoarthritis but not currently in exacerbation do not believe directly related to the osteoporosis.   Observations/Objective: Most recent bone density result reviewed from February 22 with lumbar spine T-score -3.3 consistent with osteoporosis.  Physical Exam Pulmonary:     Effort: Pulmonary effort is normal.  Neurological:     Mental Status: She is alert.  Psychiatric:        Mood and Affect: Mood normal.      Assessment and Plan: Start treatment with Reclast  infusion 5 mg IV annually for osteoporosis.  She has issues with chronic dysphagia so is not a good candidate for oral bisphosphonate therapy.  Bone density has not adequately improved with long-term use of raloxifene.  Does not have major red flags or severe disease indicating need for anabolic medication.  She would also be candidate for Prolia as an alternative if intolerant or failure to respond to treatment.  Follow Up Instructions:    I discussed the assessment and treatment plan with the patient. The patient was provided an opportunity to ask questions and all were answered. The patient agreed with the plan and demonstrated an understanding of the instructions.   The patient was advised to call back or seek an in-person evaluation if the symptoms worsen or if the condition fails to improve as anticipated.  I provided 9 minutes of non-face-to-face time during this encounter.  Dr. Vernelle Emerald

## 2022-12-28 DIAGNOSIS — R29818 Other symptoms and signs involving the nervous system: Secondary | ICD-10-CM | POA: Diagnosis not present

## 2022-12-28 DIAGNOSIS — R0789 Other chest pain: Secondary | ICD-10-CM | POA: Diagnosis not present

## 2022-12-28 DIAGNOSIS — R002 Palpitations: Secondary | ICD-10-CM | POA: Diagnosis not present

## 2022-12-30 ENCOUNTER — Ambulatory Visit: Payer: Medicare Other | Admitting: Nurse Practitioner

## 2022-12-30 ENCOUNTER — Ambulatory Visit (INDEPENDENT_AMBULATORY_CARE_PROVIDER_SITE_OTHER): Payer: 59 | Admitting: Family Medicine

## 2022-12-30 ENCOUNTER — Encounter: Payer: Self-pay | Admitting: Family Medicine

## 2022-12-30 VITALS — BP 100/60 | HR 76 | Temp 98.1°F | Ht 65.75 in | Wt 129.8 lb

## 2022-12-30 DIAGNOSIS — L03031 Cellulitis of right toe: Secondary | ICD-10-CM

## 2022-12-30 DIAGNOSIS — E559 Vitamin D deficiency, unspecified: Secondary | ICD-10-CM | POA: Diagnosis not present

## 2022-12-30 DIAGNOSIS — G629 Polyneuropathy, unspecified: Secondary | ICD-10-CM

## 2022-12-30 DIAGNOSIS — K219 Gastro-esophageal reflux disease without esophagitis: Secondary | ICD-10-CM

## 2022-12-30 DIAGNOSIS — Z79891 Long term (current) use of opiate analgesic: Secondary | ICD-10-CM | POA: Diagnosis not present

## 2022-12-30 DIAGNOSIS — J4520 Mild intermittent asthma, uncomplicated: Secondary | ICD-10-CM | POA: Diagnosis not present

## 2022-12-30 DIAGNOSIS — M810 Age-related osteoporosis without current pathological fracture: Secondary | ICD-10-CM

## 2022-12-30 DIAGNOSIS — R6889 Other general symptoms and signs: Secondary | ICD-10-CM | POA: Diagnosis not present

## 2022-12-30 MED ORDER — PREGABALIN 75 MG PO CAPS: 75.0000 mg | ORAL_CAPSULE | Freq: Two times a day (BID) | ORAL | 0 refills | Status: DC

## 2022-12-30 MED ORDER — SULFAMETHOXAZOLE-TRIMETHOPRIM 800-160 MG PO TABS: 1.0000 | ORAL_TABLET | Freq: Two times a day (BID) | ORAL | 0 refills | Status: AC

## 2022-12-30 NOTE — Patient Instructions (Signed)
Magnesium tech

## 2022-12-30 NOTE — Progress Notes (Signed)
Subjective:  Patient ID: Kristin Wallace, female    DOB: 01-13-1956, 67 y.o.   MRN: [id number]  Patient Care Team: Baruch Gouty, FNP as PCP - General (Family Medicine)   Chief Complaint:  Establish Care ([facility] patient ) and Medical Management of Chronic Issues   HPI: Kristin Wallace is a 67 y.o. female presenting on 12/30/2022 for Establish Care ([place] patient ) and Medical Management of Chronic Issues   1. 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 She reports continue paresthesias with pins, needles, and numbness which seems to be worsening.   2. Vitamin D deficiency Pt is taking oral repletion therapy. Denies bone pain and tenderness, muscle weakness, fracture, and difficulty walking. Lab Results  Component Value Date   VD25OH 60.4 06/23/2022   VD25OH 70 12/23/2021   Lab Results  Component Value Date   CALCIUM 9.8 09/29/2022      3. Mild intermittent asthma without complication Reports great control with current regimen. No new or worsening symptoms.   4. Age-related osteoporosis without current pathological fracture DEXA was completed by rheumatology and she is supposed to start new medications soon. Waiting on order from rheumatologist.   5. Gastroesophageal reflux disease without esophagitis Compliant with medications - Yes Current medications - protonix Adverse side effects - No DEXA if on PPI - DEXA scan is up to date. Cough - No Sore throat - No Voice change - No Hemoptysis - No Dysphagia or dyspepsia - No Water brash - No Red Flags (weight loss, hematochezia, melena, weight loss, early satiety, fevers, odynophagia, or persistent vomiting) - No   Relevant past medical, surgical, family, and social history reviewed and updated as indicated.  Allergies and medications reviewed and updated. Data reviewed: Chart in Epic.   Past Medical  History:  Diagnosis Date   Arthritis    erosive osteoarthritis   Asthma    Atrial fibrillation (HCC)    Chronically dry eyes    Fatigue    chronic ( and weakness)   GERD (gastroesophageal reflux disease)    Lyme disease    Neuromuscular disorder (HCC)    neuropathy   Neuropathy    legs and feet   Osteoporosis     Past Surgical History:  Procedure Laterality Date   ABDOMINAL HYSTERECTOMY     BREAST SURGERY Left    neg tumor   COLONOSCOPY     COLONOSCOPY WITH PROPOFOL N/A 07/07/2022   Procedure: COLONOSCOPY WITH PROPOFOL;  Surgeon: Eloise Harman, DO;  Location: [facility];  Service: Endoscopy;  Laterality: N/A;  1:15pm, ASA 2, per office pt can't move up   ESOPHAGOGASTRODUODENOSCOPY     POLYPECTOMY  07/07/2022   Procedure: POLYPECTOMY;  Surgeon: Eloise Harman, DO;  Location: [facility];  Service: Endoscopy;;   RIGHT AND LEFT HEART CATH     TUBAL LIGATION      Social History   Socioeconomic History   Marital status: Divorced    Spouse name: Not on file   Number of children: 3   Years of education: Not on file   Highest education level: Not on file  Occupational History   Occupation: retired  Tobacco Use   Smoking status: Never    Passive exposure: Never   Smokeless tobacco: Never  Vaping Use   Vaping Use: Never used  Substance and Sexual Activity  Alcohol use: Yes    Comment: 3 yearly   Drug use: Never   Sexual activity: Not on file  Other Topics Concern   Not on file  Social History Narrative   Moved here from [place] this year 07/27/2021.   Daughter lives with her   Right handed   Social Determinants of Health   Financial Resource Strain: Low Risk  (11/26/2022)   Overall Financial Resource Strain (CARDIA)    Difficulty of Paying Living Expenses: Not hard at all  Food Insecurity: No Food Insecurity (11/26/2022)   Hunger Vital Sign    Worried About Running Out of Food in the Last Year: Never true    Ran Out of Food in the Last Year: Never  true  Transportation Needs: No Transportation Needs (11/26/2022)   PRAPARE - Hydrologist (Medical): No    Lack of Transportation (Non-Medical): No  Physical Activity: Sufficiently Active (11/26/2022)   Exercise Vital Sign    Days of Exercise per Week: 5 days    Minutes of Exercise per Session: 30 min  Stress: No Stress Concern Present (11/26/2022)   [place]    Feeling of Stress : Not at all  Social Connections: Moderately Integrated (11/26/2022)   Social Connection and Isolation Panel [NHANES]    Frequency of Communication with Friends and Family: More than three times a week    Frequency of Social Gatherings with Friends and Family: More than three times a week    Attends Religious Services: More than 4 times per year    Active Member of [employer] or Organizations: Yes    Attends Archivist Meetings: More than 4 times per year    Marital Status: Divorced  Intimate Partner Violence: Not At Risk (11/26/2022)   Humiliation, Afraid, Rape, and Kick questionnaire    Fear of Current or Ex-Partner: No    Emotionally Abused: No    Physically Abused: No    Sexually Abused: No    Outpatient Encounter Medications as of 12/30/2022  Medication Sig   albuterol (VENTOLIN HFA) 108 (90 Base) MCG/ACT inhaler Inhale into the lungs.   Ascorbic Acid (VITAMIN C) 1000 MG tablet Take 1,000 mg by mouth in the morning.   budesonide-formoterol (SYMBICORT) 160-4.5 MCG/ACT inhaler Inhale 2 puffs into the lungs 2 (two) times daily.   Calcium Carb-Cholecalciferol (CALCIUM 600+D3 PO) Take 1 tablet by mouth every morning.   carboxymethylcellul-glycerin (OPTIVE) 0.5-0.9 % ophthalmic solution Place 1 drop into both eyes 2 (two) times daily as needed for dry eyes.   Cholecalciferol (VITAMIN D3) 125 MCG (5000 UT) CAPS Take 5,000 Units by mouth in the morning.   CINNAMON PO Take 1 Dose by mouth See admin instructions. 1  teaspoonful twice daily with 2 teaspoonful of honey   Cyanocobalamin (B-12) 2500 MCG TABS Take 2,500 mcg by mouth in the morning and at bedtime.   cycloSPORINE (RESTASIS) 0.05 % ophthalmic emulsion Place 2 drops into both eyes 2 (two) times daily.   montelukast (SINGULAIR) 10 MG tablet Take 1 tablet (10 mg total) by mouth at bedtime. (NEEDS TO BE SEEN BEFORE NEXT REFILL)   Multiple Vitamins-Minerals (DRY EYE FORMULA) CAPS Take 3 capsules by mouth in the morning.   Multiple Vitamins-Minerals (MULTIVITAMIN WOMEN 50+) TABS Take 1 tablet by mouth in the morning.   pantoprazole (PROTONIX) 40 MG tablet Take 1 tablet (40 mg total) by mouth daily.   pregabalin (LYRICA) 75 MG capsule Take  1 capsule (75 mg total) by mouth 2 (two) times daily.   PSYLLIUM HUSK PO Take 1 Dose by mouth in the morning.   raloxifene (EVISTA) 60 MG tablet Take 1 tablet (60 mg total) by mouth daily.   SODIUM FLUORIDE 5000 PPM 1.1 % PSTE Take by mouth.   sulfamethoxazole-trimethoprim (BACTRIM DS) 800-160 MG tablet Take 1 tablet by mouth 2 (two) times daily for 7 days.   TURMERIC CURCUMIN PO Take 1 capsule by mouth in the morning and at bedtime. W/Ginger Powder   [DISCONTINUED] pregabalin (LYRICA) 150 MG capsule Take 1 capsule (150 mg total) by mouth 2 (two) times daily. (Patient taking differently: Take 75 mg by mouth 2 (two) times daily.)   [DISCONTINUED] budesonide-formoterol (SYMBICORT) 160-4.5 MCG/ACT inhaler Inhale 1 puff into the lungs 2 (two) times daily.   No facility-administered encounter medications on file as of 12/30/2022.    Allergies  Allergen Reactions   Other Shortness Of Breath   Shellfish Allergy Other (See Comments) and Nausea And Vomiting    Vomiting    Review of Systems  Constitutional:  Negative for activity change, appetite change, chills, diaphoresis, fatigue, fever and unexpected weight change.  HENT: Negative.    Eyes: Negative.  Negative for photophobia and visual disturbance.  Respiratory:   Negative for cough, chest tightness and shortness of breath.   Cardiovascular:  Negative for chest pain, palpitations and leg swelling.  Gastrointestinal:  Negative for abdominal pain, blood in stool, constipation, diarrhea, nausea and vomiting.  Endocrine: Negative.  Negative for polydipsia, polyphagia and polyuria.  Genitourinary:  Negative for decreased urine volume, difficulty urinating, dysuria, frequency and urgency.  Musculoskeletal:  Positive for myalgias. Negative for arthralgias.  Skin:  Positive for color change.       Left great toe erythema and tenderness from recent procedure  Allergic/Immunologic: Negative.   Neurological:  Positive for numbness. Negative for dizziness, tremors, seizures, syncope, facial asymmetry, speech difficulty, weakness, light-headedness and headaches.  Hematological: Negative.   Psychiatric/Behavioral:  Negative for confusion, hallucinations, sleep disturbance and suicidal ideas.   All other systems reviewed and are negative.       Objective:  BP 100/60   Pulse 76   Temp 98.1 F (36.7 C) (Temporal)   Ht 5' 5.75" (1.67 m)   Wt 129 lb 12.8 oz (58.9 kg)   SpO2 100%   BMI 21.11 kg/m    Wt Readings from Last 3 Encounters:  12/30/22 129 lb 12.8 oz (58.9 kg)  12/04/22 125 lb (56.7 kg)  11/26/22 125 lb (56.7 kg)    Physical Exam Vitals and nursing note reviewed.  Constitutional:      General: She is not in acute distress.    Appearance: Normal appearance. She is well-developed and well-groomed. She is not ill-appearing, toxic-appearing or diaphoretic.     Comments: Frail elderly  HENT:     Head: Normocephalic and atraumatic.     Jaw: There is normal jaw occlusion.     Right Ear: Hearing normal.     Left Ear: Hearing normal.     Nose: Nose normal.     Mouth/Throat:     Lips: Pink.     Mouth: Mucous membranes are moist.     Pharynx: Oropharynx is clear. Uvula midline.  Eyes:     General: Lids are normal.     Extraocular Movements:  Extraocular movements intact.     Conjunctiva/sclera: Conjunctivae normal.     Pupils: Pupils are equal, round, and reactive to light.  Neck:     Thyroid: No thyroid mass, thyromegaly or thyroid tenderness.     Vascular: No carotid bruit or JVD.     Trachea: Trachea and phonation normal.  Cardiovascular:     Rate and Rhythm: Normal rate and regular rhythm.     Chest Wall: PMI is not displaced.     Pulses: Normal pulses.     Heart sounds: Normal heart sounds. No murmur heard.    No friction rub. No gallop.  Pulmonary:     Effort: Pulmonary effort is normal. No respiratory distress.     Breath sounds: Normal breath sounds. No wheezing.  Abdominal:     General: Bowel sounds are normal. There is no distension or abdominal bruit.     Palpations: Abdomen is soft. There is no hepatomegaly or splenomegaly.     Tenderness: There is no abdominal tenderness. There is no right CVA tenderness or left CVA tenderness.     Hernia: No hernia is present.  Musculoskeletal:        General: Normal range of motion.     Cervical back: Normal range of motion and neck supple.     Right lower leg: No edema.     Left lower leg: No edema.       Feet:  Lymphadenopathy:     Cervical: No cervical adenopathy.  Skin:    General: Skin is warm and dry.     Capillary Refill: Capillary refill takes less than 2 seconds.     Coloration: Skin is not cyanotic, jaundiced or pale.     Findings: Erythema and wound present. No rash.  Neurological:     General: No focal deficit present.     Mental Status: She is alert and oriented to person, place, and time.     Sensory: Sensation is intact.     Motor: Motor function is intact.     Coordination: Coordination is intact.     Gait: Gait is intact.     Deep Tendon Reflexes: Reflexes are normal and symmetric.  Psychiatric:        Attention and Perception: Attention and perception normal.        Mood and Affect: Mood and affect normal.        Speech: Speech normal.         Behavior: Behavior normal. Behavior is cooperative.        Thought Content: Thought content normal.        Cognition and Memory: Cognition and memory normal.        Judgment: Judgment normal.     Results for orders placed or performed in visit on 11/13/22  Vitamin B12  Result Value Ref Range   Vitamin B-12 >2000 (H) 232 - 1245 pg/mL       Pertinent labs & imaging results that were available during my care of the patient were reviewed by me and considered in my medical decision making.  Assessment & Plan:  Deissy was seen today for establish care and annual exam.  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 Will decrease back to 75 mg twice daily dosing. Will check labs for potential underlying causes. Controlled substance agreement updated along with UDS. PDMP reviewed without red flags.  -     ToxASSURE Select 13 (MW), Urine -     pregabalin (LYRICA) 75 MG capsule; Take 1 capsule (75 mg  total) by mouth 2 (two) times daily. -     Anemia Profile B -     CMP14+EGFR -     Thyroid Panel With TSH -     VITAMIN D 25 Hydroxy (Vit-D Deficiency, Fractures) -     Magnesium  Vitamin D deficiency Labs pending. Continue repletion therapy. If indicated, will change repletion dosage. Eat foods rich in Vit D including milk, orange juice, yogurt with vitamin D added, salmon or mackerel, canned tuna fish, cereals with vitamin D added, and cod liver oil. Get out in the sun but make sure to wear at least SPF 30 sunscreen.  -     Anemia Profile B -     VITAMIN D 25 Hydroxy (Vit-D Deficiency, Fractures)  Mild intermittent asthma without complication Well controlled on current regimen, will continue.   Age-related osteoporosis without current pathological fracture DEXA by rheumatology, currently on Vit D, calcium and Evista.  -     CMP14+EGFR -     VITAMIN D 25 Hydroxy (Vit-D Deficiency,  Fractures)  Gastroesophageal reflux disease without esophagitis No red flags present. Diet discussed. Avoid fried, spicy, fatty, greasy, and acidic foods. Avoid caffeine, nicotine, and alcohol. Do not eat 2-3 hours before bedtime and stay upright for at least 1-2 hours after eating. Eat small frequent meals. Avoid NSAID's like motrin and aleve. Medications as prescribed. Report any new or worsening symptoms. Follow up as discussed or sooner if needed.   -     Anemia Profile B  Cellulitis of great toe of right foot Had ingrown toenail removed and now has swelling and erythema with tenderness. Will treat cellulitis with below. Has follow for reevaluation scheduled. -     sulfamethoxazole-trimethoprim (BACTRIM DS) 800-160 MG tablet; Take 1 tablet by mouth 2 (two) times daily for 7 days.     Continue all other maintenance medications.  Follow up plan: Return in about 3 months (around 03/30/2023) for CPE.   Continue healthy lifestyle choices, including diet (rich in fruits, vegetables, and lean proteins, and low in salt and simple carbohydrates) and exercise (at least 30 minutes of moderate physical activity daily).  Educational handout given for neuropathic pain  The above assessment and management plan was discussed with the patient. The patient verbalized understanding of and has agreed to the management plan. Patient is aware to call the clinic if they develop any new symptoms or if symptoms persist or worsen. Patient is aware when to return to the clinic for a follow-up visit. Patient educated on when it is appropriate to go to the emergency department.   Monia Pouch, FNP-C [facility] [phone]

## 2022-12-31 DIAGNOSIS — L03031 Cellulitis of right toe: Secondary | ICD-10-CM | POA: Diagnosis not present

## 2022-12-31 DIAGNOSIS — M79671 Pain in right foot: Secondary | ICD-10-CM | POA: Diagnosis not present

## 2022-12-31 DIAGNOSIS — L600 Ingrowing nail: Secondary | ICD-10-CM | POA: Diagnosis not present

## 2022-12-31 DIAGNOSIS — M79674 Pain in right toe(s): Secondary | ICD-10-CM | POA: Diagnosis not present

## 2023-01-01 LAB — ANEMIA PROFILE B
Basophils Absolute: 0 10*3/uL (ref 0.0–0.2)
Basos: 1 %
EOS (ABSOLUTE): 0.1 10*3/uL (ref 0.0–0.4)
Eos: 2 %
Ferritin: 50 ng/mL (ref 15–150)
Folate: >20 ng/mL (ref >3.0)
Hematocrit: 37.4 % (ref 34.0–46.6)
Hemoglobin: 12.1 g/dL (ref 11.1–15.9)
Immature Grans (Abs): 0 10*3/uL (ref 0.0–0.1)
Immature Granulocytes: 0 %
Iron Saturation: 25 % (ref 15–55)
Iron: 85 ug/dL (ref 27–139)
Lymphocytes Absolute: 1.3 10*3/uL (ref 0.7–3.1)
Lymphs: 25 %
MCH: 29.4 pg (ref 26.6–33.0)
MCHC: 32.4 g/dL (ref 31.5–35.7)
MCV: 91 fL (ref 79–97)
Monocytes Absolute: 0.4 10*3/uL (ref 0.1–0.9)
Monocytes: 8 %
Neutrophils Absolute: 3.4 10*3/uL (ref 1.4–7.0)
Neutrophils: 64 %
Platelets: 291 10*3/uL (ref 150–450)
RBC: 4.11 x10E6/uL (ref 3.77–5.28)
RDW: 13.2 % (ref 11.7–15.4)
Retic Ct Pct: 1.2 % (ref 0.6–2.6)
Total Iron Binding Capacity: 334 ug/dL (ref 250–450)
UIBC: 249 ug/dL (ref 118–369)
Vitamin B-12: >2000 pg/mL — ABNORMAL HIGH (ref 232–1245)
WBC: 5.3 10*3/uL (ref 3.4–10.8)

## 2023-01-01 LAB — CMP14+EGFR
ALT: 21 IU/L (ref 0–32)
AST: 26 IU/L (ref 0–40)
Albumin/Globulin Ratio: 1.7 (ref 1.2–2.2)
Albumin: 4.2 g/dL (ref 3.9–4.9)
Alkaline Phosphatase: 59 IU/L (ref 44–121)
BUN/Creatinine Ratio: 16 (ref 12–28)
BUN: 9 mg/dL (ref 8–27)
Bilirubin Total: 0.2 mg/dL (ref 0.0–1.2)
CO2: 24 mmol/L (ref 20–29)
Calcium: 9.7 mg/dL (ref 8.7–10.3)
Chloride: 103 mmol/L (ref 96–106)
Creatinine, Ser: 0.55 mg/dL — ABNORMAL LOW (ref 0.57–1.00)
Globulin, Total: 2.5 g/dL (ref 1.5–4.5)
Glucose: 89 mg/dL (ref 70–99)
Potassium: 4 mmol/L (ref 3.5–5.2)
Sodium: 141 mmol/L (ref 134–144)
Total Protein: 6.7 g/dL (ref 6.0–8.5)
eGFR: 100 mL/min/{1.73_m2} (ref >59)

## 2023-01-01 LAB — MAGNESIUM: Magnesium: 2.2 mg/dL (ref 1.6–2.3)

## 2023-01-01 LAB — THYROID PANEL WITH TSH
Free Thyroxine Index: 2 (ref 1.2–4.9)
T3 Uptake Ratio: 25 % (ref 24–39)
T4, Total: 7.8 ug/dL (ref 4.5–12.0)
TSH: 1.41 u[IU]/mL (ref 0.450–4.500)

## 2023-01-01 LAB — VITAMIN D 25 HYDROXY (VIT D DEFICIENCY, FRACTURES): Vit D, 25-Hydroxy: 61 ng/mL (ref 30.0–100.0)

## 2023-01-03 LAB — TOXASSURE SELECT 13 (MW), URINE

## 2023-01-06 ENCOUNTER — Telehealth: Payer: Self-pay | Admitting: Internal Medicine

## 2023-01-06 ENCOUNTER — Other Ambulatory Visit: Payer: Self-pay | Admitting: Pharmacist

## 2023-01-06 DIAGNOSIS — M810 Age-related osteoporosis without current pathological fracture: Secondary | ICD-10-CM

## 2023-01-06 NOTE — Telephone Encounter (Signed)
Patient called the office stating she has a virtual appointment last month with Dr. Benjamine Mola regarding starting Reclast infusions. Patient states she was told [facility] would reach out to schedule the infusion. Patient states they have not reached out and would like to know when she should be expecting a call or if there is a delay. Please advise.

## 2023-01-06 NOTE — Telephone Encounter (Signed)
Sorry - we did not receive notification of new start Reclast. Will place referral to [place], PharmD, MPH, BCPS, CPP Clinical Pharmacist (Rheumatology and Pulmonology)

## 2023-01-06 NOTE — Progress Notes (Signed)
Patient is new start to RECLAST Diagnosis: age-related osteoporosis  Dose: '5mg'$  IV every 12 months  Last Clinic Visit: 12/22/2022 Next Clinic Visit: 03/22/2023  Labs: CBC, CMP, Vitamin D on 12/30/22 Last DEXA: 12/24/2021 Next DEXA due: February 2025  Orders placed for Reclast x 1 dose with premedication of acetaminophen and diphenhydramine to be administered 30 minutes before medication infusion.  Knox Saliva, PharmD, MPH, BCPS, CPP Clinical Pharmacist (Rheumatology and Pulmonology)

## 2023-01-07 ENCOUNTER — Other Ambulatory Visit: Payer: Self-pay

## 2023-01-11 ENCOUNTER — Telehealth: Payer: Self-pay | Admitting: Pharmacy Technician

## 2023-01-11 ENCOUNTER — Encounter (HOSPITAL_COMMUNITY): Payer: Self-pay | Admitting: Internal Medicine

## 2023-01-11 NOTE — Telephone Encounter (Signed)
Auth Submission: NO AUTH NEEDED @ [facility] Payer: [employer] DUAL Medication & CPT/J Code(s) submitted: Reclast (Zolendronic acid) [id number] Route of submission (phone, fax, portal):  Phone # Fax # Auth type: Buy/Bill Units/visits requested: 1 Reference number:  Approval from: 01/11/23 to 11/02/23

## 2023-01-12 ENCOUNTER — Encounter (HOSPITAL_COMMUNITY)
Admission: RE | Admit: 2023-01-12 | Discharge: 2023-01-12 | Disposition: A | Discharge status: Home / Self Care | Payer: 59 | Source: Ambulatory Visit | Attending: Internal Medicine | Admitting: Internal Medicine

## 2023-01-12 VITALS — BP 129/68 | HR 71 | Temp 98.2°F | Resp 16

## 2023-01-12 DIAGNOSIS — M810 Age-related osteoporosis without current pathological fracture: Secondary | ICD-10-CM | POA: Diagnosis not present

## 2023-01-12 MED ORDER — ACETAMINOPHEN 325 MG PO TABS
650.0000 mg | ORAL_TABLET | Freq: Once | ORAL | Status: AC
  Administered 2023-01-12: 650 mg via ORAL
  Filled 2023-01-12: qty 2

## 2023-01-12 MED ORDER — ZOLEDRONIC ACID 5 MG/100ML IV SOLN
5.0000 mg | Freq: Once | INTRAVENOUS | Status: AC
  Administered 2023-01-12: 5 mg via INTRAVENOUS
  Filled 2023-01-12: qty 100

## 2023-01-12 MED ORDER — DIPHENHYDRAMINE HCL 25 MG PO CAPS
25.0000 mg | ORAL_CAPSULE | Freq: Once | ORAL | Status: AC
  Administered 2023-01-12: 25 mg via ORAL
  Filled 2023-01-12: qty 1

## 2023-01-12 NOTE — Progress Notes (Addendum)
Diagnosis: Osteoporosis  Provider:   Vernelle Emerald MD  Procedure: Infusion  IV Type: Peripheral, IV Location: L Antecubital  Reclast (Zolendronic Acid), Dose: 5 mg  Infusion Start Time: [id number]  Infusion Stop Time: [id number]  Post Infusion IV Care: Observation period completed and Peripheral IV Discontinued  Patient stated she felt slightly woozy when she stood up after the observation period ended. Patient drank water and orange juice, and ate applesauce. Observed patient an additional 25 minutes. Repeat blood pressure WDL. Patient stated she felt better and able to drive safely.  Discharge: Condition: Good, Destination: Home . AVS Provided  Performed by:  Binnie Kand, RN

## 2023-01-12 NOTE — Progress Notes (Signed)
Reclast scheduled for 01/12/23. Will f//u to ensure completed  Knox Saliva, PharmD, MPH, BCPS, CPP Clinical Pharmacist (Rheumatology and Pulmonology)

## 2023-01-15 ENCOUNTER — Other Ambulatory Visit: Payer: Self-pay | Admitting: Family Medicine

## 2023-01-15 DIAGNOSIS — K2100 Gastro-esophageal reflux disease with esophagitis, without bleeding: Secondary | ICD-10-CM

## 2023-01-15 DIAGNOSIS — J4520 Mild intermittent asthma, uncomplicated: Secondary | ICD-10-CM

## 2023-01-19 ENCOUNTER — Other Ambulatory Visit: Payer: Self-pay | Admitting: Gastroenterology

## 2023-01-19 DIAGNOSIS — J4520 Mild intermittent asthma, uncomplicated: Secondary | ICD-10-CM

## 2023-01-19 DIAGNOSIS — K2100 Gastro-esophageal reflux disease with esophagitis, without bleeding: Secondary | ICD-10-CM

## 2023-02-01 ENCOUNTER — Ambulatory Visit (INDEPENDENT_AMBULATORY_CARE_PROVIDER_SITE_OTHER): Payer: 59 | Admitting: Pulmonary Disease

## 2023-02-01 ENCOUNTER — Encounter: Payer: Self-pay | Admitting: Pulmonary Disease

## 2023-02-01 VITALS — BP 136/78 | HR 78 | Ht 67.5 in | Wt 126.8 lb

## 2023-02-01 DIAGNOSIS — R0683 Snoring: Secondary | ICD-10-CM | POA: Diagnosis not present

## 2023-02-01 DIAGNOSIS — J4540 Moderate persistent asthma, uncomplicated: Secondary | ICD-10-CM

## 2023-02-01 NOTE — Progress Notes (Signed)
[facility]  Chief Complaint  Patient presents with   Consult    Sleep consult      Past Surgical History:  She  has a past surgical history that includes Tubal ligation; Abdominal hysterectomy; RIGHT AND LEFT HEART CATH; Breast surgery (Left); Colonoscopy; Esophagogastroduodenoscopy; Colonoscopy with propofol (N/A, 07/07/2022); and polypectomy (07/07/2022).  Past Medical History:  OA, A fib, GERD, Lyme disease, Neuropathy, Osteoporosis, Vit D deficiency  Constitutional:  BP 136/78   Pulse 78   Ht 5' 7.5" (1.715 m)   Wt 126 lb 12.8 oz (57.5 kg)   SpO2 99% Comment: ra  BMI 19.57 kg/m   Brief Summary:  Kristin Wallace is a 67 y.o. female with asthma and snoring.      Subjective:   She was seen recently by cardiology.  Noted to have trouble with her sleep.  This has been going on for a while.  She snores and wakes up with a dry mouth.  She has trouble sleeping on her back.  She is always tired during the day.  She can fall asleep while watching TV.  She goes to sleep between 830 and 930 pm.  She falls asleep in 30 minutes.  She wakes up 2 to 4 times to use the bathroom.  She gets out of bed at 530 am.  She feels tired in the morning.  She denies morning headache.  She does not use anything to help her fall sleep.  She drinks 3 to 4 cups of coffee to help stay awake.  She denies sleep walking, sleep talking, bruxism, or nightmares.  There is no history of restless legs.  She denies sleep hallucinations, sleep paralysis, or cataplexy.  The Epworth score is 12 out of 24.   Physical Exam:   Appearance - well kempt   ENMT - no sinus tenderness, no oral exudate, no LAN, Mallampati 2 airway, no stridor  Respiratory - equal breath sounds bilaterally, no wheezing or rales  CV - s1s2 regular rate and rhythm, no murmurs  Ext - no clubbing, no edema  Skin - no rashes  Psych - normal mood and affect   Pulmonary testing:    Chest  imaging:  CT chest 12/18/20 >> apical scarring  Sleep testing:    Cardiac testing:  Echo 10/28/22 >> EF 50 to 55%, mild MR, mild LA dilation  Social History:  She  reports that she has never smoked. She has never been exposed to tobacco smoke. She has never used smokeless tobacco. She reports current alcohol use. She reports that she does not use drugs.  Family History:  Her family history includes Cancer in her brother and brother; Depression in her daughter and mother; Heart disease in her sister; Migraines in her daughter; Other in her father and mother; Thyroid disease in her daughter.     Assessment/Plan:   Moderate, persistent asthma. - previously seen by Dr. Rance Muir in [place], [place]; unfortunately haven't been able to get medical records from there - continue symbicort 160 1 puff bid and singulair 10 mg qhs - prn albuterol - she has a spacer device  Snoring. - associated with sleep disruption, apnea, and daytime sleepiness - concerned she could have obstructive sleep apnea - will arrange for a home sleep study to assess further assess - discussed how untreated sleep apnea can impact her health - driving precautions discussed  Palpitations, atypical chest pain. - followed by Dr. Jodelle Gross Assar with [facility] cardiology in  [place]  Erosive osteoarthritis. - followed by Dr. Vernelle Emerald with rheumatology  Time Spent Involved in Patient Care on Day of Examination:  37 minutes  Follow up:   Patient Instructions  Will arrange for a home sleep study Will call to arrange for follow up after sleep study reviewed   Medication List:   Allergies as of 02/01/2023       Reactions   Other Shortness Of Breath   Shellfish Allergy Other (See Comments), Nausea And Vomiting   Vomiting        Medication List        Accurate as of February 01, 2023  9:51 AM. If you have any questions, ask your nurse or doctor.          STOP taking these medications    raloxifene 60  MG tablet Commonly known as: EVISTA Stopped by: Chesley Mires, MD       TAKE these medications    albuterol 108 (90 Base) MCG/ACT inhaler Commonly known as: VENTOLIN HFA Inhale into the lungs.   B-12 2500 MCG Tabs Take 2,500 mcg by mouth in the morning and at bedtime.   budesonide-formoterol 160-4.5 MCG/ACT inhaler Commonly known as: SYMBICORT Inhale 2 puffs into the lungs 2 (two) times daily.   CALCIUM 600+D3 PO Take 1 tablet by mouth every morning.   CINNAMON PO Take 1 Dose by mouth See admin instructions. 1 teaspoonful twice daily with 2 teaspoonful of honey   cycloSPORINE 0.05 % ophthalmic emulsion Commonly known as: RESTASIS Place 2 drops into both eyes 2 (two) times daily.   montelukast 10 MG tablet Commonly known as: SINGULAIR TAKE 1 TABLET BY MOUTH AT  BEDTIME   Multivitamin Women 50+ Tabs Take 1 tablet by mouth in the morning.   Dry Eye Formula Caps Take 3 capsules by mouth in the morning.   OPTIVE 0.5-0.9 % ophthalmic solution Generic drug: carboxymethylcellul-glycerin Place 1 drop into both eyes 2 (two) times daily as needed for dry eyes.   pantoprazole 40 MG tablet Commonly known as: PROTONIX TAKE 1 TABLET BY MOUTH DAILY   pregabalin 75 MG capsule Commonly known as: Lyrica Take 1 capsule (75 mg total) by mouth 2 (two) times daily.   PSYLLIUM HUSK PO Take 1 Dose by mouth in the morning.   Sodium Fluoride 5000 PPM 1.1 % Pste Generic drug: Sodium Fluoride Take by mouth.   TURMERIC CURCUMIN PO Take 1 capsule by mouth in the morning and at bedtime. W/Ginger Powder   vitamin C 1000 MG tablet Take 1,000 mg by mouth in the morning.   Vitamin D3 125 MCG (5000 UT) capsule Generic drug: Cholecalciferol Take 5,000 Units by mouth in the morning.        Signature:  Chesley Mires, MD [place] Pager - [phone] 02/01/2023, 9:51 AM

## 2023-02-01 NOTE — Patient Instructions (Signed)
Will arrange for a home sleep study Will call to arrange for follow up after sleep study reviewed  

## 2023-02-05 ENCOUNTER — Telehealth: Payer: Self-pay | Admitting: Family Medicine

## 2023-02-05 NOTE — Telephone Encounter (Signed)
Pt wants to discuss pregabalin (LYRICA) 75 MG capsule dosage and if it can be called in earlier. When pt seen [facility]MM in Feb, pt says that the dosage was increased to 150MG  in FEB with no refills. PT is asking for a new rx to be sent to [facility] for 150MG . The current refill that she has on file is for 75MG  and cannot fill till 02/21/2023. Please call back

## 2023-02-05 NOTE — Telephone Encounter (Signed)
Patient aware and verbalizes understanding. 

## 2023-02-10 LAB — HM MAMMOGRAPHY

## 2023-02-17 ENCOUNTER — Encounter: Payer: Self-pay | Admitting: Family Medicine

## 2023-02-22 DIAGNOSIS — G4730 Sleep apnea, unspecified: Secondary | ICD-10-CM | POA: Insufficient documentation

## 2023-02-26 ENCOUNTER — Encounter (INDEPENDENT_AMBULATORY_CARE_PROVIDER_SITE_OTHER): Payer: 59

## 2023-02-26 ENCOUNTER — Telehealth (HOSPITAL_BASED_OUTPATIENT_CLINIC_OR_DEPARTMENT_OTHER): Payer: Self-pay | Admitting: Pulmonary Disease

## 2023-02-26 DIAGNOSIS — G4733 Obstructive sleep apnea (adult) (pediatric): Secondary | ICD-10-CM

## 2023-02-26 DIAGNOSIS — R0683 Snoring: Secondary | ICD-10-CM

## 2023-02-26 NOTE — Telephone Encounter (Signed)
HST 02/16/23 >> AHI 17.4, SpO2 low 87%   Please inform her that her sleep study shows moderate obstructive sleep apnea.  Please arrange for ROV with me or NP to discuss treatment options.

## 2023-03-05 ENCOUNTER — Encounter: Payer: Self-pay | Admitting: Pulmonary Disease

## 2023-03-05 ENCOUNTER — Ambulatory Visit (INDEPENDENT_AMBULATORY_CARE_PROVIDER_SITE_OTHER): Payer: 59 | Admitting: Pulmonary Disease

## 2023-03-05 VITALS — BP 96/55 | HR 68 | Ht 65.0 in | Wt 125.0 lb

## 2023-03-05 DIAGNOSIS — J4540 Moderate persistent asthma, uncomplicated: Secondary | ICD-10-CM | POA: Diagnosis not present

## 2023-03-05 DIAGNOSIS — G4733 Obstructive sleep apnea (adult) (pediatric): Secondary | ICD-10-CM

## 2023-03-05 NOTE — Progress Notes (Signed)
[facility]  Chief Complaint  Patient presents with   Follow-up    Pt f/u to discuss HST - asthma is controlled    Past Surgical History:  She  has a past surgical history that includes Tubal ligation; Abdominal hysterectomy; RIGHT AND LEFT HEART CATH; Breast surgery (Left); Colonoscopy; Esophagogastroduodenoscopy; Colonoscopy with propofol (N/A, 07/07/2022); and polypectomy (07/07/2022).  Past Medical History:  OA, A fib, GERD, Lyme disease, Neuropathy, Osteoporosis, Vit D deficiency  Constitutional:  BP (!) 96/55   Pulse 68   Ht 5\' 5"  (1.651 m)   Wt 125 lb (56.7 kg)   SpO2 98%   BMI 20.80 kg/m   Brief Summary:  Kristin Wallace is a 67 y.o. female with asthma and obstructive sleep apnea.      Subjective:   Home sleep study showed moderate obstructive sleep apnea.  Breathing has been okay.  Not having cough, wheeze, or sputum.  She gets dizzy intermittently.  BP was low this morning.  My recheck showed BP 104/56.  Denies chest pain, nausea, or palpitations.  Physical Exam:   Appearance - well kempt   ENMT - no sinus tenderness, no oral exudate, no LAN, Mallampati 2 airway, no stridor  Respiratory - equal breath sounds bilaterally, no wheezing or rales  CV - s1s2 regular rate and rhythm, no murmurs  Ext - no clubbing, no edema  Skin - no rashes  Psych - normal mood and affect    Pulmonary testing:    Chest imaging:  CT chest 12/18/20 >> apical scarring  Sleep testing:  HST 02/16/23 >> AHI 17.4, SpO2 low 87%   Cardiac testing:  Echo 10/28/22 >> EF 50 to 55%, mild MR, mild LA dilation  Social History:  She  reports that she has never smoked. She has never been exposed to tobacco smoke. She has never used smokeless tobacco. She reports current alcohol use. She reports that she does not use drugs.  Family History:  Her family history includes Cancer in her brother and brother; Depression in her daughter and mother;  Heart disease in her sister; Migraines in her daughter; Other in her father and mother; Thyroid disease in her daughter.     Assessment/Plan:   Obstructive sleep apnea. - reviewed her sleep study - discussed how sleep apnea can impact her heath - treatment options reviewed - will arrange for auto CPAP set up  Moderate, persistent asthma. - previously seen by Dr. Herbert Seta in [place], [place]; unfortunately haven't been able to get medical records from there - continue symbicort 160 one puff bid and singulair 10 mg qhs - prn albuterol - she has a spacer device  Palpitations, atypical chest pain. - followed by Dr. Rayetta Pigg Assar with [facility] cardiology in [place]  Erosive osteoarthritis. - followed by Dr. Sheliah Hatch with rheumatology  Time Spent Involved in Patient Care on Day of Examination:  36 minutes  Follow up:   There are no Patient Instructions on file for this visit.  Medication List:   Allergies as of 03/05/2023       Reactions   Other Shortness Of Breath   Shellfish Allergy Other (See Comments), Nausea And Vomiting   Vomiting        Medication List        Accurate as of Mar 05, 2023  9:33 AM. If you have any questions, ask your nurse or doctor.          STOP taking these medications  B-12 2500 MCG Tabs Stopped by: Coralyn Helling, MD       TAKE these medications    albuterol 108 (90 Base) MCG/ACT inhaler Commonly known as: VENTOLIN HFA Inhale into the lungs.   budesonide-formoterol 160-4.5 MCG/ACT inhaler Commonly known as: SYMBICORT Inhale 2 puffs into the lungs 2 (two) times daily.   CALCIUM 600+D3 PO Take 1 tablet by mouth every morning.   CINNAMON PO Take 1 Dose by mouth See admin instructions. 1 teaspoonful twice daily with 2 teaspoonful of honey   cycloSPORINE 0.05 % ophthalmic emulsion Commonly known as: RESTASIS Place 2 drops into both eyes 2 (two) times daily.   montelukast 10 MG tablet Commonly known as: SINGULAIR TAKE 1  TABLET BY MOUTH AT  BEDTIME   Multivitamin Women 50+ Tabs Take 1 tablet by mouth in the morning.   Dry Eye Formula Caps Take 3 capsules by mouth in the morning.   OPTIVE 0.5-0.9 % ophthalmic solution Generic drug: carboxymethylcellul-glycerin Place 1 drop into both eyes 2 (two) times daily as needed for dry eyes.   pantoprazole 40 MG tablet Commonly known as: PROTONIX TAKE 1 TABLET BY MOUTH DAILY   pregabalin 75 MG capsule Commonly known as: Lyrica Take 1 capsule (75 mg total) by mouth 2 (two) times daily.   PSYLLIUM HUSK PO Take 1 Dose by mouth in the morning.   Sodium Fluoride 5000 PPM 1.1 % Pste Generic drug: Sodium Fluoride Take by mouth.   TURMERIC CURCUMIN PO Take 1 capsule by mouth in the morning and at bedtime. W/Ginger Powder   vitamin C 1000 MG tablet Take 1,000 mg by mouth in the morning.   Vitamin D3 125 MCG (5000 UT) Caps Take 5,000 Units by mouth in the morning.        Signature:  Coralyn Helling, MD [facility] Pager - [phone] 03/05/2023, 9:33 AM

## 2023-03-05 NOTE — Patient Instructions (Signed)
Will arrange for CPAP set up ? ?Follow up in 4 months ?

## 2023-03-05 NOTE — Telephone Encounter (Signed)
Discussed w/ pt @ [facility] today 03/05/2023

## 2023-03-18 ENCOUNTER — Ambulatory Visit (INDEPENDENT_AMBULATORY_CARE_PROVIDER_SITE_OTHER): Payer: 59 | Admitting: Family Medicine

## 2023-03-18 ENCOUNTER — Encounter: Payer: Self-pay | Admitting: Family Medicine

## 2023-03-18 VITALS — BP 108/62 | HR 67 | Temp 97.9°F | Ht 65.0 in | Wt 124.2 lb

## 2023-03-18 DIAGNOSIS — J4541 Moderate persistent asthma with (acute) exacerbation: Secondary | ICD-10-CM | POA: Diagnosis not present

## 2023-03-18 MED ORDER — PREDNISONE 20 MG PO TABS: 20.0000 mg | ORAL_TABLET | Freq: Every day | ORAL | 0 refills | Status: AC

## 2023-03-18 MED ORDER — METHYLPREDNISOLONE ACETATE 40 MG/ML IJ SUSP
40.0000 mg | Freq: Once | INTRAMUSCULAR | Status: AC
  Administered 2023-03-18: 40 mg via INTRAMUSCULAR

## 2023-03-18 NOTE — Progress Notes (Signed)
Acute Office Visit  Subjective:     Patient ID: Kristin Wallace, female    DOB: [birth year], 67 y.o.   MRN: [id number]  Chief Complaint  Patient presents with   Cough    Cough This is a new problem. Episode onset: 2 weeks. The problem has been gradually improving. The problem occurs hourly. The cough is Productive of sputum. Associated symptoms include chest pain, shortness of breath (intermittent) and wheezing. Nothing aggravates the symptoms. She has tried a beta-agonist inhaler and steroid inhaler for the symptoms. The treatment provided mild relief. Her past medical history is significant for asthma.   Initially had fever, sneezing, congestion, sore throat for a few days that has now resolved. She has had to use her albuterol inhaler 1-2x a day when typically she never uses her albuterol inhaler.    ROS As per HPI.      Objective:    BP 108/62   Pulse 67   Temp 97.9 F (36.6 C) (Temporal)   Ht 5\' 5"  (1.651 m)   Wt 124 lb 4 oz (56.4 kg)   SpO2 100%   BMI 20.68 kg/m    Physical Exam Vitals and nursing note reviewed.  Constitutional:      General: She is not in acute distress.    Appearance: Normal appearance. She is not ill-appearing, toxic-appearing or diaphoretic.  HENT:     Head: Normocephalic and atraumatic.     Right Ear: A middle ear effusion is present. Tympanic membrane is not erythematous, retracted or bulging.     Left Ear: A middle ear effusion is present. Tympanic membrane is not erythematous, retracted or bulging.     Nose: Nose normal.     Mouth/Throat:     Mouth: Mucous membranes are moist.     Pharynx: Oropharynx is clear.  Eyes:     General:        Right eye: No discharge.        Left eye: No discharge.     Extraocular Movements: Extraocular movements intact.     Pupils: Pupils are equal, round, and reactive to light.  Cardiovascular:     Rate and Rhythm: Normal rate and regular rhythm.     Heart sounds: Normal heart sounds. No murmur  heard. Pulmonary:     Effort: Pulmonary effort is normal. No respiratory distress.     Breath sounds: Normal breath sounds. No wheezing or rhonchi.  Musculoskeletal:     Cervical back: Neck supple. No rigidity.  Lymphadenopathy:     Cervical: No cervical adenopathy.  Skin:    General: Skin is warm and dry.  Neurological:     General: No focal deficit present.     Mental Status: She is alert and oriented to person, place, and time.  Psychiatric:        Mood and Affect: Mood normal.        Behavior: Behavior normal.     No results found for any visits on 03/18/23.      Assessment & Plan:   Shaunie was seen today for cough.  Diagnoses and all orders for this visit:  Moderate persistent asthma with exacerbation Steroid IM injection today in office. Prednisone burst as below. Continue symbicort daily and albuterol prn. Strict return precautions given.  -     predniSONE (DELTASONE) 20 MG tablet; Take 1 tablet (20 mg total) by mouth daily with breakfast for 5 days. Start tomorrow if cough, chest tightness, and wheezing do not improve. -  methylPREDNISolone acetate (DEPO-MEDROL) injection 40 mg  Return if symptoms worsen or fail to improve.  The patient indicates understanding of these issues and agrees with the plan.   Gabriel Earing, FNP

## 2023-03-18 NOTE — Patient Instructions (Signed)
Asthma, Adult  Asthma is a long-term (chronic) condition that causes recurrent episodes in which the lower airways in the lungs become tight and narrow. The narrowing is caused by inflammation and tightening of the smooth muscle around the lower airways. Asthma episodes, also called asthma attacks or asthma flares, may cause coughing, making high-pitched whistling sounds when you breathe, most often when you breathe out (wheezing), shortness of breath, and chest pain. The airways may produce extra mucus caused by the inflammation and irritation. During an attack, it can be difficult to breathe. Asthma attacks can range from minor to life-threatening. Asthma cannot be cured, but medicines and lifestyle changes can help control it and treat acute attacks. It is important to keep your asthma well controlled so the condition does not interfere with your daily life. What are the causes? This condition is believed to be caused by inherited (genetic) and environmental factors, but its exact cause is not known. What can trigger an asthma attack? Many things can bring on an asthma attack or make symptoms worse. These triggers are different for every person. Common triggers include: Allergens and irritants like mold, dust, pet dander, cockroaches, pollen, air pollution, and chemical odors. Cigarette smoke. Weather changes and cold air. Stress and strong emotional responses such as crying or laughing hard. Certain medications such as aspirin or beta blockers. Infections and inflammatory conditions, such as the flu, a cold, pneumonia, or inflammation of the nasal membranes (rhinitis). Gastroesophageal reflux disease (GERD). What are the signs or symptoms? Symptoms may occur right after exposure to an asthma trigger or hours later and can vary by person. Common signs and symptoms include: Wheezing. Trouble breathing (shortness of breath). Excessive nighttime or early morning coughing. Chest  tightness. Tiredness (fatigue) with minimal activity. Difficulty talking in complete sentences. Poor exercise tolerance. How is this diagnosed? This condition is diagnosed based on: A physical exam and your medical history. Tests, which may include: Lung function studies to evaluate the flow of air in your lungs. Allergy tests. Imaging tests, such as X-rays. How is this treated? There is no cure, but symptoms can be controlled with proper treatment. Treatment usually involves: Identifying and avoiding your asthma triggers. Inhaled medicines. Two types are commonly used to treat asthma, depending on severity: Controller medicines. These help prevent asthma symptoms from occurring. They are taken every day. Fast-acting reliever or rescue medicines. These quickly relieve asthma symptoms. They are used as needed and provide short-term relief. Using other medicines, such as: Allergy medicines, such as antihistamines, if your asthma attacks are triggered by allergens. Immune medicines (immunomodulators). These are medicines that help control the immune system. Using supplemental oxygen. This is only needed during a severe episode. Creating an asthma action plan. An asthma action plan is a written plan for managing and treating your asthma attacks. This plan includes: A list of your asthma triggers and how to avoid them. Information about when medicines should be taken and when their dosage should be changed. Instructions about using a device called a peak flow meter. A peak flow meter measures how well the lungs are working and the severity of your asthma. It helps you monitor your condition. Follow these instructions at home: Take over-the-counter and prescription medicines only as told by your health care provider. Stay up to date on all vaccinations as recommended by your healthcare provider, including vaccines for the flu and pneumonia. Use a peak flow meter and keep track of your peak flow  readings. Understand and use your asthma   action plan to address any asthma flares. Do not smoke or allow anyone to smoke in your home. Contact a health care provider if: You have wheezing, shortness of breath, or a cough that is not responding to medicines. Your medicines are causing side effects, such as a rash, itching, swelling, or trouble breathing. You need to use a reliever medicine more than 2-3 times a week. Your peak flow reading is still at 50-79% of your personal best after following your action plan for 1 hour. You have a fever and shortness of breath. Get help right away if: You are getting worse and do not respond to treatment during an asthma attack. You are short of breath when at rest or when doing very little physical activity. You have difficulty eating, drinking, or talking. You have chest pain or tightness. You develop a fast heartbeat or palpitations. You have a bluish color to your lips or fingernails. You are light-headed or dizzy, or you faint. Your peak flow reading is less than 50% of your personal best. You feel too tired to breathe normally. These symptoms may be an emergency. Get help right away. Call 911. Do not wait to see if the symptoms will go away. Do not drive yourself to the hospital. Summary Asthma is a long-term (chronic) condition that causes recurrent episodes in which the airways become tight and narrow. Asthma episodes, also called asthma attacks or asthma flares, can cause coughing, wheezing, shortness of breath, and chest pain. Asthma cannot be cured, but medicines and lifestyle changes can help keep it well controlled and prevent asthma flares. Make sure you understand how to avoid triggers and how and when to use your medicines. Asthma attacks can range from minor to life-threatening. Get help right away if you have an asthma attack and do not respond to treatment with your usual rescue medicines. This information is not intended to replace  advice given to you by your health care provider. Make sure you discuss any questions you have with your health care provider. Document Revised: 08/06/2021 Document Reviewed: 07/28/2021 Elsevier Patient Education  2023 [employer]  

## 2023-03-22 ENCOUNTER — Ambulatory Visit: Payer: Medicaid Other | Admitting: Internal Medicine

## 2023-03-30 ENCOUNTER — Encounter: Payer: Self-pay | Admitting: Internal Medicine

## 2023-03-30 ENCOUNTER — Encounter: Payer: 59 | Attending: Internal Medicine | Admitting: Internal Medicine

## 2023-03-30 VITALS — BP 121/73 | HR 72 | Resp 14 | Ht 65.75 in | Wt 127.0 lb

## 2023-03-30 DIAGNOSIS — M810 Age-related osteoporosis without current pathological fracture: Secondary | ICD-10-CM | POA: Diagnosis not present

## 2023-03-30 DIAGNOSIS — M154 Erosive (osteo)arthritis: Secondary | ICD-10-CM | POA: Diagnosis not present

## 2023-03-30 DIAGNOSIS — E559 Vitamin D deficiency, unspecified: Secondary | ICD-10-CM | POA: Diagnosis not present

## 2023-03-30 NOTE — Progress Notes (Signed)
Office Visit Note  Patient: Kristin Wallace             Date of Birth: [birth year]           MRN: [id number]             PCP: Sonny Masters, FNP Referring: Daryll Drown, NP Visit Date: 03/30/2023   Subjective:  Follow-up   History of Present Illness: Kristin Wallace is a 67 y.o. 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  She stopped taking the hydroxychloroquine there is concern of associated QT prolongation.  After stopping that she did not notice any significant difference in joint pain or stiffness.  No significant change in bony nodules or hand function.  She had her Reclast infusion in March experienced low blood pressure during the treatment and had a low-grade fever in the days immediately following.  These resolved without any specific intervention.  Previous HPI 09/29/22 Kristin Wallace is a 67 y.o. female here for follow up for erosive osteoarthritis and osteoporosis on HCQ 200 mg daily and raloxifene. Overall symptoms have been doing pretty well. New swelling at her right wrist started about 2 or 3 weeks ago that is mildly painful. Dropping items unintentionally from both hands some and has been modifying her grip for this. She has scheduled for multiple teeth fillings coming up in the next month with possible extractions if unsuccessful. Plans for f/u with additional teeth sometime after that.    Previous HPI 03/25/22 [name] is a 67 y.o. female here for follow up for erosive OA and osteoporosis.  She is overall doing pretty well.  She still has impaired mobility and dexterity with the finger deformities but pain is somewhat decreased.  No major flareup with increase in localized joint swelling.   Previous HPI 12/23/2021 Kristin Wallace is a 67 y.o. female here for erosive OA and osteoporosis. 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 She was prescribed hydroxychloroquine and treated with PIP joint steroid injection. Imaging of this previously looked consistent with erosive OA of the hands. She continues to have pain and stiffness worst in the right hand lasting much of the day. She takes lyrica more for leg neuropathy than joint pains which is somewhat helpful. For osteoporosis she started raloxifene due to need for ongoing dental procedures so avoided bisphosphonate treatment. She has some multiple toe fractures over the years and has mild neuropathy but no falls or fragility fractures.   Review of Systems  Constitutional:  Positive for fatigue.  HENT:  Positive for mouth dryness. Negative for mouth sores.   Eyes:  Positive for dryness.  Respiratory:  Positive for shortness of breath.   Cardiovascular:  Negative for chest pain and palpitations.  Gastrointestinal:  Positive for diarrhea. Negative for blood in stool and constipation.  Endocrine: Positive for increased urination.  Genitourinary:  Negative for involuntary urination.  Musculoskeletal:  Positive for joint pain, gait problem, joint pain, joint swelling, muscle weakness, morning stiffness and muscle tenderness. Negative for myalgias and myalgias.  Skin:  Negative for color change, rash, hair loss and sensitivity to sunlight.  Allergic/Immunologic: Negative for susceptible to infections.  Neurological:  Positive for headaches. Negative for dizziness.  Hematological:  Negative for swollen glands.  Psychiatric/Behavioral:  Positive for sleep disturbance. Negative for depressed mood. The patient is not nervous/anxious.     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 01/18/2022   Episodic weakness/lightheadedness when upright few minutes better with drinking fluid 01/18/2022   Erosive osteoarthritis of both hands 12/23/2021    Hoarseness of voice 12/23/2021   Vitamin D deficiency 12/23/2021   Age-related osteoporosis without current pathological fracture 12/23/2021   Mild intermittent asthma without complication 10/13/2021   Paroxysmal atrial fibrillation (HCC) 05/06/2016    Past Medical History:  Diagnosis Date   Arthritis    erosive osteoarthritis   Asthma    Atrial fibrillation (HCC)    Chronically dry eyes    Fatigue    chronic ( and weakness)   GERD (gastroesophageal reflux disease)    Lyme disease    Neuromuscular disorder (HCC)    neuropathy   Neuropathy    legs and feet   Osteoporosis    Uses continuous positive airway pressure (CPAP) ventilation at home     Family History  Problem Relation Age of Onset   Depression Mother    Other Mother        suicide   Other Father        plane crash   Heart disease Sister    Cancer Brother    Cancer Brother        skin cancer   Depression Daughter    Thyroid disease Daughter    Migraines Daughter    Colon cancer Neg Hx    Past Surgical History:  Procedure Laterality Date   ABDOMINAL HYSTERECTOMY     BREAST SURGERY Left    neg tumor   COLONOSCOPY     COLONOSCOPY WITH PROPOFOL N/A 07/07/2022   Procedure: COLONOSCOPY WITH PROPOFOL;  Surgeon: Lanelle Bal, DO;  [facility];  Service: Endoscopy;  Laterality: N/A;  1:15pm, ASA 2, per office pt can't move up   ESOPHAGOGASTRODUODENOSCOPY     POLYPECTOMY  07/07/2022   Procedure: POLYPECTOMY;  Surgeon: Lanelle Bal, DO;  [facility]: [facility];  Service: Endoscopy;;   RIGHT AND LEFT HEART CATH     TUBAL LIGATION     Social History   Social History Narrative   Moved here from [place] this year 07/27/2021.   Daughter lives with her   Right handed   Immunization History  Administered Date(s) Administered   Influenza Split 09/02/2013   Moderna SARS-COV2 Booster Vaccination 09/06/2020, 05/17/2021   Moderna Sars-Covid-2 Vaccination 11/13/2019, 12/11/2019   Pneumococcal  Polysaccharide-23 01/02/2021   Td 01/17/2010   Tdap 04/06/2018   Zoster Recombinat (Shingrix) 09/18/2020, 01/03/2021     Objective: Vital Signs: BP 121/73 (BP Location: Left Arm, Patient Position: Sitting, Cuff Size: Normal)   Pulse 72   Resp 14   Ht 5' 5.75" (1.67 m)   Wt 127 lb (57.6 kg)   BMI 20.65 kg/m    Physical Exam Cardiovascular:     Rate and Rhythm: Normal rate and regular rhythm.  Pulmonary:     Effort: Pulmonary effort is normal.     Breath sounds: Normal breath sounds.  Skin:    General: Skin is warm and dry.  Neurological:     Mental Status: She is alert.  Psychiatric:        Mood and Affect: Mood normal.      Musculoskeletal Exam:  Shoulders full ROM no tenderness or swelling Elbows full ROM no tenderness or swelling Wrists full ROM no  tenderness or swelling Fingers Heberden's nodes throughout both hands, advanced squaring of left first CMC joint, right DIP joint fused no associated swelling Knees full ROM no tenderness or swelling   Investigation: No additional findings.  Imaging: No results found.  Recent Labs: Lab Results  Component Value Date   WBC 5.3 12/30/2022   HGB 12.1 12/30/2022   PLT 291 12/30/2022   NA 140 03/30/2023   K 4.2 03/30/2023   CL 103 03/30/2023   CO2 29 03/30/2023   GLUCOSE 91 03/30/2023   BUN 8 03/30/2023   CREATININE 0.48 (L) 03/30/2023   BILITOT 0.3 03/30/2023   ALKPHOS 59 12/30/2022   AST 18 03/30/2023   ALT 15 03/30/2023   PROT 7.0 03/30/2023   ALBUMIN 4.2 12/30/2022   CALCIUM 9.6 03/30/2023    Speciality Comments: PLQ Eye Exam 03/11/2023 Normal [facility] f/u 12 months.  Procedures:  No procedures performed Allergies: Other and Shellfish allergy   Assessment / Plan:     Visit Diagnoses: Erosive osteoarthritis of both hands - Plan: Sedimentation rate  So far has not seen a significant difference in symptoms with stopping the hydroxychloroquine.  Will recheck sedimentation rate for  inflammatory disease activity assessment.  If severely elevated would consider trying alternate DMARD.  She is okay for continuing symptomatic management currently uses turmeric supplement is also on Lyrica and may be helping for joint pain.  Age-related osteoporosis without current pathological fracture  Vitamin D deficiency - Plan: COMPLETE METABOLIC PANEL WITH GFR Plan: VITAMIN D 25 Hydroxy (Vit-D Deficiency, Fractures)  Mild reaction to Reclast infusion discussed that is fairly common and typically does not occur or is less severe with repeat doses.  Will check metabolic panel for renal function and calcium.  Also rechecking vitamin D level.  Assuming no problem continue current medication would recommend 2 years before repeat bone density testing to assess treatment response.  Orders: Orders Placed This Encounter  Procedures   Sedimentation rate   COMPLETE METABOLIC PANEL WITH GFR   VITAMIN D 25 Hydroxy (Vit-D Deficiency, Fractures)   No orders of the defined types were placed in this encounter.    Follow-Up Instructions: Return in about 1 year (around 03/29/2024) for OP on reclast/EOH f/u 73yr.   Fuller Plan, MD  Note - This record has been created using [employer].  Chart creation errors have been sought, but may not always  have been located. Such creation errors do not reflect on  the standard of medical care.

## 2023-03-31 LAB — COMPLETE METABOLIC PANEL WITH GFR
AG Ratio: 1.7 (calc) (ref 1.0–2.5)
ALT: 15 U/L (ref 6–29)
AST: 18 U/L (ref 10–35)
Albumin: 4.4 g/dL (ref 3.6–5.1)
Alkaline phosphatase (APISO): 39 U/L (ref 37–153)
BUN/Creatinine Ratio: 17 (calc) (ref 6–22)
BUN: 8 mg/dL (ref 7–25)
CO2: 29 mmol/L (ref 20–32)
Calcium: 9.6 mg/dL (ref 8.6–10.4)
Chloride: 103 mmol/L (ref 98–110)
Creat: 0.48 mg/dL — ABNORMAL LOW (ref 0.50–1.05)
Globulin: 2.6 g/dL (calc) (ref 1.9–3.7)
Glucose, Bld: 91 mg/dL (ref 65–99)
Potassium: 4.2 mmol/L (ref 3.5–5.3)
Sodium: 140 mmol/L (ref 135–146)
Total Bilirubin: 0.3 mg/dL (ref 0.2–1.2)
Total Protein: 7 g/dL (ref 6.1–8.1)
eGFR: 103 mL/min/{1.73_m2} (ref >=60)

## 2023-03-31 LAB — VITAMIN D 25 HYDROXY (VIT D DEFICIENCY, FRACTURES): Vit D, 25-Hydroxy: 64 ng/mL (ref 30–100)

## 2023-03-31 LAB — SEDIMENTATION RATE: Sed Rate: 2 mm/h (ref 0–30)

## 2023-03-31 NOTE — Progress Notes (Signed)
Lab results all look good. No problems with the reclast. No increase in sedimentation rate after stopping the hydroxychloroquine.

## 2023-05-10 ENCOUNTER — Other Ambulatory Visit: Payer: Self-pay | Admitting: Family Medicine

## 2023-05-10 DIAGNOSIS — G629 Polyneuropathy, unspecified: Secondary | ICD-10-CM

## 2023-05-13 ENCOUNTER — Ambulatory Visit: Payer: 59 | Admitting: Family Medicine

## 2023-05-13 ENCOUNTER — Encounter: Payer: Self-pay | Admitting: Family Medicine

## 2023-05-13 VITALS — BP 109/65 | HR 76 | Temp 98.1°F | Ht 65.75 in | Wt 125.6 lb

## 2023-05-13 DIAGNOSIS — Z130 Encounter for screening for diseases of the blood and blood-forming organs and certain disorders involving the immune mechanism: Secondary | ICD-10-CM

## 2023-05-13 DIAGNOSIS — Z23 Encounter for immunization: Secondary | ICD-10-CM | POA: Diagnosis not present

## 2023-05-13 DIAGNOSIS — M810 Age-related osteoporosis without current pathological fracture: Secondary | ICD-10-CM

## 2023-05-13 DIAGNOSIS — Z13220 Encounter for screening for lipoid disorders: Secondary | ICD-10-CM

## 2023-05-13 DIAGNOSIS — G629 Polyneuropathy, unspecified: Secondary | ICD-10-CM

## 2023-05-13 DIAGNOSIS — Z1329 Encounter for screening for other suspected endocrine disorder: Secondary | ICD-10-CM

## 2023-05-13 DIAGNOSIS — Z13228 Encounter for screening for other metabolic disorders: Secondary | ICD-10-CM

## 2023-05-13 DIAGNOSIS — Z0000 Encounter for general adult medical examination without abnormal findings: Secondary | ICD-10-CM

## 2023-05-13 MED ORDER — PREGABALIN 75 MG PO CAPS: 75.0000 mg | ORAL_CAPSULE | Freq: Two times a day (BID) | ORAL | 3 refills | Status: DC

## 2023-05-13 NOTE — Progress Notes (Signed)
Complete physical exam  Patient: Kristin Wallace   DOB: [birth year]   67 y.o. Female  MRN: [id number]  Subjective:    Chief Complaint  Patient presents with   Annual Exam    Kristin Wallace is a 67 y.o. female who presents today for a complete physical exam. She reports consuming a general diet.  She is active daily.  She generally feels well. She reports sleeping well. She does not have additional problems to discuss today.    Most recent fall risk assessment:    05/13/2023    2:23 PM  Fall Risk   Falls in the past year? 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 01/18/2022   Episodic weakness/lightheadedness when upright few minutes better with drinking fluid 01/18/2022   Erosive osteoarthritis of both hands 12/23/2021   Hoarseness of voice 12/23/2021   Vitamin D deficiency 12/23/2021   Age-related osteoporosis without current pathological fracture 12/23/2021   Mild intermittent asthma without complication 10/13/2021   Paroxysmal atrial fibrillation (HCC) 05/06/2016   Past Medical History:  Diagnosis Date   Arthritis    erosive osteoarthritis   Asthma    Atrial fibrillation (HCC)    Chronically dry eyes    Fatigue    chronic ( and weakness)   GERD (gastroesophageal reflux disease)    Lyme disease    Neuromuscular disorder (HCC)    neuropathy   Neuropathy    legs and feet   Osteoporosis    Uses continuous positive airway pressure (CPAP) ventilation at home    Past Surgical History:  Procedure Laterality Date   ABDOMINAL HYSTERECTOMY     BREAST SURGERY Left    neg tumor    COLONOSCOPY     COLONOSCOPY WITH PROPOFOL N/A 07/07/2022   Procedure: COLONOSCOPY WITH PROPOFOL;  Surgeon: Lanelle Bal, DO;  [facility];  Service: Endoscopy;  Laterality: N/A;  1:15pm, ASA 2, per office pt can't move up   ESOPHAGOGASTRODUODENOSCOPY     POLYPECTOMY  07/07/2022   Procedure: POLYPECTOMY;  Surgeon: Lanelle Bal, DO;  [facility];  Service: Endoscopy;;   RIGHT AND LEFT HEART CATH     TUBAL LIGATION     Social History   Tobacco Use   Smoking status: Never    Passive exposure: Never   Smokeless tobacco: Never  Vaping Use   Vaping status: Never Used  Substance Use Topics   Alcohol use: Yes    Comment: 3 yearly   Drug use: Never   Social History   Socioeconomic History   Marital status: Divorced    Spouse name: Not on file   Number of children: 3   Years of education: Not on file   Highest education level: 12th grade  Occupational History   Occupation: retired  Tobacco Use   Smoking status: Never    Passive exposure: Never   Smokeless tobacco: Never  Vaping Use   Vaping status: Never Used  Substance and Sexual Activity   Alcohol use: Yes  Comment: 3 yearly   Drug use: Never   Sexual activity: Not on file  Other Topics Concern   Not on file  Social History Narrative   Moved here from [place] this year 07/27/2021.   Daughter lives with her   Right handed   Social Determinants of Health   Financial Resource Strain: Medium Risk (03/17/2023)   Overall Financial Resource Strain (CARDIA)    Difficulty of Paying Living Expenses: Somewhat hard  Food Insecurity: Food Insecurity Present (03/17/2023)   Hunger Vital Sign    Worried About Running Out of Food in the Last Year: Sometimes true    Ran Out of Food in the Last Year: Sometimes true  Transportation Needs: No Transportation Needs (03/17/2023)   PRAPARE - Administrator, Civil Service (Medical): No    Lack of Transportation (Non-Medical): No  Physical Activity:  Sufficiently Active (03/17/2023)   Exercise Vital Sign    Days of Exercise per Week: 7 days    Minutes of Exercise per Session: 30 min  Stress: No Stress Concern Present (03/17/2023)   [employer] of Occupational Health - Occupational Stress Questionnaire    Feeling of Stress : Only a little  Social Connections: Moderately Integrated (03/17/2023)   Social Connection and Isolation Panel [NHANES]    Frequency of Communication with Friends and Family: More than three times a week    Frequency of Social Gatherings with Friends and Family: Three times a week    Attends Religious Services: More than 4 times per year    Active Member of Clubs or Organizations: Yes    Attends Banker Meetings: More than 4 times per year    Marital Status: Divorced  Intimate Partner Violence: Not At Risk (11/26/2022)   Humiliation, Afraid, Rape, and Kick questionnaire    Fear of Current or Ex-Partner: No    Emotionally Abused: No    Physically Abused: No    Sexually Abused: No   Family Status  Relation Name Status   Mother  Deceased   Father  Deceased   Sister  Deceased   Brother  Deceased   Brother  Alive   Daughter  Alive   Daughter  Alive   Son  Alive   MGM  Deceased   MGF  Deceased   PGM  Deceased   PGF  Deceased   Neg Hx  (Not Specified)  No partnership data on file   Family History  Problem Relation Age of Onset   Depression Mother    Other Mother        suicide   Other Father        plane crash   Heart disease Sister    Cancer Brother    Cancer Brother        skin cancer   Depression Daughter    Thyroid disease Daughter    Migraines Daughter    Colon cancer Neg Hx    Allergies  Allergen Reactions   Other Shortness Of Breath    Dairy   Shellfish Allergy Other (See Comments) and Nausea And Vomiting    Vomiting      Patient Care Team: Sonny Masters, FNP as PCP - General (Family Medicine)   Outpatient Medications Prior to Visit  Medication Sig   albuterol  (VENTOLIN HFA) 108 (90 Base) MCG/ACT inhaler Inhale into the lungs.   Ascorbic Acid (VITAMIN C) 1000 MG tablet Take 1,000 mg by mouth in the morning.   budesonide-formoterol (SYMBICORT) 160-4.5  MCG/ACT inhaler Inhale 2 puffs into the lungs 2 (two) times daily.   Calcium Carb-Cholecalciferol (CALCIUM 600+D3 PO) Take 1 tablet by mouth every morning.   carboxymethylcellul-glycerin (OPTIVE) 0.5-0.9 % ophthalmic solution Place 1 drop into both eyes 2 (two) times daily as needed for dry eyes.   Cholecalciferol (VITAMIN D3) 125 MCG (5000 UT) CAPS Take 5,000 Units by mouth in the morning.   CINNAMON PO Take 1 Dose by mouth See admin instructions. 1 teaspoonful twice daily with 2 teaspoonful of honey   cycloSPORINE (RESTASIS) 0.05 % ophthalmic emulsion Place 2 drops into both eyes 2 (two) times daily.   montelukast (SINGULAIR) 10 MG tablet TAKE 1 TABLET BY MOUTH AT  BEDTIME   Multiple Vitamins-Minerals (DRY EYE FORMULA) CAPS Take 3 capsules by mouth in the morning.   Multiple Vitamins-Minerals (MULTIVITAMIN WOMEN 50+) TABS Take 1 tablet by mouth in the morning.   pantoprazole (PROTONIX) 40 MG tablet TAKE 1 TABLET BY MOUTH DAILY   PSYLLIUM HUSK PO Take 1 Dose by mouth in the morning.   SODIUM FLUORIDE 5000 PPM 1.1 % PSTE Take by mouth.   TURMERIC CURCUMIN PO Take 1 capsule by mouth in the morning and at bedtime. W/Ginger Powder   zoledronic acid (RECLAST) 5 MG/100ML SOLN injection Inject into the vein.   [DISCONTINUED] pregabalin (LYRICA) 75 MG capsule Take 1 capsule (75 mg total) by mouth 2 (two) times daily.   No facility-administered medications prior to visit.    Review of Systems  Respiratory:  Negative for cough, hemoptysis, sputum production, shortness of breath and wheezing.   Genitourinary:  Negative for dysuria, flank pain, frequency, hematuria and urgency.  Musculoskeletal:  Negative for back pain, falls, joint pain, myalgias and neck pain.  Neurological:        Numbness and tingling in  bilateral feet/lower legs  All other systems reviewed and are negative.       Objective:     BP 109/65   Pulse 76   Temp 98.1 F (36.7 C) (Temporal)   Ht 5' 5.75" (1.67 m)   Wt 125 lb 9.6 oz (57 kg)   SpO2 97%   BMI 20.43 kg/m  BP Readings from Last 3 Encounters:  05/13/23 109/65  03/30/23 121/73  03/18/23 108/62   Wt Readings from Last 3 Encounters:  05/13/23 125 lb 9.6 oz (57 kg)  03/30/23 127 lb (57.6 kg)  03/18/23 124 lb 4 oz (56.4 kg)   SpO2 Readings from Last 3 Encounters:  05/13/23 97%  03/18/23 100%  03/05/23 98%      Physical Exam Vitals and nursing note reviewed.  Constitutional:      General: She is not in acute distress.    Appearance: Normal appearance. She is well-developed, well-groomed and normal weight. She is not ill-appearing, toxic-appearing or diaphoretic.  HENT:     Head: Normocephalic and atraumatic.     Jaw: There is normal jaw occlusion.     Right Ear: Hearing, tympanic membrane, ear canal and external ear normal.     Left Ear: Hearing, tympanic membrane, ear canal and external ear normal.     Nose: Nose normal.     Mouth/Throat:     Lips: Pink.     Mouth: Mucous membranes are moist.     Pharynx: Oropharynx is clear. Uvula midline.  Eyes:     General: Lids are normal.     Extraocular Movements: Extraocular movements intact.     Conjunctiva/sclera: Conjunctivae normal.     Pupils: Pupils  are equal, round, and reactive to light.  Neck:     Thyroid: No thyroid mass, thyromegaly or thyroid tenderness.     Vascular: No carotid bruit or JVD.     Trachea: Trachea and phonation normal.  Cardiovascular:     Rate and Rhythm: Normal rate and regular rhythm.     Chest Wall: PMI is not displaced.     Pulses: Normal pulses.     Heart sounds: Normal heart sounds. No murmur heard.    No friction rub. No gallop.  Pulmonary:     Effort: Pulmonary effort is normal. No respiratory distress.     Breath sounds: Normal breath sounds. No wheezing.   Abdominal:     General: Bowel sounds are normal. There is no distension or abdominal bruit.     Palpations: Abdomen is soft. There is no hepatomegaly or splenomegaly.     Tenderness: There is no abdominal tenderness. There is no right CVA tenderness or left CVA tenderness.     Hernia: No hernia is present.  Musculoskeletal:        General: Normal range of motion.     Cervical back: Normal range of motion and neck supple.     Right lower leg: No edema.     Left lower leg: No edema.  Lymphadenopathy:     Cervical: No cervical adenopathy.  Skin:    General: Skin is warm and dry.     Capillary Refill: Capillary refill takes less than 2 seconds.     Coloration: Skin is not cyanotic, jaundiced or pale.     Findings: No rash.  Neurological:     General: No focal deficit present.     Mental Status: She is alert and oriented to person, place, and time.     Sensory: Sensation is intact.     Motor: Motor function is intact.     Coordination: Coordination is intact.     Gait: Gait is intact.     Deep Tendon Reflexes: Reflexes are normal and symmetric.  Psychiatric:        Attention and Perception: Attention and perception normal.        Mood and Affect: Mood and affect normal.        Speech: Speech normal.        Behavior: Behavior normal. Behavior is cooperative.        Thought Content: Thought content normal.        Cognition and Memory: Cognition and memory normal.        Judgment: Judgment normal.      Last CBC Lab Results  Component Value Date   WBC 5.3 12/30/2022   HGB 12.1 12/30/2022   HCT 37.4 12/30/2022   MCV 91 12/30/2022   MCH 29.4 12/30/2022   RDW 13.2 12/30/2022   PLT 291 12/30/2022   Last metabolic panel Lab Results  Component Value Date   GLUCOSE 91 03/30/2023   NA 140 03/30/2023   K 4.2 03/30/2023   CL 103 03/30/2023   CO2 29 03/30/2023   BUN 8 03/30/2023   CREATININE 0.48 (L) 03/30/2023   EGFR 103 03/30/2023   CALCIUM 9.6 03/30/2023   PROT 7.0  03/30/2023   ALBUMIN 4.2 12/30/2022   LABGLOB 2.5 12/30/2022   AGRATIO 1.7 12/30/2022   BILITOT 0.3 03/30/2023   ALKPHOS 59 12/30/2022   AST 18 03/30/2023   ALT 15 03/30/2023   Last lipids Lab Results  Component Value Date   CHOL 149 12/24/2021  HDL 61 12/24/2021   LDLCALC 73 12/24/2021   TRIG 81 12/24/2021   CHOLHDL 2.4 12/24/2021   Last hemoglobin A1c No results found for: "HGBA1C" Last thyroid functions Lab Results  Component Value Date   TSH 1.410 12/30/2022   T4TOTAL 7.8 12/30/2022   Last vitamin D Lab Results  Component Value Date   VD25OH 64 03/30/2023   Last vitamin B12 and Folate Lab Results  Component Value Date   VITAMINB12 >2000 (H) 12/30/2022   FOLATE >20.0 12/30/2022        Assessment & Plan:    Routine Health Maintenance and Physical Exam  Immunization History  Administered Date(s) Administered   Influenza Split 09/02/2013   Moderna SARS-COV2 Booster Vaccination 09/06/2020, 05/17/2021   Moderna Sars-Covid-2 Vaccination 11/13/2019, 12/11/2019   Pneumococcal Polysaccharide-23 01/02/2021   Td 01/17/2010   Tdap 04/06/2018   Zoster Recombinant(Shingrix) 09/18/2020, 01/03/2021    Health Maintenance  Topic Date Due   Pneumonia Vaccine 33+ Years old (2 of 2 - PCV) 12/31/2023 (Originally 01/02/2022)   INFLUENZA VACCINE  06/03/2023   Medicare Annual Wellness (AWV)  11/27/2023   DEXA SCAN  12/25/2023   MAMMOGRAM  02/10/2024   Colonoscopy  07/08/2027   DTaP/Tdap/Td (3 - Td or Tdap) 04/06/2028   Hepatitis C Screening  Completed   Zoster Vaccines- Shingrix  Completed   HPV VACCINES  Aged Out   COVID-19 Vaccine  Discontinued    Discussed health benefits of physical activity, and encouraged her to engage in regular exercise appropriate for her age and condition.  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   Relevant Medications   pregabalin (LYRICA) 75 MG  capsule   Other Relevant Orders   Anemia Profile B     Musculoskeletal and Integument   Age-related osteoporosis without current pathological fracture   Relevant Orders   CMP14+EGFR   VITAMIN D 25 Hydroxy (Vit-D Deficiency, Fractures)   Other Visit Diagnoses     Annual physical exam    -  Primary   Relevant Orders   Anemia Profile B   CMP14+EGFR   Lipid panel   Thyroid Panel With TSH   VITAMIN D 25 Hydroxy (Vit-D Deficiency, Fractures)   Screening for deficiency anemia       Relevant Orders   Anemia Profile B   Screening for metabolic disorder       Relevant Orders   CMP14+EGFR   Screening for lipid disorders       Relevant Orders   Lipid panel   Screening for thyroid disorder       Relevant Orders   Thyroid Panel With TSH     Pneumonia vaccine needed and given today.  Return in about 6 months (around 11/13/2023), or if symptoms worsen or fail to improve, for chronic follow up.     The above assessment and management plan was discussed with the patient. The patient verbalized understanding of and has agreed to the management plan. Patient is aware to call the clinic if they develop any new symptoms or if symptoms fail to improve or worsen. Patient is aware when to return to the clinic for a follow-up visit. Patient educated on when it is appropriate to go to the emergency department.   Kari Baars, FNP-C [facility] [address] [place], [place] [address] [phone]

## 2023-05-13 NOTE — Addendum Note (Signed)
Addended by: Angela Adam on: 05/13/2023 03:19 PM   Modules accepted: Orders

## 2023-05-14 LAB — CMP14+EGFR
ALT: 21 IU/L (ref 0–32)
AST: 27 IU/L (ref 0–40)
Albumin: 4.3 g/dL (ref 3.9–4.9)
Alkaline Phosphatase: 40 IU/L — ABNORMAL LOW (ref 44–121)
BUN/Creatinine Ratio: 14 (ref 12–28)
BUN: 9 mg/dL (ref 8–27)
Bilirubin Total: 0.3 mg/dL (ref 0.0–1.2)
CO2: 26 mmol/L (ref 20–29)
Calcium: 10 mg/dL (ref 8.7–10.3)
Chloride: 102 mmol/L (ref 96–106)
Creatinine, Ser: 0.63 mg/dL (ref 0.57–1.00)
Globulin, Total: 2.5 g/dL (ref 1.5–4.5)
Glucose: 89 mg/dL (ref 70–99)
Potassium: 4.1 mmol/L (ref 3.5–5.2)
Sodium: 141 mmol/L (ref 134–144)
Total Protein: 6.8 g/dL (ref 6.0–8.5)
eGFR: 96 mL/min/{1.73_m2} (ref >59)

## 2023-05-14 LAB — ANEMIA PROFILE B
Basophils Absolute: 0 10*3/uL (ref 0.0–0.2)
Basos: 1 %
EOS (ABSOLUTE): 0.1 10*3/uL (ref 0.0–0.4)
Eos: 2 %
Ferritin: 60 ng/mL (ref 15–150)
Folate: >20 ng/mL (ref >3.0)
Hematocrit: 38.1 % (ref 34.0–46.6)
Hemoglobin: 12.4 g/dL (ref 11.1–15.9)
Immature Grans (Abs): 0 10*3/uL (ref 0.0–0.1)
Immature Granulocytes: 1 %
Iron Saturation: 20 % (ref 15–55)
Iron: 65 ug/dL (ref 27–139)
Lymphocytes Absolute: 1.3 10*3/uL (ref 0.7–3.1)
Lymphs: 30 %
MCH: 29.3 pg (ref 26.6–33.0)
MCHC: 32.5 g/dL (ref 31.5–35.7)
MCV: 90 fL (ref 79–97)
Monocytes Absolute: 0.5 10*3/uL (ref 0.1–0.9)
Monocytes: 11 %
Neutrophils Absolute: 2.5 10*3/uL (ref 1.4–7.0)
Neutrophils: 55 %
Platelets: 266 10*3/uL (ref 150–450)
RBC: 4.23 x10E6/uL (ref 3.77–5.28)
RDW: 14 % (ref 11.7–15.4)
Retic Ct Pct: 1.1 % (ref 0.6–2.6)
Total Iron Binding Capacity: 321 ug/dL (ref 250–450)
UIBC: 256 ug/dL (ref 118–369)
Vitamin B-12: >2000 pg/mL — ABNORMAL HIGH (ref 232–1245)
WBC: 4.4 10*3/uL (ref 3.4–10.8)

## 2023-05-14 LAB — LIPID PANEL
Chol/HDL Ratio: 2.7 ratio (ref 0.0–4.4)
Cholesterol, Total: 161 mg/dL (ref 100–199)
HDL: 60 mg/dL (ref >39)
LDL Chol Calc (NIH): 82 mg/dL (ref 0–99)
Triglycerides: 105 mg/dL (ref 0–149)
VLDL Cholesterol Cal: 19 mg/dL (ref 5–40)

## 2023-05-14 LAB — VITAMIN D 25 HYDROXY (VIT D DEFICIENCY, FRACTURES): Vit D, 25-Hydroxy: 63.8 ng/mL (ref 30.0–100.0)

## 2023-05-14 LAB — THYROID PANEL WITH TSH
Free Thyroxine Index: 1.5 (ref 1.2–4.9)
T3 Uptake Ratio: 24 % (ref 24–39)
T4, Total: 6.4 ug/dL (ref 4.5–12.0)
TSH: 1.56 u[IU]/mL (ref 0.450–4.500)

## 2023-05-19 ENCOUNTER — Other Ambulatory Visit: Payer: Self-pay

## 2023-05-27 ENCOUNTER — Telehealth: Payer: Self-pay | Admitting: Internal Medicine

## 2023-05-27 NOTE — Telephone Encounter (Signed)
Patient called stating since stopping her Hydroxychloroquine her joint pain has increased.  Patient requested a return call to discuss starting a new medication.

## 2023-07-01 ENCOUNTER — Other Ambulatory Visit: Payer: Self-pay | Admitting: Family Medicine

## 2023-07-01 DIAGNOSIS — K2100 Gastro-esophageal reflux disease with esophagitis, without bleeding: Secondary | ICD-10-CM

## 2023-07-01 DIAGNOSIS — J4520 Mild intermittent asthma, uncomplicated: Secondary | ICD-10-CM

## 2023-07-02 ENCOUNTER — Encounter (HOSPITAL_BASED_OUTPATIENT_CLINIC_OR_DEPARTMENT_OTHER): Payer: Self-pay | Admitting: Pulmonary Disease

## 2023-07-02 ENCOUNTER — Ambulatory Visit (HOSPITAL_BASED_OUTPATIENT_CLINIC_OR_DEPARTMENT_OTHER): Payer: 59 | Admitting: Pulmonary Disease

## 2023-07-02 VITALS — BP 114/68 | HR 58 | Resp 16 | Ht 65.75 in | Wt 124.8 lb

## 2023-07-02 DIAGNOSIS — J4530 Mild persistent asthma, uncomplicated: Secondary | ICD-10-CM | POA: Diagnosis not present

## 2023-07-02 DIAGNOSIS — G4733 Obstructive sleep apnea (adult) (pediatric): Secondary | ICD-10-CM | POA: Diagnosis not present

## 2023-07-02 NOTE — Progress Notes (Signed)
[facility]  Chief Complaint  Patient presents with   Follow-up    OSA on CPAP. Doing ok since last visit. Likes her CPAP.    Past Surgical History:  She  has a past surgical history that includes Tubal ligation; Abdominal hysterectomy; RIGHT AND LEFT HEART CATH; Breast surgery (Left); Colonoscopy; Esophagogastroduodenoscopy; Colonoscopy with propofol (N/A, 07/07/2022); and polypectomy (07/07/2022).  Past Medical History:  OA, A fib, GERD, Lyme disease, Neuropathy, Osteoporosis, Vit D deficiency  Constitutional:  BP 114/68   Pulse (!) 58   Resp 16   Ht 5' 5.75" (1.67 m)   Wt 124 lb 12.8 oz (56.6 kg)   SpO2 99%   BMI 20.30 kg/m   Brief Summary:  Kristin Wallace is a 67 y.o. female with asthma and obstructive sleep apnea.      Subjective:   Uses CPAP nightly.  No issues with mask fit or pressure.  Sleeping well and feels rested.  Using symbicort 1 puff bid and singulair at night.  Not having cough, wheeze, or sputum.  Doesn't need albuterol much.  Physical Exam:   Appearance - well kempt   ENMT - no sinus tenderness, no oral exudate, no LAN, Mallampati 2 airway, no stridor  Respiratory - equal breath sounds bilaterally, no wheezing or rales  CV - s1s2 regular rate and rhythm, no murmurs  Ext - no clubbing, no edema  Skin - no rashes  Psych - normal mood and affect    Pulmonary testing:    Chest imaging:  CT chest 12/18/20 >> apical scarring  Sleep testing:  HST 02/16/23 >> AHI 17.4, SpO2 low 87%  Auto CPAP 04/02/23 to 06/30/23 >> used on 89 of 90 nights with average 6 hrs 53 min.  Average AHI 4.1 with median CPAP 6 and 95 th percentile CPAP 11 cm H2O  Cardiac testing:  Echo 10/28/22 >> EF 50 to 55%, mild MR, mild LA dilation  Social History:  She  reports that she has never smoked. She has never been exposed to tobacco smoke. She has never used smokeless tobacco. She reports current alcohol use. She reports that she  does not use drugs.  Family History:  Her family history includes Cancer in her brother and brother; Depression in her daughter and mother; Heart disease in her sister; Migraines in her daughter; Other in her father and mother; Thyroid disease in her daughter.     Assessment/Plan:   Obstructive sleep apnea. - she is compliant with CPAP and reports benefit from therapy - she uses [employer] for her DME - current CPAP ordered May 2024 - continue auto CPAP 5 to 15 cm H2O  Mild, persistent asthma. - previously seen by Dr. Herbert Seta in [place], [place]; unfortunately haven't been able to get medical records from there - will have her try stopping singulair - if she remains stable, then she can try reducing how much symbicort she uses until she gets to the point of only using it prn - prn albuterol - she has a spacer device  Palpitations, atypical chest pain. - followed by Dr. Rayetta Pigg Assar with [facility] cardiology in [place]  Erosive osteoarthritis. - followed by Dr. Sheliah Hatch with rheumatology  Time Spent Involved in Patient Care on Day of Examination:  27 minutes  Follow up:   Patient Instructions  You can try stopping montelukast.  If your breathing stays okay, then you can try reducing how often you use symbicort.  Follow up in 6  months.  Medication List:   Allergies as of 07/02/2023       Reactions   Other Shortness Of Breath   Dairy   Shellfish Allergy Other (See Comments), Nausea And Vomiting   Vomiting        Medication List        Accurate as of July 02, 2023  3:49 PM. If you have any questions, ask your nurse or doctor.          albuterol 108 (90 Base) MCG/ACT inhaler Commonly known as: VENTOLIN HFA Inhale into the lungs.   budesonide-formoterol 160-4.5 MCG/ACT inhaler Commonly known as: SYMBICORT Inhale 2 puffs into the lungs 2 (two) times daily.   CALCIUM 600+D3 PO Take 1 tablet by mouth every morning.   CINNAMON PO Take 1 Dose by mouth See  admin instructions. 1 teaspoonful twice daily with 2 teaspoonful of honey   cycloSPORINE 0.05 % ophthalmic emulsion Commonly known as: RESTASIS Place 2 drops into both eyes 2 (two) times daily.   montelukast 10 MG tablet Commonly known as: SINGULAIR TAKE 1 TABLET BY MOUTH AT  BEDTIME   Multivitamin Women 50+ Tabs Take 1 tablet by mouth in the morning. What changed: Another medication with the same name was removed. Continue taking this medication, and follow the directions you see here. Changed by: Coralyn Helling   OPTIVE 0.5-0.9 % ophthalmic solution Generic drug: carboxymethylcellul-glycerin Place 1 drop into both eyes 2 (two) times daily as needed for dry eyes.   pantoprazole 40 MG tablet Commonly known as: PROTONIX TAKE 1 TABLET BY MOUTH DAILY   pregabalin 75 MG capsule Commonly known as: Lyrica Take 1 capsule (75 mg total) by mouth 2 (two) times daily.   PSYLLIUM HUSK PO Take 1 Dose by mouth in the morning.   Sodium Fluoride 5000 PPM 1.1 % Pste Generic drug: Sodium Fluoride Take by mouth.   TURMERIC CURCUMIN PO Take 1 capsule by mouth in the morning and at bedtime. W/Ginger Powder   vitamin C 1000 MG tablet Take 1,000 mg by mouth in the morning.   Vitamin D3 125 MCG (5000 UT) Caps Take 5,000 Units by mouth in the morning.   zoledronic acid 5 MG/100ML Soln injection Commonly known as: RECLAST Inject into the vein.        Signature:  Coralyn Helling, MD [facility] Pager - [phone] 07/02/2023, 3:49 PM

## 2023-07-02 NOTE — Patient Instructions (Signed)
You can try stopping montelukast.  If your breathing stays okay, then you can try reducing how often you use symbicort.  Follow up in 6 months.

## 2023-09-13 NOTE — Progress Notes (Signed)
Office Visit Note  Patient: Kristin Wallace             Date of Birth: Nov 09, 1955           MRN: [id number]             PCP: Sonny Masters, FNP Referring: Sonny Masters, FNP Visit Date: 09/27/2023   Subjective:  Follow-up (Patient states she believes her hands have gotten worse and is unsure of the reason. )  Discussed the use of AI scribe software for clinical note transcription with the patient, who gave verbal consent to proceed.  History of Present Illness   Kristin Wallace is a 67 y.o. female here for follow up for erosive osteoarthritis of the hand and osteoporosis.  She presents with increased pain in their hands over the past month and a half. They report that the pain is intermittent and can be severe, particularly in the fingers. They also note a sensation of neuropathy in their hands, in addition to their known neuropathy in their legs and feet. The patient has been off raloxifene for approximately three to four months, which they stopped after their last visit due to perceived lack of efficacy.  They describe a decrease in grip strength, leading to occasional dropping of objects.  She does have a previous history of carpal tunnel syndrome but not currently participating in any activities that provoke this in the past.  They also note changes in the appearance of their hands, with fingers appearing twisted and knobby, and express concern about the progression of these changes.  The patient also reports a history of Lyme disease, for which they were previously treated with prednisone. They experienced significant side effects from this medication, including hallucinations. They also take Lyrica for neuropathy.  The patient has been managing their symptoms with lifestyle modifications, such as adjusting how they hold objects to prevent dropping them. They also see a chiropractor monthly, which has improved their balance and reduced their need for a cane.   Previous  HPI 03/30/2023 Kristin Wallace is a 67 y.o. 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  She stopped taking the hydroxychloroquine there is concern of associated QT prolongation.  After stopping that she did not notice any significant difference in joint pain or stiffness.  No significant change in bony nodules or hand function.  She had her Reclast infusion in March experienced low blood pressure during the treatment and had a low-grade fever in the days immediately following.  These resolved without any specific intervention.   Previous HPI 09/29/22 Kristin Wallace is a 67 y.o. female here for follow up for erosive osteoarthritis and osteoporosis on HCQ 200 mg daily and raloxifene. Overall symptoms have been doing pretty well. New swelling at her right wrist started about 2 or 3 weeks ago that is mildly painful. Dropping items unintentionally from both hands some and has been modifying her grip for this. She has scheduled for multiple teeth fillings coming up in the next month with possible extractions if unsuccessful. Plans for f/u with additional teeth sometime after that.    Previous HPI 03/25/22 Kristin Wallace is a 67 y.o. female here for follow up for erosive OA and osteoporosis.  She is overall doing pretty well.  She still has impaired mobility and dexterity with the finger deformities but pain is somewhat decreased.  No major flareup with increase in localized joint swelling.   Previous HPI 12/23/2021 Kristin Wallace is a  67 y.o. female here for erosive OA and osteoporosis. 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 She was prescribed hydroxychloroquine and treated with PIP joint steroid injection. Imaging of this previously looked consistent with erosive OA of the hands. She continues to have pain and stiffness worst in the right hand lasting much of the  day. She takes lyrica more for leg neuropathy than joint pains which is somewhat helpful. For osteoporosis she started raloxifene due to need for ongoing dental procedures so avoided bisphosphonate treatment. She has some multiple toe fractures over the years and has mild neuropathy but no falls or fragility fractures.   Review of Systems  Constitutional:  Positive for fatigue.  HENT:  Positive for mouth dryness. Negative for mouth sores.   Eyes:  Positive for dryness.  Respiratory:  Positive for shortness of breath.   Cardiovascular:  Positive for chest pain and palpitations.  Gastrointestinal:  Positive for diarrhea. Negative for blood in stool and constipation.  Endocrine: Negative for increased urination.  Genitourinary:  Negative for involuntary urination.  Musculoskeletal:  Positive for joint pain, gait problem, joint pain, joint swelling, muscle weakness, morning stiffness and muscle tenderness. Negative for myalgias and myalgias.  Skin:  Positive for sensitivity to sunlight. Negative for color change, rash and hair loss.  Allergic/Immunologic: Negative for susceptible to infections.  Neurological:  Negative for dizziness and headaches.  Hematological:  Negative for swollen glands.  Psychiatric/Behavioral:  Positive for depressed mood. Negative for sleep disturbance. The patient is nervous/anxious.     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 01/18/2022   Episodic weakness/lightheadedness when upright few minutes better with drinking fluid 01/18/2022   Erosive osteoarthritis of both hands 12/23/2021   Hoarseness of voice 12/23/2021   Vitamin D deficiency 12/23/2021   Age-related osteoporosis without current pathological fracture 12/23/2021   Mild intermittent asthma without complication 10/13/2021   Paroxysmal atrial  fibrillation (HCC) 05/06/2016    Past Medical History:  Diagnosis Date   Arthritis    erosive osteoarthritis   Asthma    Atrial fibrillation (HCC)    Chronically dry eyes    Fatigue    chronic ( and weakness)   GERD (gastroesophageal reflux disease)    Lyme disease    Neuromuscular disorder (HCC)    neuropathy   Neuropathy    legs and feet   Osteoporosis    Uses continuous positive airway pressure (CPAP) ventilation at home     Family History  Problem Relation Age of Onset   Depression Mother    Other Mother        suicide   Other Father        plane crash   Heart disease Sister    Cancer Brother    Cancer Brother        skin cancer   Depression Daughter    Thyroid disease Daughter    Migraines Daughter    Colon cancer Neg Hx    Past Surgical History:  Procedure Laterality Date   ABDOMINAL HYSTERECTOMY     BREAST SURGERY Left    neg tumor   COLONOSCOPY     COLONOSCOPY WITH PROPOFOL N/A 07/07/2022   Procedure: COLONOSCOPY WITH PROPOFOL;  Surgeon: Lanelle Bal, DO;  Location: [facility]  ENDO SUITE;  Service: Endoscopy;  Laterality: N/A;  1:15pm, ASA 2, per office pt can't move up   ESOPHAGOGASTRODUODENOSCOPY     POLYPECTOMY  07/07/2022   Procedure: POLYPECTOMY;  Surgeon: Lanelle Bal, DO;  Location: [facility]O [facility];  Service: Endoscopy;;   RIGHT AND LEFT HEART CATH     TUBAL LIGATION     Social History   Social History Narrative   Moved here from [place] this year 07/27/2021.   Daughter lives with her   Right handed   Immunization History  Administered Date(s) Administered   Influenza Split 09/02/2013   Moderna SARS-COV2 Booster Vaccination 09/06/2020, 05/17/2021   Moderna Sars-Covid-2 Vaccination 11/13/2019, 12/11/2019   PNEUMOCOCCAL CONJUGATE-20 05/13/2023   Pneumococcal Polysaccharide-23 01/02/2021   Td 01/17/2010   Tdap 04/06/2018   Zoster Recombinant(Shingrix) 09/18/2020, 01/03/2021     Objective: Vital Signs: BP 121/71 (BP Location: Left Arm,  Patient Position: Sitting, Cuff Size: Normal)   Pulse 91   Resp 11   Ht 5' 5.75" (1.67 m)   Wt 128 lb (58.1 kg)   BMI 20.82 kg/m    Physical Exam Eyes:     Conjunctiva/sclera: Conjunctivae normal.  Cardiovascular:     Rate and Rhythm: Normal rate and regular rhythm.  Pulmonary:     Effort: Pulmonary effort is normal.     Breath sounds: Normal breath sounds.  Musculoskeletal:     Right lower leg: No edema.     Left lower leg: No edema.  Lymphadenopathy:     Cervical: No cervical adenopathy.  Skin:    General: Skin is warm and dry.     Findings: No rash.  Neurological:     Mental Status: She is alert.  Psychiatric:        Mood and Affect: Mood normal.      Musculoskeletal Exam:  Shoulders full ROM no tenderness or swelling Elbows full ROM no tenderness or swelling Wrists full ROM no tenderness or swelling Extensive Heberden's nodes throughout fingers on both hands, medial deviation of second DIPs and lateral deviation of third PIP and DIPs, advanced squaring of left first CMC joint with nonreducible MCP subluxation and hyperextension Knees full ROM no tenderness or swelling Ankles full ROM no tenderness or swelling   Investigation: No additional findings.  Imaging: No results found.  Recent Labs: Lab Results  Component Value Date   WBC 4.4 05/13/2023   HGB 12.4 05/13/2023   PLT 266 05/13/2023   NA 141 05/13/2023   K 4.1 05/13/2023   CL 102 05/13/2023   CO2 26 05/13/2023   GLUCOSE 89 05/13/2023   BUN 9 05/13/2023   CREATININE 0.63 05/13/2023   BILITOT 0.3 05/13/2023   ALKPHOS 40 (L) 05/13/2023   AST 27 05/13/2023   ALT 21 05/13/2023   PROT 6.8 05/13/2023   ALBUMIN 4.3 05/13/2023   CALCIUM 10.0 05/13/2023    Speciality Comments: PLQ Eye Exam 03/11/2023 Normal [facility] f/u 12 months.  Procedures:  No procedures performed Allergies: Other and Shellfish allergy   Assessment / Plan:     Visit Diagnoses: Erosive osteoarthritis of both hands  - turmeric supplement is also on Lyrica and may be helping for joint pain. - Plan: Sedimentation rate, C-reactive protein  Erosive Osteoarthritis Increased pain and stiffness in hands, particularly in fingers and wrists, over the past month and a half. Noted progression of deformities in fingers. Discussed the natural course of the disease and the potential for decreased inflammation but increased deformity over  time. -Draw labs to check inflammatory markers. -If abnormal can consider trial of DMARD Rx again -Potential trial of low-dose Prednisone for episodic flare-ups, pending lab results and patient's tolerance.  Peripheral Neuropathy Patient reports decreased sensation in fingertips and occasional dropping of objects. History of carpal tunnel syndrome and tennocele. -Refer to Occupational Therapy for evaluation and potential intervention strategies.  Digital Mucinous Cysts Noted on left 5th digit, likely related to underlying osteoarthritis. -No specific intervention at this time.   Osteoporosis Patient stopped taking Raloxifene approximately 3-4 months ago. On reclast infusion waiting to recheck DEXA for treatment response.   Orders: Orders Placed This Encounter  Procedures   Sedimentation rate   C-reactive protein   No orders of the defined types were placed in this encounter.    Follow-Up Instructions: Return in about 6 months (around 03/26/2024) for EOH/OP f/u 6mos.   Fuller Plan, MD  Note - This record has been created using [employer].  Chart creation errors have been sought, but may not always  have been located. Such creation errors do not reflect on  the standard of medical care.

## 2023-09-27 ENCOUNTER — Encounter: Payer: 59 | Attending: Internal Medicine | Admitting: Internal Medicine

## 2023-09-27 ENCOUNTER — Encounter: Payer: Self-pay | Admitting: Internal Medicine

## 2023-09-27 VITALS — BP 121/71 | HR 91 | Resp 11 | Ht 65.75 in | Wt 128.0 lb

## 2023-09-27 DIAGNOSIS — E559 Vitamin D deficiency, unspecified: Secondary | ICD-10-CM

## 2023-09-27 DIAGNOSIS — M154 Erosive (osteo)arthritis: Secondary | ICD-10-CM

## 2023-09-27 DIAGNOSIS — M810 Age-related osteoporosis without current pathological fracture: Secondary | ICD-10-CM

## 2023-09-27 NOTE — Patient Instructions (Signed)
I will refer you to see occupational therapy about your hand pain and functional symptoms with dropping items and progressive ROM decrease.  For osteoarthritis several treatments may be beneficial:  - Topical antiinflammatory medicine such as diclofenac or Voltaren can be applied to  affected area as needed. Topical analgesics containing CBD, menthol, or lidocaine can be tried.  - Oral nonsteroidal antiinflammatory drugs (NSAIDs) such as ibuprofen, aleve, celebrex, or mobic are usually helpful for osteoarthritis. These should be taken intermittently or as needed, and always taken with food.  - Turmeric has some antiinflammatory effect similar to NSAIDs and may help, if taken as a supplement should not be taken above recommended doses.   - Compressive gloves or sleeve can be helpful to support the joint especially if hurting or swelling with certain activities.  - Physical therapy referral can discuss exercises or activity modification to improve symptoms or strength if needed.  - Local steroid injection is an option if symptoms become worse and not controlled by the above options.

## 2023-09-28 LAB — C-REACTIVE PROTEIN: CRP: <3 mg/L (ref <8.0)

## 2023-09-28 LAB — SEDIMENTATION RATE: Sed Rate: 6 mm/h (ref 0–30)

## 2023-10-03 DIAGNOSIS — G4733 Obstructive sleep apnea (adult) (pediatric): Secondary | ICD-10-CM | POA: Diagnosis not present

## 2023-10-07 DIAGNOSIS — M79675 Pain in left toe(s): Secondary | ICD-10-CM | POA: Diagnosis not present

## 2023-10-07 DIAGNOSIS — L110 Acquired keratosis follicularis: Secondary | ICD-10-CM | POA: Diagnosis not present

## 2023-10-07 DIAGNOSIS — L609 Nail disorder, unspecified: Secondary | ICD-10-CM | POA: Diagnosis not present

## 2023-10-07 DIAGNOSIS — L600 Ingrowing nail: Secondary | ICD-10-CM | POA: Diagnosis not present

## 2023-10-11 DIAGNOSIS — M546 Pain in thoracic spine: Secondary | ICD-10-CM | POA: Diagnosis not present

## 2023-10-11 DIAGNOSIS — M9903 Segmental and somatic dysfunction of lumbar region: Secondary | ICD-10-CM | POA: Diagnosis not present

## 2023-10-11 DIAGNOSIS — M542 Cervicalgia: Secondary | ICD-10-CM | POA: Diagnosis not present

## 2023-10-11 DIAGNOSIS — M9901 Segmental and somatic dysfunction of cervical region: Secondary | ICD-10-CM | POA: Diagnosis not present

## 2023-10-11 DIAGNOSIS — M9902 Segmental and somatic dysfunction of thoracic region: Secondary | ICD-10-CM | POA: Diagnosis not present

## 2023-10-16 DIAGNOSIS — G4733 Obstructive sleep apnea (adult) (pediatric): Secondary | ICD-10-CM | POA: Diagnosis not present

## 2023-10-18 ENCOUNTER — Telehealth: Payer: Self-pay | Admitting: *Deleted

## 2023-10-18 NOTE — Telephone Encounter (Signed)
Patient called regarding labs results. Patient stated you were going to start her on medication for her erosive OA after test. Looking at the note, I think she is talking about her DEXA; however, I don't see one ordered. Please advise.

## 2023-10-19 DIAGNOSIS — G4733 Obstructive sleep apnea (adult) (pediatric): Secondary | ICD-10-CM | POA: Diagnosis not present

## 2023-11-04 ENCOUNTER — Telehealth: Payer: Self-pay | Admitting: *Deleted

## 2023-11-04 DIAGNOSIS — M154 Erosive (osteo)arthritis: Secondary | ICD-10-CM

## 2023-11-04 NOTE — Telephone Encounter (Signed)
 Patient contacted the office to follow up on lab results. Patient states she had discussed at her office visit the possibility of needing to go on medication for the increased pain she has been having. Patient states that it has not improved. Patient would like to know her lab results and would also like to know what the follow up will be be. Please advise.

## 2023-11-10 DIAGNOSIS — M546 Pain in thoracic spine: Secondary | ICD-10-CM | POA: Diagnosis not present

## 2023-11-10 DIAGNOSIS — G4733 Obstructive sleep apnea (adult) (pediatric): Secondary | ICD-10-CM | POA: Diagnosis not present

## 2023-11-10 DIAGNOSIS — M542 Cervicalgia: Secondary | ICD-10-CM | POA: Diagnosis not present

## 2023-11-10 DIAGNOSIS — M9902 Segmental and somatic dysfunction of thoracic region: Secondary | ICD-10-CM | POA: Diagnosis not present

## 2023-11-10 DIAGNOSIS — M9901 Segmental and somatic dysfunction of cervical region: Secondary | ICD-10-CM | POA: Diagnosis not present

## 2023-11-10 DIAGNOSIS — M9903 Segmental and somatic dysfunction of lumbar region: Secondary | ICD-10-CM | POA: Diagnosis not present

## 2023-11-11 ENCOUNTER — Ambulatory Visit (INDEPENDENT_AMBULATORY_CARE_PROVIDER_SITE_OTHER): Payer: 59 | Admitting: Family Medicine

## 2023-11-11 ENCOUNTER — Encounter: Payer: Self-pay | Admitting: Family Medicine

## 2023-11-11 VITALS — BP 109/59 | HR 77 | Temp 97.7°F | Ht 65.75 in | Wt 129.2 lb

## 2023-11-11 DIAGNOSIS — J4520 Mild intermittent asthma, uncomplicated: Secondary | ICD-10-CM | POA: Diagnosis not present

## 2023-11-11 DIAGNOSIS — I480 Paroxysmal atrial fibrillation: Secondary | ICD-10-CM

## 2023-11-11 DIAGNOSIS — M810 Age-related osteoporosis without current pathological fracture: Secondary | ICD-10-CM | POA: Diagnosis not present

## 2023-11-11 DIAGNOSIS — G629 Polyneuropathy, unspecified: Secondary | ICD-10-CM | POA: Diagnosis not present

## 2023-11-11 DIAGNOSIS — Z79899 Other long term (current) drug therapy: Secondary | ICD-10-CM | POA: Diagnosis not present

## 2023-11-11 DIAGNOSIS — K219 Gastro-esophageal reflux disease without esophagitis: Secondary | ICD-10-CM

## 2023-11-11 DIAGNOSIS — E559 Vitamin D deficiency, unspecified: Secondary | ICD-10-CM | POA: Diagnosis not present

## 2023-11-11 IMAGING — MG MM DIGITAL SCREENING BILAT W/ TOMO AND CAD
8 series · 9 of 24 positions shown · non-contrast
Comparison: Previous exam(s).

CLINICAL DATA: Screening.

EXAM:
DIGITAL SCREENING BILATERAL MAMMOGRAM WITH TOMOSYNTHESIS AND CAD
TECHNIQUE: Bilateral screening digital craniocaudal and mediolateral oblique
mammograms were obtained. Bilateral screening digital breast
tomosynthesis was performed. The images were evaluated with
computer-aided detection.

[R MLO synth-2D]
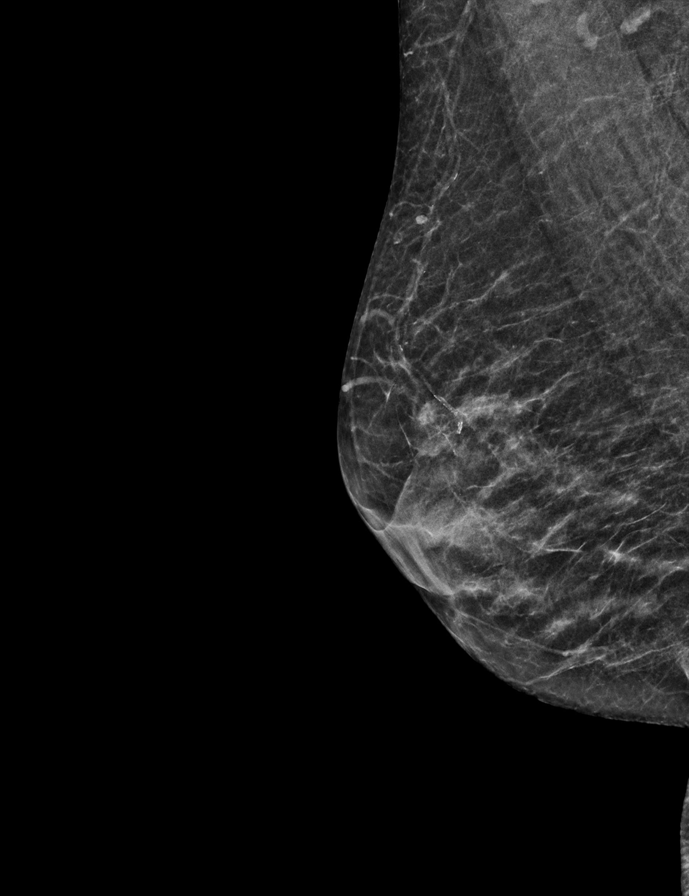

[L CC synth-2D]
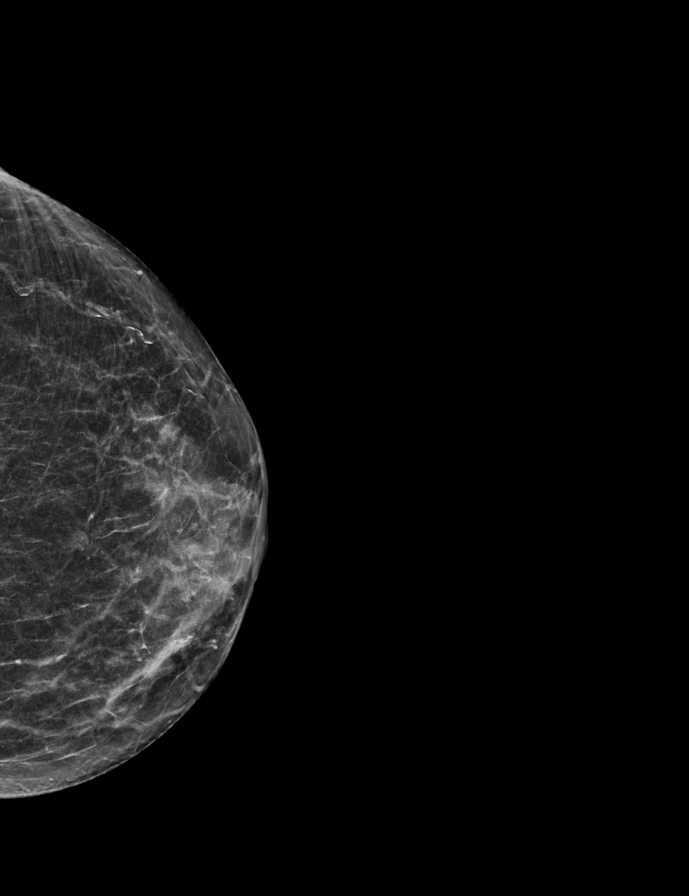

[L MLO synth-2D]
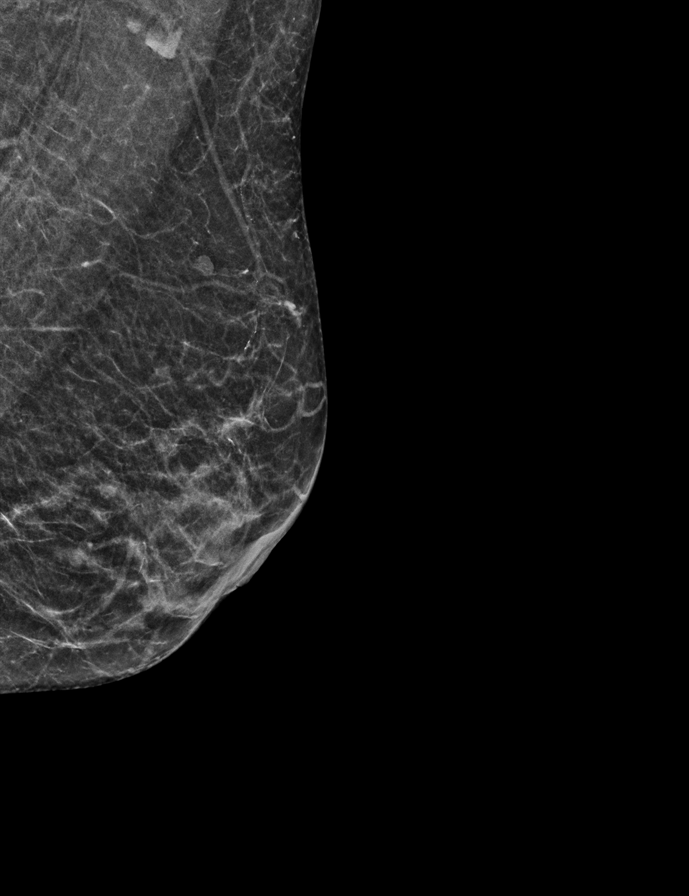

[R CC synth-2D]
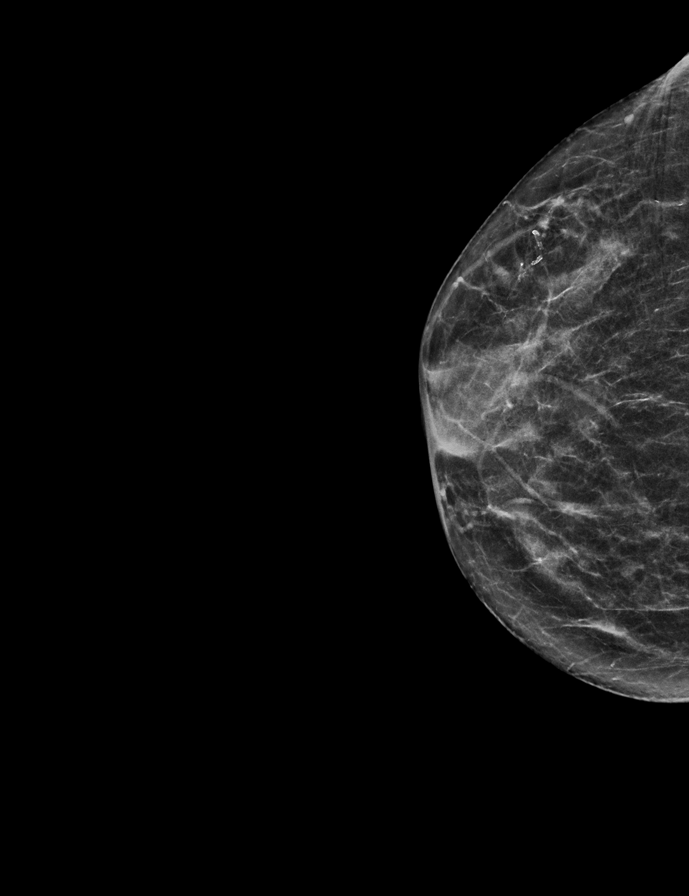

[L CC tomo · 2 of 46 frames shown]
[frame 15/46]
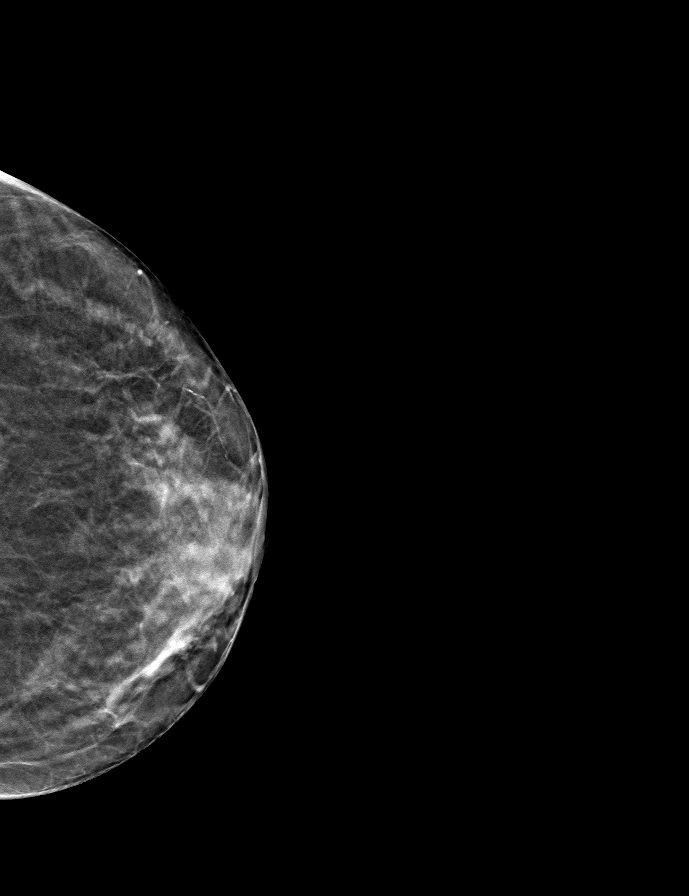
[frame 23/46]
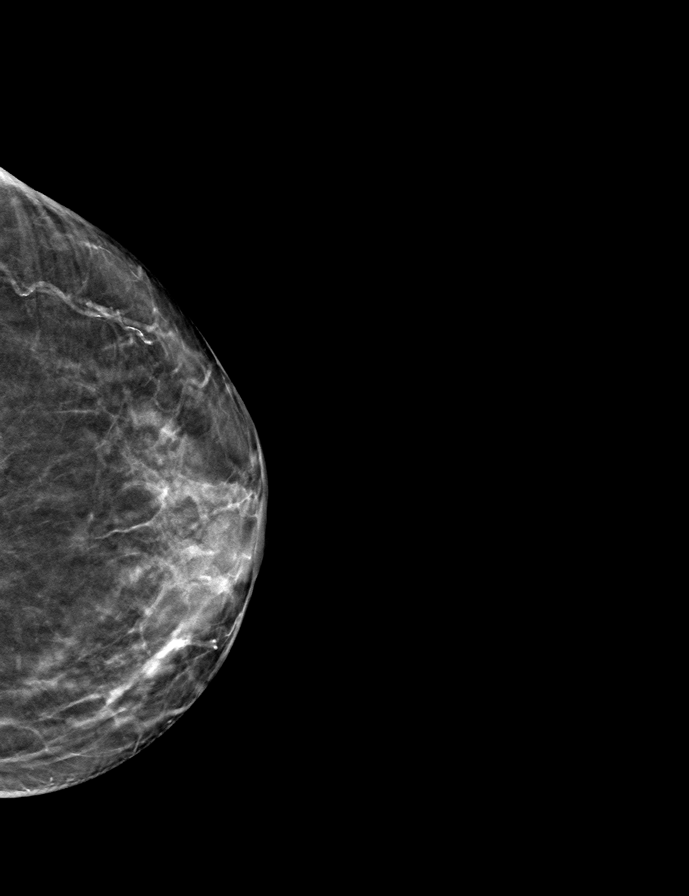

[L MLO tomo · tomo slice 22/43.0]
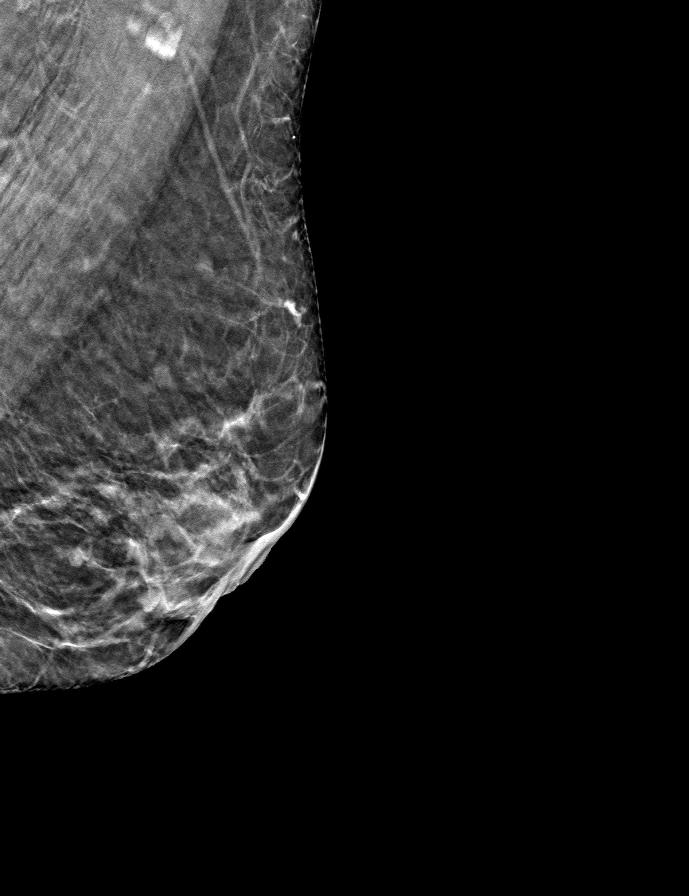

[R MLO tomo · tomo slice 22/43.0]
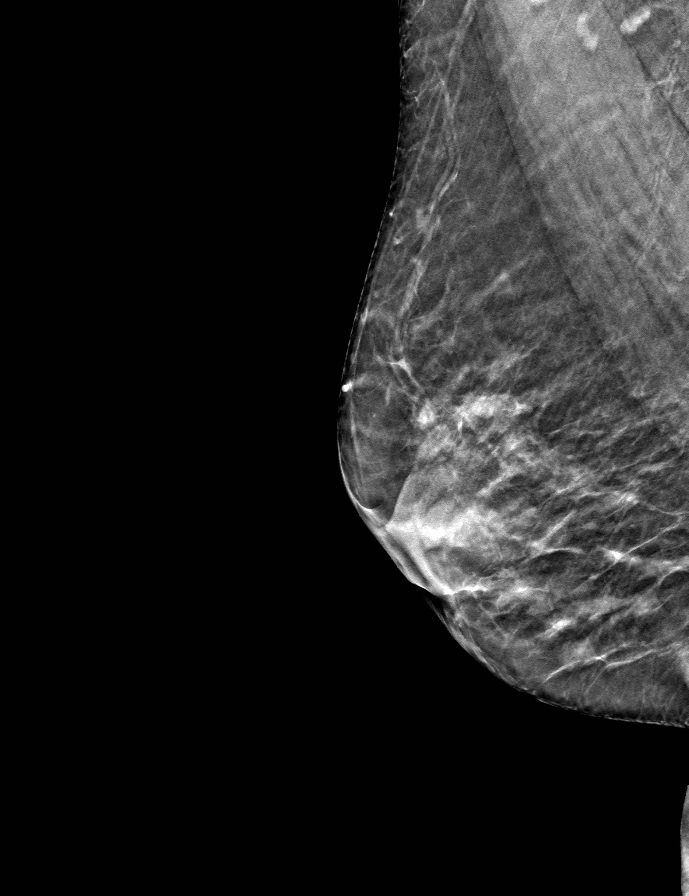

[R CC tomo · tomo slice 23/45.0]
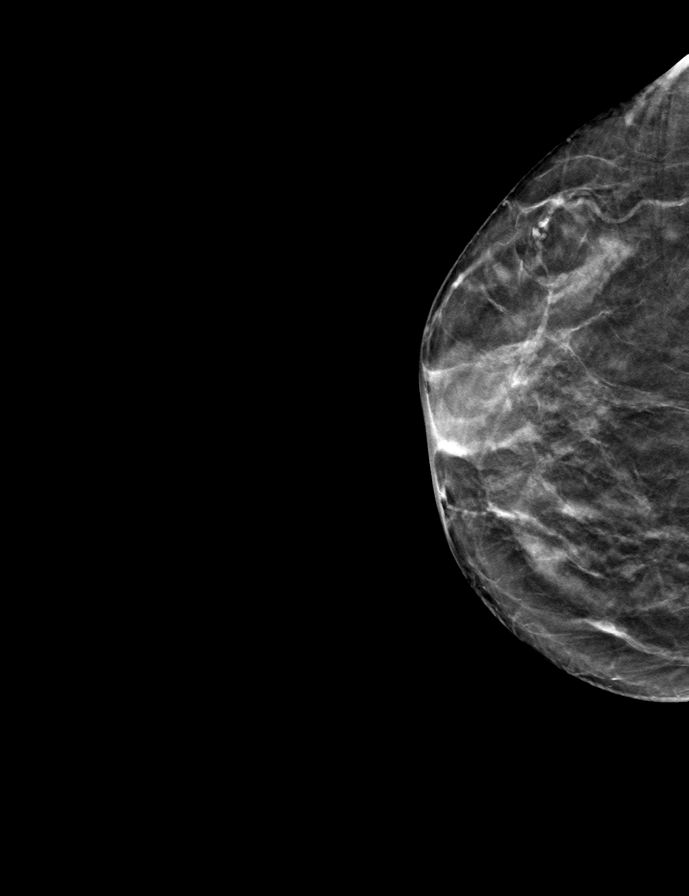

[9 of 24 positions shown; findings below may reference images not displayed]

ACR Breast Density Category b: There are scattered areas of
fibroglandular density.
FINDINGS: In the left breast, a possible asymmetry warrants further
evaluation. In the right breast, no findings suspicious for
malignancy.
IMPRESSION: Further evaluation is suggested for possible asymmetry in the left
breast.

RECOMMENDATION:
Diagnostic mammogram and possibly ultrasound of the left breast.
(Code:[id number])

The patient will be contacted regarding the findings, and additional
imaging will be scheduled.

BI-RADS CATEGORY  0: Incomplete. Need additional imaging evaluation
and/or prior mammograms for comparison.

## 2023-11-11 MED ORDER — PREGABALIN 75 MG PO CAPS: 75.0000 mg | ORAL_CAPSULE | Freq: Two times a day (BID) | ORAL | 5 refills | Status: DC

## 2023-11-11 NOTE — Progress Notes (Signed)
 Subjective:  Patient ID: Kristin Wallace, female    DOB: [birth year], 68 y.o.   MRN: [id number]  Patient Care Team: Severa Rock HERO, FNP as PCP - General (Family Medicine)   Chief Complaint:  Medical Management of Chronic Issues (6 month follow up )   HPI: Kristin Wallace is a 68 y.o. female presenting on 11/11/2023 for Medical Management of Chronic Issues (6 month follow up )   Discussed the use of AI scribe software for clinical note transcription with the patient, who gave verbal consent to proceed.  History of Present Illness   The patient, with a history of neuropathy and erosive osteoarthritis, presents for a follow-up visit. They report a significant improvement in neuropathic symptoms after starting Magtech three weeks ago. However, due to financial constraints, they had to reduce the dosage from three to one per night, which led to a resurgence of symptoms, including electrical shocks in the toes, feet, and legs. Upon resuming the original dosage, the symptoms improved.  The patient also reports worsening hand pain, which they attribute to their erosive osteoarthritis. They have noticed a decrease in feeling in their fingertips, leading to occasional difficulty in holding objects. They have been managing their arthritis with a regimen of cinnamon and honey, which has improved their hand mobility despite the visible deformities. However, they express concern about the increasing pain, especially in cold weather, and are considering restarting arthritis medication.  Their asthma has been worse recently, which they believe is due to the cold weather. They have been managing it with one puff of Texacort per day, occasionally taking an additional puff at night if needed.  The patient is also on Lyrica  for neuropathy, which they take twice a day. They report experiencing drowsiness, fatigue, and brain fog, but it is unclear if these symptoms are due to the medication or their age and history of  concussions. They also report taking calcium  and vitamin D  supplements, and have not had any recent fractures. They are due for a Reclast  infusion in March for osteoporosis management.          Relevant past medical, surgical, family, and social history reviewed and updated as indicated.  Allergies and medications reviewed and updated. Data reviewed: Chart in Epic.   Past Medical History:  Diagnosis Date   Arthritis    erosive osteoarthritis   Asthma    Atrial fibrillation (HCC)    Chronically dry eyes    Fatigue    chronic ( and weakness)   GERD (gastroesophageal reflux disease)    Lyme disease    Neuromuscular disorder (HCC)    neuropathy   Neuropathy    legs and feet   Osteoporosis    Uses continuous positive airway pressure (CPAP) ventilation at home     Past Surgical History:  Procedure Laterality Date   ABDOMINAL HYSTERECTOMY     BREAST SURGERY Left    neg tumor   COLONOSCOPY     COLONOSCOPY WITH PROPOFOL  N/A 07/07/2022   Procedure: COLONOSCOPY WITH PROPOFOL ;  Surgeon: Cindie Carlin POUR, DO;  Location: [facility];  Service: Endoscopy;  Laterality: N/A;  1:15pm, ASA 2, per office pt can't move up   ESOPHAGOGASTRODUODENOSCOPY     POLYPECTOMY  07/07/2022   Procedure: POLYPECTOMY;  Surgeon: Cindie Carlin POUR, DO;  Location: [facility];  Service: Endoscopy;;   RIGHT AND LEFT HEART CATH     TUBAL LIGATION      Social History   Socioeconomic History  Marital status: Divorced    Spouse name: Not on file   Number of children: 3   Years of education: Not on file   Highest education level: GED or equivalent  Occupational History   Occupation: retired  Tobacco Use   Smoking status: Never    Passive exposure: Never   Smokeless tobacco: Never  Vaping Use   Vaping status: Never Used  Substance and Sexual Activity   Alcohol use: Yes    Comment: 3 yearly   Drug use: Never   Sexual activity: Not on file  Other Topics Concern   Not on file  Social History  Narrative   Moved here from [place]  this year 07/27/2021.   Daughter lives with her   Right handed   Social Drivers of Health   Financial Resource Strain: High Risk (11/04/2023)   Overall Financial Resource Strain (CARDIA)    Difficulty of Paying Living Expenses: Hard  Food Insecurity: Food Insecurity Present (11/04/2023)   Hunger Vital Sign    Worried About Running Out of Food in the Last Year: Sometimes true    Ran Out of Food in the Last Year: Sometimes true  Transportation Needs: No Transportation Needs (11/04/2023)   PRAPARE - Administrator, Civil Service (Medical): No    Lack of Transportation (Non-Medical): No  Physical Activity: Sufficiently Active (11/04/2023)   Exercise Vital Sign    Days of Exercise per Week: 7 days    Minutes of Exercise per Session: 30 min  Stress: Stress Concern Present (11/04/2023)   [employer] of Occupational Health - Occupational Stress Questionnaire    Feeling of Stress : To some extent  Social Connections: Moderately Integrated (11/04/2023)   Social Connection and Isolation Panel [NHANES]    Frequency of Communication with Friends and Family: Twice a week    Frequency of Social Gatherings with Friends and Family: More than three times a week    Attends Religious Services: More than 4 times per year    Active Member of [employer] or Organizations: Yes    Attends Engineer, Structural: More than 4 times per year    Marital Status: Divorced  Intimate Partner Violence: Not At Risk (11/26/2022)   Humiliation, Afraid, Rape, and Kick questionnaire    Fear of Current or Ex-Partner: No    Emotionally Abused: No    Physically Abused: No    Sexually Abused: No    Outpatient Encounter Medications as of 11/11/2023  Medication Sig   albuterol  (VENTOLIN  HFA) 108 (90 Base) MCG/ACT inhaler Inhale into the lungs.   Ascorbic Acid (VITAMIN C) 1000 MG tablet Take 1,000 mg by mouth in the morning.   budesonide -formoterol  (SYMBICORT ) 160-4.5 MCG/ACT  inhaler Inhale 2 puffs into the lungs 2 (two) times daily.   Calcium  Carb-Cholecalciferol (CALCIUM  600+D3 PO) Take 1 tablet by mouth every morning.   carboxymethylcellul-glycerin (OPTIVE) 0.5-0.9 % ophthalmic solution Place 1 drop into both eyes 2 (two) times daily as needed for dry eyes.   Cholecalciferol (VITAMIN D3) 125 MCG (5000 UT) CAPS Take 5,000 Units by mouth in the morning.   CINNAMON PO Take 1 Dose by mouth See admin instructions. 1 teaspoonful twice daily with 2 teaspoonful of honey   cycloSPORINE (RESTASIS) 0.05 % ophthalmic emulsion Place 2 drops into both eyes 2 (two) times daily.   Multiple Vitamins-Minerals (MULTIVITAMIN WOMEN 50+) TABS Take 1 tablet by mouth in the morning.   pantoprazole  (PROTONIX ) 40 MG tablet TAKE 1 TABLET BY MOUTH DAILY  PSYLLIUM HUSK PO Take 1 Dose by mouth in the morning.   SODIUM FLUORIDE 5000 PPM 1.1 % PSTE Take by mouth.   TURMERIC CURCUMIN PO Take 1 capsule by mouth in the morning and at bedtime. W/Ginger Powder   zoledronic  acid (RECLAST ) 5 MG/100ML SOLN injection Inject into the vein.   pregabalin  (LYRICA ) 75 MG capsule Take 1 capsule (75 mg total) by mouth 2 (two) times daily.   [DISCONTINUED] pregabalin  (LYRICA ) 75 MG capsule Take 1 capsule (75 mg total) by mouth 2 (two) times daily.   No facility-administered encounter medications on file as of 11/11/2023.    Allergies  Allergen Reactions   Other Shortness Of Breath    Dairy   Shellfish Allergy Other (See Comments) and Nausea And Vomiting    Vomiting    Pertinent ROS per HPI, otherwise unremarkable      Objective:  BP (!) 109/59   Pulse 77   Temp 97.7 F (36.5 C)   Ht 5' 5.75 (1.67 m)   Wt 129 lb 3.2 oz (58.6 kg)   SpO2 98%   BMI 21.01 kg/m    Wt Readings from Last 3 Encounters:  11/11/23 129 lb 3.2 oz (58.6 kg)  09/27/23 128 lb (58.1 kg)  07/02/23 124 lb 12.8 oz (56.6 kg)    Physical Exam Vitals and nursing note reviewed.  Constitutional:      General: She is not in  acute distress.    Appearance: Normal appearance. She is well-developed, well-groomed and normal weight. She is not ill-appearing, toxic-appearing or diaphoretic.  HENT:     Head: Normocephalic and atraumatic.     Jaw: There is normal jaw occlusion.     Right Ear: Hearing normal.     Left Ear: Hearing normal.     Nose: Nose normal.     Mouth/Throat:     Lips: Pink.     Mouth: Mucous membranes are moist.     Pharynx: Oropharynx is clear. Uvula midline.  Eyes:     General: Lids are normal.     Extraocular Movements: Extraocular movements intact.     Conjunctiva/sclera: Conjunctivae normal.     Pupils: Pupils are equal, round, and reactive to light.  Neck:     Thyroid : No thyroid  mass, thyromegaly or thyroid  tenderness.     Vascular: No carotid bruit or JVD.     Trachea: Trachea and phonation normal.  Cardiovascular:     Rate and Rhythm: Normal rate and regular rhythm.     Chest Wall: PMI is not displaced.     Pulses: Normal pulses.     Heart sounds: Normal heart sounds. No murmur heard.    No friction rub. No gallop.  Pulmonary:     Effort: Pulmonary effort is normal. No respiratory distress.     Breath sounds: Normal breath sounds. No wheezing.  Abdominal:     General: Bowel sounds are normal. There is no distension or abdominal bruit.     Palpations: Abdomen is soft. There is no hepatomegaly or splenomegaly.     Tenderness: There is no abdominal tenderness. There is no right CVA tenderness or left CVA tenderness.     Hernia: No hernia is present.  Musculoskeletal:     Cervical back: Normal range of motion and neck supple.     Right lower leg: No edema.     Left lower leg: No edema.     Comments: Arthritic changes to bilateral hands with decreased ROM.   Lymphadenopathy:  Cervical: No cervical adenopathy.  Skin:    General: Skin is warm and dry.     Capillary Refill: Capillary refill takes less than 2 seconds.     Coloration: Skin is not cyanotic, jaundiced or pale.      Findings: No rash.  Neurological:     General: No focal deficit present.     Mental Status: She is alert and oriented to person, place, and time.     Sensory: Sensation is intact.     Motor: Motor function is intact.     Coordination: Coordination is intact.     Gait: Gait is intact.     Deep Tendon Reflexes: Reflexes are normal and symmetric.  Psychiatric:        Attention and Perception: Attention and perception normal.        Mood and Affect: Mood and affect normal.        Speech: Speech normal.        Behavior: Behavior normal. Behavior is cooperative.        Thought Content: Thought content normal.        Cognition and Memory: Cognition and memory normal.        Judgment: Judgment normal.      Results for orders placed or performed in visit on 09/27/23  Sedimentation rate   Collection Time: 09/27/23  3:03 PM  Result Value Ref Range   Sed Rate 6 0 - 30 mm/h  C-reactive protein   Collection Time: 09/27/23  3:03 PM  Result Value Ref Range   CRP <3.0 <8.0 mg/L       Pertinent labs & imaging results that were available during my care of the patient were reviewed by me and considered in my medical decision making.  Assessment & Plan:  Maryfer was seen today for medical management of chronic issues.  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 -     ToxASSURE Select 13 (MW), Urine -     CMP14+EGFR -     CBC with Differential/Platelet  Controlled substance agreement signed PDMP reviewed, no red flags present.  -     pregabalin  (LYRICA ) 75 MG capsule; Take 1 capsule (75 mg total) by mouth 2 (two) times daily. -     ToxASSURE Select 13 (MW), Urine  Gastroesophageal reflux disease without esophagitis -     CBC with Differential/Platelet  Paroxysmal atrial fibrillation (HCC) -     CMP14+EGFR -     CBC with  Differential/Platelet -     Thyroid  Panel With TSH  Vitamin D  deficiency -     CMP14+EGFR -     VITAMIN D  25 Hydroxy (Vit-D Deficiency, Fractures)  Mild intermittent asthma without complication Well controlled   Age-related osteoporosis without current pathological fracture -     CMP14+EGFR -     VITAMIN D  25 Hydroxy (Vit-D Deficiency, Fractures)     Assessment and Plan    Peripheral Neuropathy Peripheral neuropathy with symptoms of electrical shocks in toes, feet, and legs, particularly at night. Symptoms improved with magnesium supplementation (Magtec) but worsened when dosage was reduced. Neuropathy also present in hands, confirmed by rheumatologist. Patient prefers to minimize medication use and has financial constraints affecting medication adherence. 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 Pain exacerbated by cold weather. Patient uses cinnamon and honey for symptom relief. Awaiting follow-up from rheumatologist regarding blood work and potential restart of DMARD therapy. Patient prefers to minimize medication use and is interested in occupational therapy for grip strength improvement. - Follow up with rheumatologist on blood work and potential DMARD therapy - Refer to occupational therapy for grip strength and hand function improvement - Continue cinnamon and honey for symptom relief - Use turmeric with food and black pepper for anti-inflammatory benefits - Switch to plastic or wooden spoon for honey to preserve its properties  Asthma Asthma exacerbated by cold weather. Managed with Texacort inhaler, one puff daily, with occasional need for an additional puff at night. Symptoms are mostly controlled by avoiding triggers such as diet and smells. - Continue  Texacort inhaler, one puff daily - Use additional puff at night if needed - Monitor and adjust treatment as necessary  General Health Maintenance Patient is taking calcium  and vitamin D  supplements. No recent fractures reported. - Continue calcium  and vitamin D  supplements  Follow-up - Follow up with rheumatologist on blood work and potential DMARD therapy - Follow up on occupational therapy referral.          Continue all other maintenance medications.  Follow up plan: Return in about 6 months (around 05/10/2024) for chronic follow up .   Continue healthy lifestyle choices, including diet (rich in fruits, vegetables, and lean proteins, and low in salt and simple carbohydrates) and exercise (at least 30 minutes of moderate physical activity daily).  Educational handout given for OA  The above assessment and management plan was discussed with the patient. The patient verbalized understanding of and has agreed to the management plan. Patient is aware to call the clinic if they develop any new symptoms or if symptoms persist or worsen. Patient is aware when to return to the clinic for a follow-up visit. Patient educated on when it is appropriate to go to the emergency department.   Rosaline Bruns, FNP-C [facility] [phone]

## 2023-11-12 LAB — CMP14+EGFR
ALT: 13 [IU]/L (ref 0–32)
AST: 17 [IU]/L (ref 0–40)
Albumin: 4.5 g/dL (ref 3.9–4.9)
Alkaline Phosphatase: 53 [IU]/L (ref 44–121)
BUN/Creatinine Ratio: 15 (ref 12–28)
BUN: 9 mg/dL (ref 8–27)
Bilirubin Total: 0.3 mg/dL (ref 0.0–1.2)
CO2: 24 mmol/L (ref 20–29)
Calcium: 9.5 mg/dL (ref 8.7–10.3)
Chloride: 101 mmol/L (ref 96–106)
Creatinine, Ser: 0.61 mg/dL (ref 0.57–1.00)
Globulin, Total: 2.2 g/dL (ref 1.5–4.5)
Glucose: 99 mg/dL (ref 70–99)
Potassium: 3.9 mmol/L (ref 3.5–5.2)
Sodium: 140 mmol/L (ref 134–144)
Total Protein: 6.7 g/dL (ref 6.0–8.5)
eGFR: 97 mL/min/{1.73_m2} (ref >59)

## 2023-11-12 LAB — CBC WITH DIFFERENTIAL/PLATELET
Basophils Absolute: 0 10*3/uL (ref 0.0–0.2)
Basos: 1 %
EOS (ABSOLUTE): 0.2 10*3/uL (ref 0.0–0.4)
Eos: 4 %
Hematocrit: 36.1 % (ref 34.0–46.6)
Hemoglobin: 11.9 g/dL (ref 11.1–15.9)
Immature Grans (Abs): 0 10*3/uL (ref 0.0–0.1)
Immature Granulocytes: 0 %
Lymphocytes Absolute: 1.3 10*3/uL (ref 0.7–3.1)
Lymphs: 32 %
MCH: 29.7 pg (ref 26.6–33.0)
MCHC: 33 g/dL (ref 31.5–35.7)
MCV: 90 fL (ref 79–97)
Monocytes Absolute: 0.7 10*3/uL (ref 0.1–0.9)
Monocytes: 16 %
Neutrophils Absolute: 1.9 10*3/uL (ref 1.4–7.0)
Neutrophils: 47 %
Platelets: 310 10*3/uL (ref 150–450)
RBC: 4.01 x10E6/uL (ref 3.77–5.28)
RDW: 12.7 % (ref 11.7–15.4)
WBC: 4.1 10*3/uL (ref 3.4–10.8)

## 2023-11-12 LAB — VITAMIN D 25 HYDROXY (VIT D DEFICIENCY, FRACTURES): Vit D, 25-Hydroxy: 86.5 ng/mL (ref 30.0–100.0)

## 2023-11-12 LAB — THYROID PANEL WITH TSH
Free Thyroxine Index: 2.1 (ref 1.2–4.9)
T3 Uptake Ratio: 25 % (ref 24–39)
T4, Total: 8.2 ug/dL (ref 4.5–12.0)
TSH: 1.71 u[IU]/mL (ref 0.450–4.500)

## 2023-11-15 DIAGNOSIS — M79675 Pain in left toe(s): Secondary | ICD-10-CM | POA: Diagnosis not present

## 2023-11-15 DIAGNOSIS — M79672 Pain in left foot: Secondary | ICD-10-CM | POA: Diagnosis not present

## 2023-11-15 DIAGNOSIS — L600 Ingrowing nail: Secondary | ICD-10-CM | POA: Diagnosis not present

## 2023-11-15 DIAGNOSIS — L03032 Cellulitis of left toe: Secondary | ICD-10-CM | POA: Diagnosis not present

## 2023-11-15 NOTE — Telephone Encounter (Signed)
 Patient contacted the office regarding her lab results and possible restarting of medication. Patient states this is the third time she has called to follow up. Patient states the pain in her left hand is worse than before. Patient would like to know what her lab results are and whether you recommend any medication for her. She also states you mentioned physical therapy for grip strengthening and would like to have that order placed as well.

## 2023-11-16 ENCOUNTER — Encounter: Payer: Self-pay | Admitting: Internal Medicine

## 2023-11-16 ENCOUNTER — Encounter: Payer: Self-pay | Admitting: Family Medicine

## 2023-11-16 DIAGNOSIS — G4733 Obstructive sleep apnea (adult) (pediatric): Secondary | ICD-10-CM | POA: Diagnosis not present

## 2023-11-16 LAB — TOXASSURE SELECT 13 (MW), URINE

## 2023-11-16 MED ORDER — CELECOXIB 100 MG PO CAPS: 100.0000 mg | ORAL_CAPSULE | Freq: Every day | ORAL | 2 refills | Status: DC | PRN

## 2023-11-16 NOTE — Addendum Note (Signed)
 Addended by: Fuller Plan on: 11/16/2023 01:05 PM   Modules accepted: Orders

## 2023-11-18 ENCOUNTER — Other Ambulatory Visit: Payer: Self-pay | Admitting: Internal Medicine

## 2023-11-19 ENCOUNTER — Telehealth: Payer: Self-pay

## 2023-11-19 DIAGNOSIS — G4733 Obstructive sleep apnea (adult) (pediatric): Secondary | ICD-10-CM | POA: Diagnosis not present

## 2023-11-19 NOTE — Telephone Encounter (Signed)
Dr. Dimple Casey, patient will be scheduled as soon as possible.  Auth Submission: NO AUTH NEEDED Site of care: Site of care: [facility] Payer: [employer] dual complete Medication & CPT/J Code(s) submitted: Reclast (Zolendronic acid) [id number] Route of submission (phone, fax, portal):  Phone # Fax # Auth type: Buy/Bill HB Units/visits requested: 5mg  x 1 dose Reference number:  Approval from: 11/19/23 to 11/01/24

## 2023-11-23 IMAGING — DX DG CHEST 2V
2 series · 2 of 2 positions shown · non-contrast
Comparison: 10/02/2021

CLINICAL DATA: Dyspnea on exertion.

EXAM:
CHEST - 2 VIEW

[chest pa]
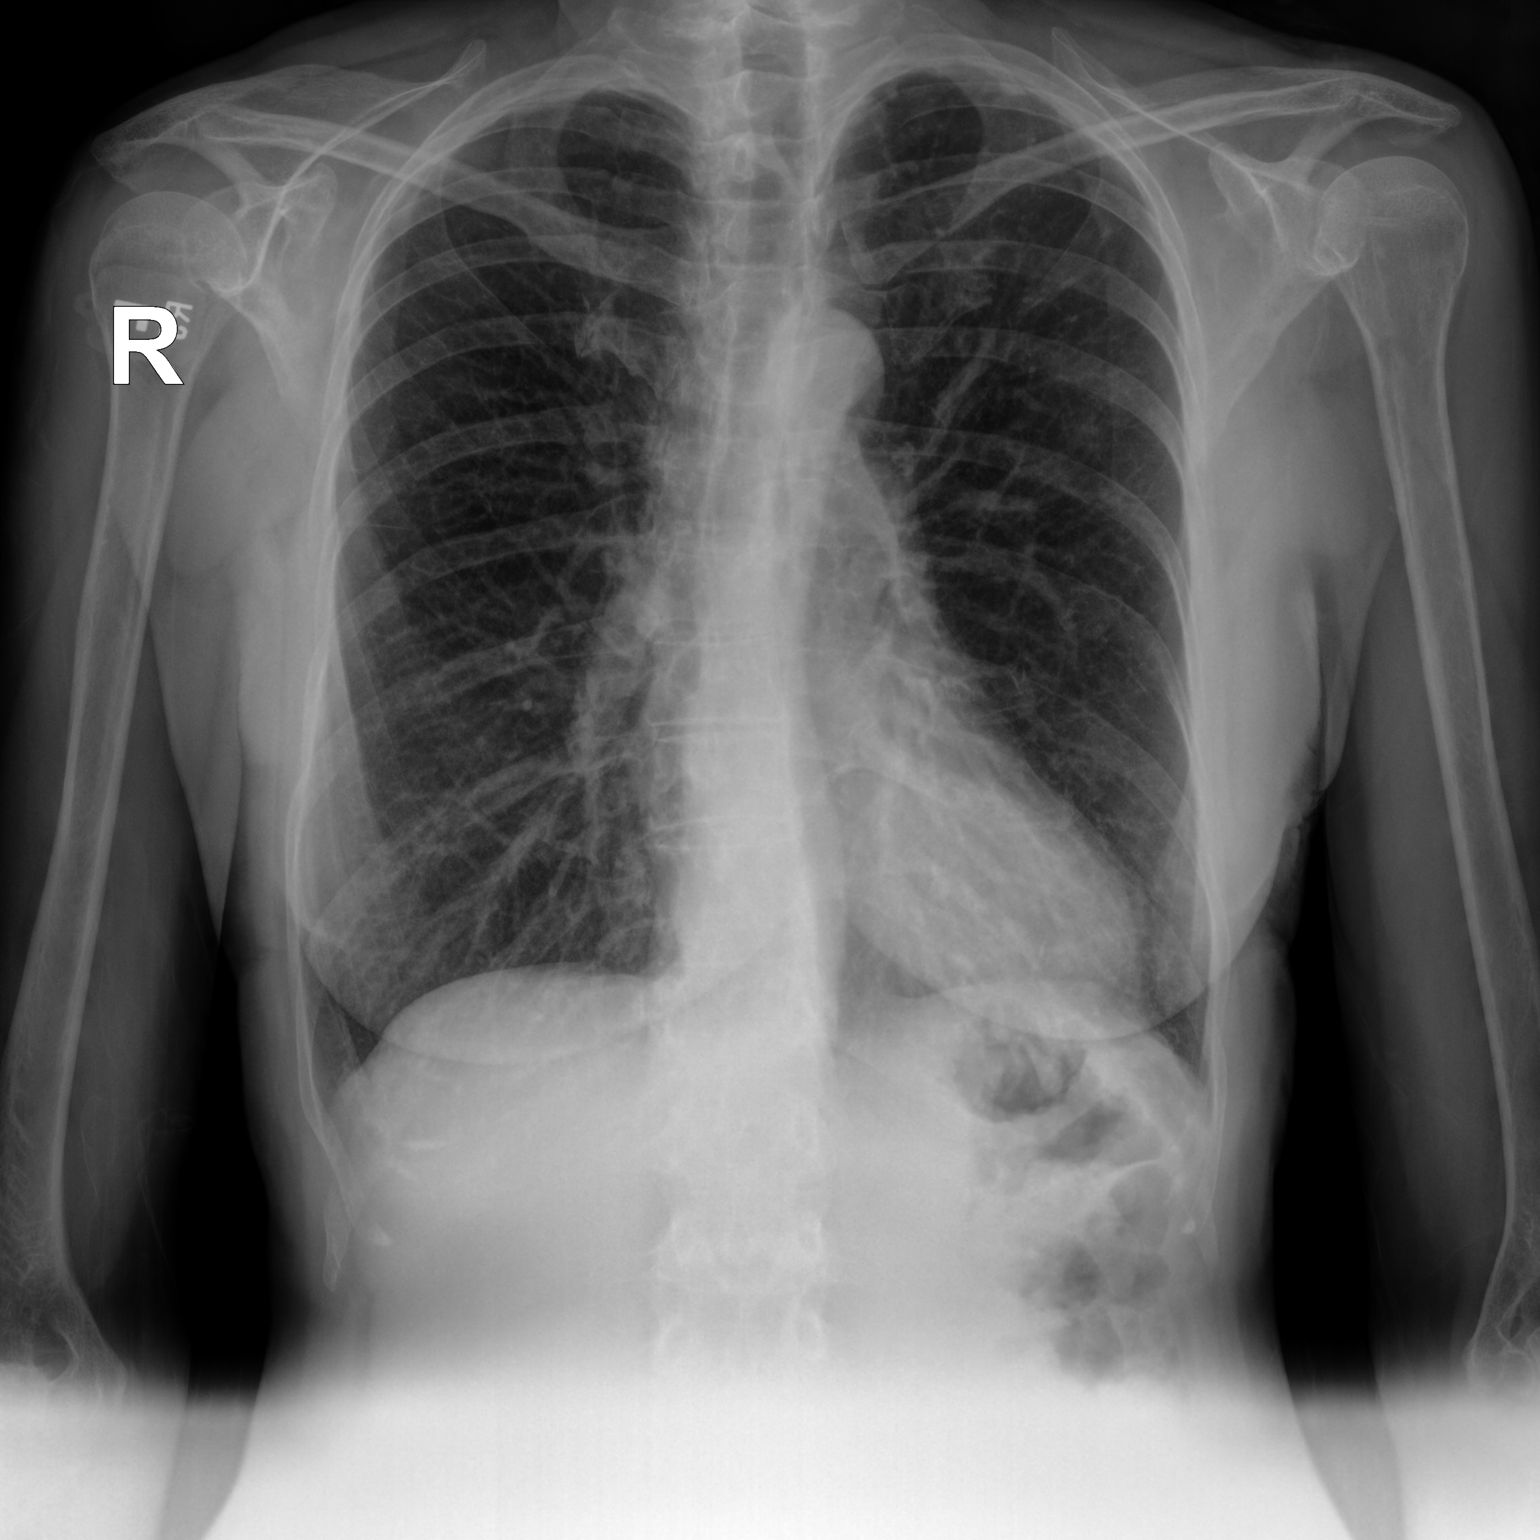

[chest lat]
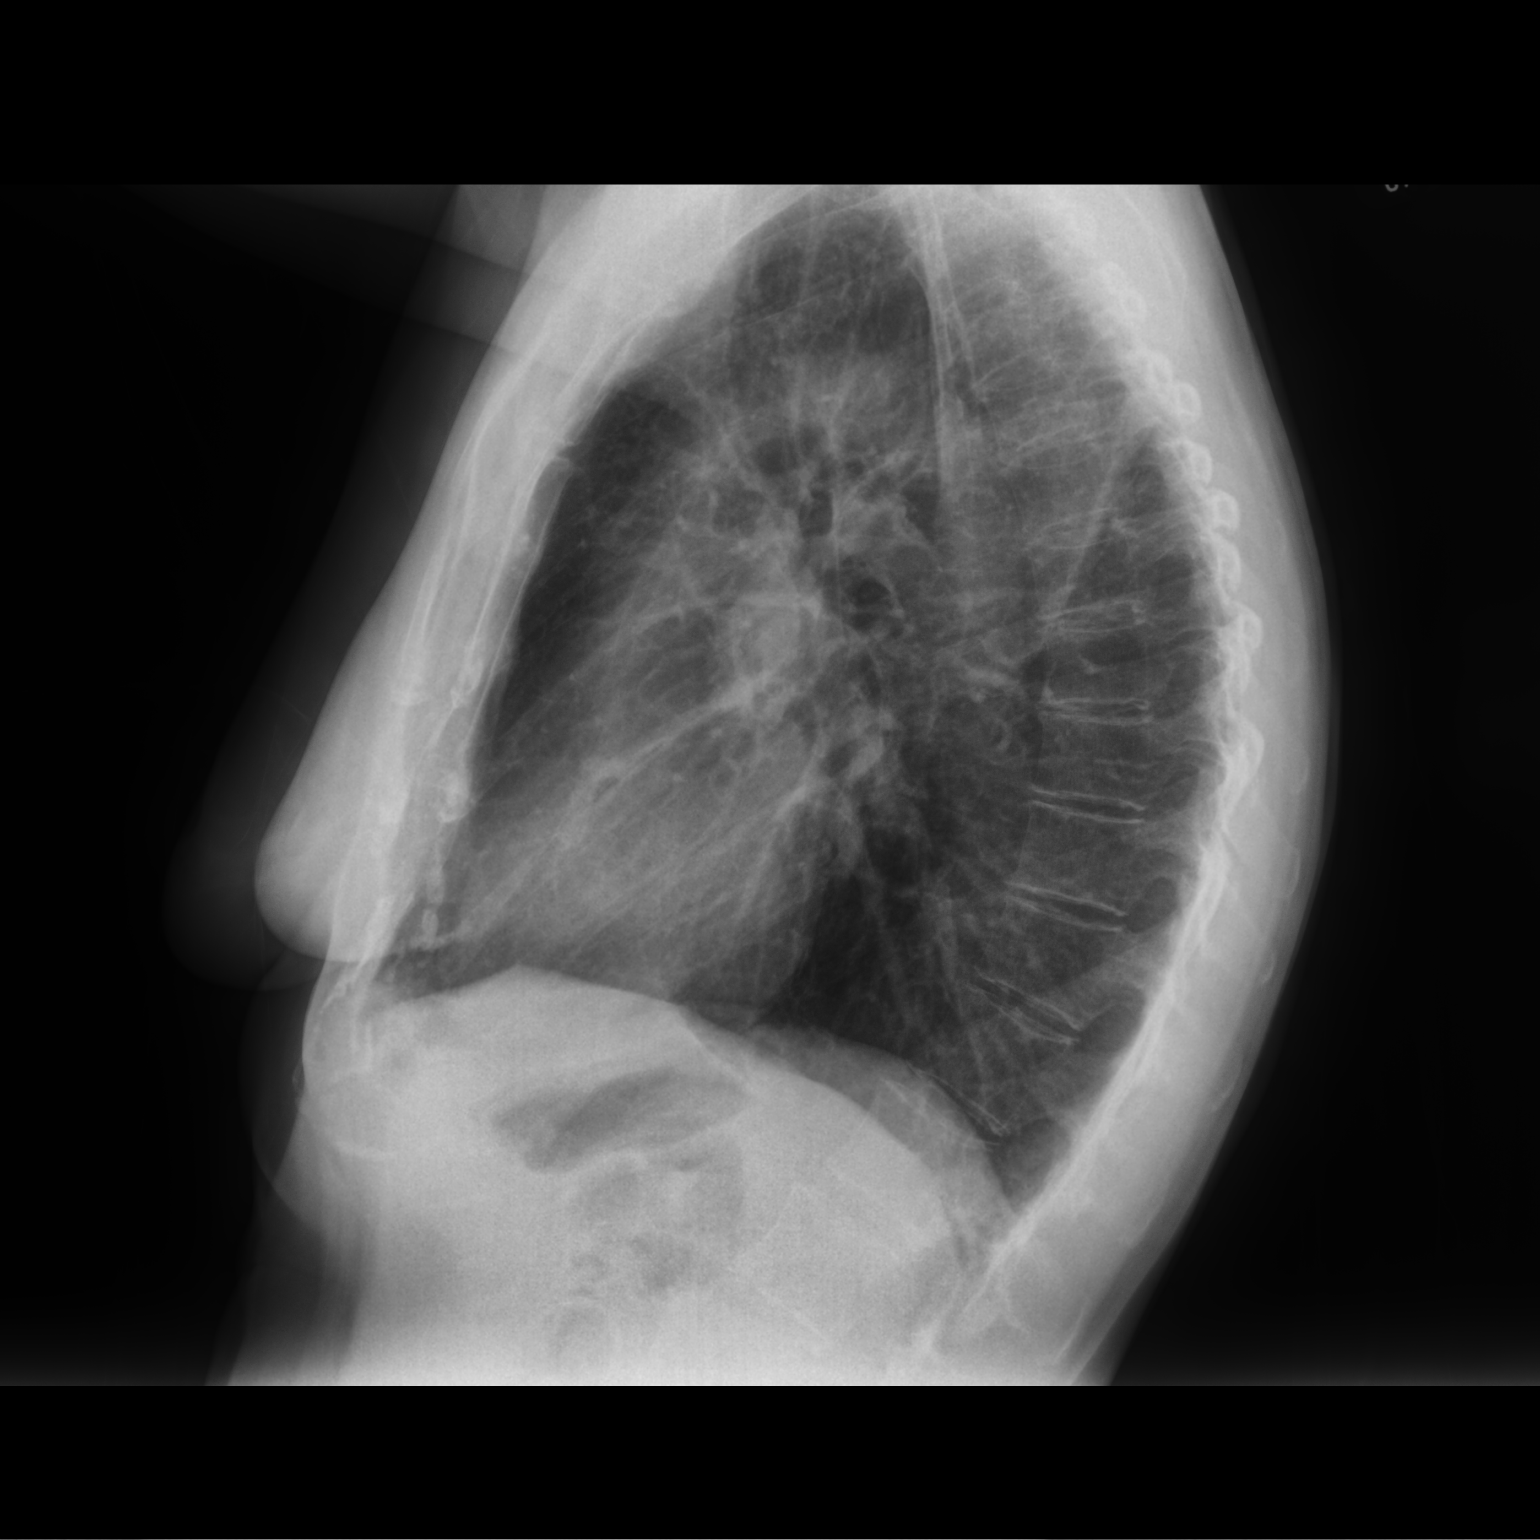

[2 of 2 positions shown; findings below may reference images not displayed]

FINDINGS: The heart size and mediastinal contours are within normal limits.
Pulmonary hyperinflation again seen, suspicious for COPD. Both lungs
are clear. The visualized skeletal structures are unremarkable.
IMPRESSION: COPD. No active cardiopulmonary disease.

## 2023-11-24 ENCOUNTER — Ambulatory Visit: Payer: 59 | Admitting: Physical Therapy

## 2023-12-01 DIAGNOSIS — M79672 Pain in left foot: Secondary | ICD-10-CM | POA: Diagnosis not present

## 2023-12-01 DIAGNOSIS — M25641 Stiffness of right hand, not elsewhere classified: Secondary | ICD-10-CM | POA: Diagnosis not present

## 2023-12-01 DIAGNOSIS — M154 Erosive (osteo)arthritis: Secondary | ICD-10-CM | POA: Diagnosis not present

## 2023-12-01 DIAGNOSIS — M25642 Stiffness of left hand, not elsewhere classified: Secondary | ICD-10-CM | POA: Diagnosis not present

## 2023-12-01 DIAGNOSIS — L600 Ingrowing nail: Secondary | ICD-10-CM | POA: Diagnosis not present

## 2023-12-01 DIAGNOSIS — L03032 Cellulitis of left toe: Secondary | ICD-10-CM | POA: Diagnosis not present

## 2023-12-01 DIAGNOSIS — M6281 Muscle weakness (generalized): Secondary | ICD-10-CM | POA: Diagnosis not present

## 2023-12-01 DIAGNOSIS — M79675 Pain in left toe(s): Secondary | ICD-10-CM | POA: Diagnosis not present

## 2023-12-04 ENCOUNTER — Encounter: Payer: Self-pay | Admitting: Internal Medicine

## 2023-12-06 DIAGNOSIS — R202 Paresthesia of skin: Secondary | ICD-10-CM | POA: Diagnosis not present

## 2023-12-06 DIAGNOSIS — M25641 Stiffness of right hand, not elsewhere classified: Secondary | ICD-10-CM | POA: Diagnosis not present

## 2023-12-06 DIAGNOSIS — M6281 Muscle weakness (generalized): Secondary | ICD-10-CM | POA: Diagnosis not present

## 2023-12-06 DIAGNOSIS — M154 Erosive (osteo)arthritis: Secondary | ICD-10-CM | POA: Diagnosis not present

## 2023-12-06 DIAGNOSIS — M25642 Stiffness of left hand, not elsewhere classified: Secondary | ICD-10-CM | POA: Diagnosis not present

## 2023-12-06 DIAGNOSIS — T1490XS Injury, unspecified, sequela: Secondary | ICD-10-CM | POA: Diagnosis not present

## 2023-12-08 DIAGNOSIS — T1490XS Injury, unspecified, sequela: Secondary | ICD-10-CM | POA: Diagnosis not present

## 2023-12-08 DIAGNOSIS — M6281 Muscle weakness (generalized): Secondary | ICD-10-CM | POA: Diagnosis not present

## 2023-12-08 DIAGNOSIS — M25641 Stiffness of right hand, not elsewhere classified: Secondary | ICD-10-CM | POA: Diagnosis not present

## 2023-12-08 DIAGNOSIS — M25642 Stiffness of left hand, not elsewhere classified: Secondary | ICD-10-CM | POA: Diagnosis not present

## 2023-12-08 DIAGNOSIS — R202 Paresthesia of skin: Secondary | ICD-10-CM | POA: Diagnosis not present

## 2023-12-08 DIAGNOSIS — M154 Erosive (osteo)arthritis: Secondary | ICD-10-CM | POA: Diagnosis not present

## 2023-12-09 ENCOUNTER — Ambulatory Visit: Payer: 59

## 2023-12-09 VITALS — Ht 65.75 in | Wt 129.0 lb

## 2023-12-09 DIAGNOSIS — Z0000 Encounter for general adult medical examination without abnormal findings: Secondary | ICD-10-CM | POA: Diagnosis not present

## 2023-12-09 NOTE — Patient Instructions (Signed)
 Ms. Rybicki , Thank you for taking time to come for your Medicare Wellness Visit. I appreciate your ongoing commitment to your health goals. Please review the following plan we discussed and let me know if I can assist you in the future.   Referrals/Orders/Follow-Ups/Clinician Recommendations: Aim for 30 minutes of exercise or brisk walking, 6-8 glasses of water, and 5 servings of fruits and vegetables each day.  This is a list of the screening recommended for you and due dates:  Health Maintenance  Topic Date Due   Flu Shot  01/31/2024*   DEXA scan (bone density measurement)  12/25/2023   Mammogram  02/10/2024   Medicare Annual Wellness Visit  12/08/2024   Colon Cancer Screening  07/08/2027   DTaP/Tdap/Td vaccine (4 - Td or Tdap) 06/16/2032   Pneumonia Vaccine  Completed   Hepatitis C Screening  Completed   Zoster (Shingles) Vaccine  Completed   HPV Vaccine  Aged Out   COVID-19 Vaccine  Discontinued  *Topic was postponed. The date shown is not the original due date.    Advanced directives: (In Chart) A copy of your advanced directives are scanned into your chart should your provider ever need it.  Next Medicare Annual Wellness Visit scheduled for next year: Yes

## 2023-12-09 NOTE — Progress Notes (Signed)
 Subjective:   Kristin Wallace is a 68 y.o. female who presents for Medicare Annual (Subsequent) preventive examination.  Visit Complete: Virtual I connected with  Kristin Wallace on 12/09/23 by a audio enabled telemedicine application and verified that I am speaking with the correct person using two identifiers.  Patient Location: Home  Provider Location: Home Office  This patient declined Interactive audio and video telecommunications. Therefore the visit was completed with audio only.  I discussed the limitations of evaluation and management by telemedicine. The patient expressed understanding and agreed to proceed.  Vital Signs: Because this visit was a virtual/telehealth visit, some criteria may be missing or patient reported. Any vitals not documented were not able to be obtained and vitals that have been documented are patient reported.  Patient Medicare AWV questionnaire was completed by the patient on 12/05/23; I have confirmed that all information answered by patient is correct and no changes since this date.  Cardiac Risk Factors include: advanced age (>41men, >86 women)     Objective:    Today's Vitals   12/09/23 1545  Weight: 129 lb (58.5 kg)  Height: 5' 5.75 (1.67 m)   Body mass index is 20.98 kg/m.     12/09/2023    3:48 PM 11/26/2022    2:09 PM 07/07/2022   11:12 AM 11/25/2021    4:01 PM  Advanced Directives  Does Patient Have a Medical Advance Directive? Yes Yes Yes Yes  Type of Estate Agent of [place];Living will Healthcare Power of [place];Living will Living will;Healthcare Power of [employer] Power of Attorney  Does patient want to make changes to medical advance directive? No - Patient declined No - Patient declined    Copy of Healthcare Power of Attorney in Chart? Yes - validated most recent copy scanned in chart (See row information) Yes - validated most recent copy scanned in chart (See row information) No - copy requested Yes -  validated most recent copy scanned in chart (See row information)    Current Medications (verified) Outpatient Encounter Medications as of 12/09/2023  Medication Sig   albuterol  (VENTOLIN  HFA) 108 (90 Base) MCG/ACT inhaler Inhale into the lungs.   Ascorbic Acid (VITAMIN C) 1000 MG tablet Take 1,000 mg by mouth in the morning.   budesonide -formoterol  (SYMBICORT ) 160-4.5 MCG/ACT inhaler Inhale 2 puffs into the lungs 2 (two) times daily.   Calcium  Carb-Cholecalciferol (CALCIUM  600+D3 PO) Take 1 tablet by mouth every morning.   celecoxib  (CELEBREX ) 100 MG capsule Take 1 capsule (100 mg total) by mouth daily as needed.   Cholecalciferol (VITAMIN D3) 125 MCG (5000 UT) CAPS Take 5,000 Units by mouth in the morning.   CINNAMON PO Take 1 Dose by mouth See admin instructions. 1 teaspoonful twice daily with 2 teaspoonful of honey   cycloSPORINE (RESTASIS) 0.05 % ophthalmic emulsion Place 2 drops into both eyes 2 (two) times daily.   Multiple Vitamins-Minerals (MULTIVITAMIN WOMEN 50+) TABS Take 1 tablet by mouth in the morning.   pantoprazole  (PROTONIX ) 40 MG tablet TAKE 1 TABLET BY MOUTH DAILY   pregabalin  (LYRICA ) 75 MG capsule Take 1 capsule (75 mg total) by mouth 2 (two) times daily.   PSYLLIUM HUSK PO Take 1 Dose by mouth in the morning.   SODIUM FLUORIDE 5000 PPM 1.1 % PSTE Take by mouth.   TURMERIC CURCUMIN PO Take 1 capsule by mouth in the morning and at bedtime. W/Ginger Powder   zoledronic  acid (RECLAST ) 5 MG/100ML SOLN injection Inject into the vein.   [  DISCONTINUED] carboxymethylcellul-glycerin (OPTIVE) 0.5-0.9 % ophthalmic solution Place 1 drop into both eyes 2 (two) times daily as needed for dry eyes.   No facility-administered encounter medications on file as of 12/09/2023.    Allergies (verified) Other and Shellfish allergy   History: Past Medical History:  Diagnosis Date   Allergy 1970?   shellfish   Anxiety ?   Arthritis    erosive osteoarthritis   Asthma    Atrial  fibrillation (HCC)    Cataract    They were removed   Chronically dry eyes    Fatigue    chronic ( and weakness)   GERD (gastroesophageal reflux disease)    Lyme disease    Neuromuscular disorder (HCC)    neuropathy   Neuropathy    legs and feet   Osteoporosis    Sleep apnea 03/2023   Uses continuous positive airway pressure (CPAP) ventilation at home    Past Surgical History:  Procedure Laterality Date   ABDOMINAL HYSTERECTOMY     BREAST SURGERY Left    neg tumor   COLONOSCOPY     COLONOSCOPY WITH PROPOFOL  N/A 07/07/2022   Procedure: COLONOSCOPY WITH PROPOFOL ;  Surgeon: Cindie Carlin POUR, DO;  [facility]: [facility];  Service: Endoscopy;  Laterality: N/A;  1:15pm, ASA 2, per office pt can't move up   ESOPHAGOGASTRODUODENOSCOPY     EYE SURGERY  ?   torn retina, cataracts   POLYPECTOMY  07/07/2022   Procedure: POLYPECTOMY;  Surgeon: Cindie Carlin POUR, DO;  Location: [facility];  Service: Endoscopy;;   RIGHT AND LEFT HEART CATH     TUBAL LIGATION     Family History  Problem Relation Age of Onset   Depression Mother    Other Mother        suicide   Other Father        plane crash   Arthritis Father    Heart disease Sister    Early death Sister    Cancer Brother    Cancer Brother        skin cancer   Depression Daughter    Thyroid  disease Daughter    Migraines Daughter    Colon cancer Neg Hx    Social History   Socioeconomic History   Marital status: Divorced    Spouse name: Not on file   Number of children: 3   Years of education: Not on file   Highest education level: GED or equivalent  Occupational History   Occupation: retired  Tobacco Use   Smoking status: Never    Passive exposure: Never   Smokeless tobacco: Never  Vaping Use   Vaping status: Never Used  Substance and Sexual Activity   Alcohol use: Yes    Comment: Occasionally drink small amounts   Drug use: Never   Sexual activity: Not Currently  Other Topics Concern   Not on file   Social History Narrative   Moved here from [place]  this year 07/27/2021.   Daughter lives with her   Right handed   Social Drivers of Health   Financial Resource Strain: Low Risk  (12/09/2023)   Overall Financial Resource Strain (CARDIA)    Difficulty of Paying Living Expenses: Not hard at all  Recent Concern: Financial Resource Strain - High Risk (11/04/2023)   Overall Financial Resource Strain (CARDIA)    Difficulty of Paying Living Expenses: Hard  Food Insecurity: No Food Insecurity (12/09/2023)   Hunger Vital Sign    Worried About Running Out of Food  in the Last Year: Never true    Ran Out of Food in the Last Year: Never true  Recent Concern: Food Insecurity - Food Insecurity Present (11/04/2023)   Hunger Vital Sign    Worried About Running Out of Food in the Last Year: Sometimes true    Ran Out of Food in the Last Year: Sometimes true  Transportation Needs: No Transportation Needs (12/09/2023)   PRAPARE - Administrator, Civil Service (Medical): No    Lack of Transportation (Non-Medical): No  Physical Activity: Sufficiently Active (12/09/2023)   Exercise Vital Sign    Days of Exercise per Week: 5 days    Minutes of Exercise per Session: 30 min  Stress: No Stress Concern Present (12/09/2023)   [employer] of Occupational Health - Occupational Stress Questionnaire    Feeling of Stress : Only a little  Recent Concern: Stress - Stress Concern Present (11/04/2023)   [employer] of Occupational Health - Occupational Stress Questionnaire    Feeling of Stress : To some extent  Social Connections: Moderately Integrated (12/09/2023)   Social Connection and Isolation Panel [NHANES]    Frequency of Communication with Friends and Family: More than three times a week    Frequency of Social Gatherings with Friends and Family: Three times a week    Attends Religious Services: More than 4 times per year    Active Member of Clubs or Organizations: Yes    Attends Museum/gallery Exhibitions Officer: More than 4 times per year    Marital Status: Divorced    Tobacco Counseling Counseling given: Not Answered   Clinical Intake:  Pre-visit preparation completed: Yes  Pain : No/denies pain     Diabetes: No  How often do you need to have someone help you when you read instructions, pamphlets, or other written materials from your doctor or pharmacy?: 1 - Never  Interpreter Needed?: No  Information entered by :: Charmaine Bloodgood LPN   Activities of Daily Living    12/05/2023    5:23 PM  In your present state of health, do you have any difficulty performing the following activities:  Hearing? 0  Vision? 1  Difficulty concentrating or making decisions? 1  Walking or climbing stairs? 0  Dressing or bathing? 0  Doing errands, shopping? 0  Preparing Food and eating ? N  Using the Toilet? N  In the past six months, have you accidently leaked urine? Y  Do you have problems with loss of bowel control? N  Managing your Medications? N  Managing your Finances? N  Housekeeping or managing your Housekeeping? N    Patient Care Team: Severa Rock HERO, FNP as PCP - General (Family Medicine) Octavia Charleston, MD as Referring Physician (Ophthalmology)  Indicate any recent Medical Services you may have received from other than [facility] providers in the past year (date may be approximate).     Assessment:   This is a routine wellness examination for [place].  Hearing/Vision screen Hearing Screening - Comments:: Denies hearing difficulties   Vision Screening - Comments:: Wears rx glasses - up to date with routine eye exams with Dr. Octavia    Goals Addressed             This Visit's Progress    Remain active and independent        Depression Screen    12/09/2023    3:49 PM 11/11/2023    3:13 PM 05/13/2023    2:23 PM 12/30/2022   12:14  PM 12/04/2022   11:21 AM 11/26/2022    2:07 PM 11/13/2022   12:23 PM  PHQ 2/9 Scores  PHQ - 2 Score 0 0 0 0 0 0 0  PHQ- 9 Score 2 2  4 7 6  0 0    Fall Risk    12/05/2023    5:23 PM 11/11/2023    3:13 PM 05/13/2023    2:23 PM 12/30/2022   12:14 PM 12/04/2022   11:21 AM  Fall Risk   Falls in the past year? 0 0 0 0 0  Number falls in past yr: 0      Injury with Fall? 0      Risk for fall due to : No Fall Risks No Fall Risks     Follow up Falls prevention discussed;Education provided;Falls evaluation completed Falls evaluation completed       MEDICARE RISK AT HOME: Medicare Risk at Home Any stairs in or around the home?: (Patient-Rptd) Yes If so, are there any without handrails?: (Patient-Rptd) No Home free of loose throw rugs in walkways, pet beds, electrical cords, etc?: (Patient-Rptd) Yes Adequate lighting in your home to reduce risk of falls?: (Patient-Rptd) No Life alert?: (Patient-Rptd) No Use of a cane, walker or w/c?: (Patient-Rptd) No Grab bars in the bathroom?: (Patient-Rptd) No Shower chair or bench in shower?: (Patient-Rptd) No Elevated toilet seat or a handicapped toilet?: (Patient-Rptd) No  TIMED UP AND GO:  Was the test performed?  No    Cognitive Function:        12/09/2023    3:48 PM 11/26/2022    2:09 PM 11/25/2021    3:34 PM  6CIT Screen  What Year? 0 points 0 points 0 points  What month? 0 points 0 points 0 points  What time? 0 points 0 points 0 points  Count back from 20 0 points 0 points 0 points  Months in reverse 0 points 0 points 2 points  Repeat phrase 0 points 0 points 2 points  Total Score 0 points 0 points 4 points    Immunizations Immunization History  Administered Date(s) Administered   Influenza Split 09/02/2013   Moderna SARS-COV2 Booster Vaccination 09/06/2020, 05/17/2021   Moderna Sars-Covid-2 Vaccination 11/13/2019, 12/11/2019   PNEUMOCOCCAL CONJUGATE-20 05/13/2023   Pneumococcal Polysaccharide-23 01/02/2021   Td 01/17/2010   Tdap 04/06/2018, 06/16/2022   Zoster Recombinant(Shingrix) 09/18/2020, 01/03/2021    TDAP status: Up to date  Flu Vaccine status: Up to  date  Pneumococcal vaccine status: Up to date  Covid-19 vaccine status: Information provided on how to obtain vaccines.   Qualifies for Shingles Vaccine? Yes   Zostavax completed No   Shingrix Completed?: Yes  Screening Tests Health Maintenance  Topic Date Due   INFLUENZA VACCINE  01/31/2024 (Originally 06/03/2023)   DEXA SCAN  12/25/2023   MAMMOGRAM  02/10/2024   Medicare Annual Wellness (AWV)  12/08/2024   Colonoscopy  07/08/2027   DTaP/Tdap/Td (4 - Td or Tdap) 06/16/2032   Pneumonia Vaccine 68+ Years old  Completed   Hepatitis C Screening  Completed   Zoster Vaccines- Shingrix  Completed   HPV VACCINES  Aged Out   COVID-19 Vaccine  Discontinued    Health Maintenance  There are no preventive care reminders to display for this patient.   Colorectal cancer screening: Type of screening: Colonoscopy. Completed 07/07/22. Repeat every 5 years  Mammogram status: Completed 02/10/23. Repeat every year  Bone Density status: Completed 12/24/21. Results reflect: Bone density results: OSTEOPOROSIS. Repeat every 2 years.  Lung Cancer Screening: (Low Dose CT Chest recommended if Age 23-80 years, 20 pack-year currently smoking OR have quit w/in 15years.) does not qualify.   Lung Cancer Screening Referral: n/a  Additional Screening:  Hepatitis C Screening: does qualify; Completed 12/24/21  Vision Screening: Recommended annual ophthalmology exams for early detection of glaucoma and other disorders of the eye. Is the patient up to date with their annual eye exam?  Yes  Who is the provider or what is the name of the office in which the patient attends annual eye exams? [facility]  If pt is not established with a provider, would they like to be referred to a provider to establish care? No .   Dental Screening: Recommended annual dental exams for proper oral hygiene  Community Resource Referral / Chronic Care Management: CRR required this visit?  No   CCM required this visit?   No     Plan:     I have personally reviewed and noted the following in the patient's chart:   Medical and social history Use of alcohol, tobacco or illicit drugs  Current medications and supplements including opioid prescriptions. Patient is not currently taking opioid prescriptions. Functional ability and status Nutritional status Physical activity Advanced directives List of other physicians Hospitalizations, surgeries, and ER visits in previous 12 months Vitals Screenings to include cognitive, depression, and falls Referrals and appointments  In addition, I have reviewed and discussed with patient certain preventive protocols, quality metrics, and best practice recommendations. A written personalized care plan for preventive services as well as general preventive health recommendations were provided to patient.     Lavelle Pfeiffer [place], [place]   05/04/7973   After Visit Summary: (MyChart) Due to this being a telephonic visit, the after visit summary with patients personalized plan was offered to patient via MyChart   Nurse Notes: No concerns at this time

## 2023-12-13 DIAGNOSIS — M9902 Segmental and somatic dysfunction of thoracic region: Secondary | ICD-10-CM | POA: Diagnosis not present

## 2023-12-13 DIAGNOSIS — M79675 Pain in left toe(s): Secondary | ICD-10-CM | POA: Diagnosis not present

## 2023-12-13 DIAGNOSIS — L600 Ingrowing nail: Secondary | ICD-10-CM | POA: Diagnosis not present

## 2023-12-13 DIAGNOSIS — M25642 Stiffness of left hand, not elsewhere classified: Secondary | ICD-10-CM | POA: Diagnosis not present

## 2023-12-13 DIAGNOSIS — M154 Erosive (osteo)arthritis: Secondary | ICD-10-CM | POA: Diagnosis not present

## 2023-12-13 DIAGNOSIS — M9903 Segmental and somatic dysfunction of lumbar region: Secondary | ICD-10-CM | POA: Diagnosis not present

## 2023-12-13 DIAGNOSIS — T1490XS Injury, unspecified, sequela: Secondary | ICD-10-CM | POA: Diagnosis not present

## 2023-12-13 DIAGNOSIS — M6281 Muscle weakness (generalized): Secondary | ICD-10-CM | POA: Diagnosis not present

## 2023-12-13 DIAGNOSIS — M9901 Segmental and somatic dysfunction of cervical region: Secondary | ICD-10-CM | POA: Diagnosis not present

## 2023-12-13 DIAGNOSIS — M79672 Pain in left foot: Secondary | ICD-10-CM | POA: Diagnosis not present

## 2023-12-13 DIAGNOSIS — L03032 Cellulitis of left toe: Secondary | ICD-10-CM | POA: Diagnosis not present

## 2023-12-13 DIAGNOSIS — M546 Pain in thoracic spine: Secondary | ICD-10-CM | POA: Diagnosis not present

## 2023-12-13 DIAGNOSIS — M542 Cervicalgia: Secondary | ICD-10-CM | POA: Diagnosis not present

## 2023-12-13 DIAGNOSIS — R202 Paresthesia of skin: Secondary | ICD-10-CM | POA: Diagnosis not present

## 2023-12-13 DIAGNOSIS — M25641 Stiffness of right hand, not elsewhere classified: Secondary | ICD-10-CM | POA: Diagnosis not present

## 2023-12-14 ENCOUNTER — Other Ambulatory Visit: Payer: Self-pay | Admitting: Pharmacist

## 2023-12-14 NOTE — Progress Notes (Signed)
Next infusion scheduled for Reclast IV (U9811) on 01/12/2024 and due for updated orders. Referral placed to [facility] Diagnosis: age-related osteoporosis  Dose: 5mg  IV every 12 months  Last Clinic Visit: 09/27/2023 Next Clinic Visit: 04/03/2024  Last infusion: 01/12/2023  Labs: CBC, CMP and Vitamin D on 11/11/2023  Orders placed for Reclast IV (J3489) x 1 dose along with premedication of acetaminophen and diphenhydramine to be administered 30 minutes before medication infusion.  Chesley Mires, PharmD, MPH, BCPS, CPP Clinical Pharmacist (Rheumatology and Pulmonology)

## 2023-12-16 DIAGNOSIS — M79674 Pain in right toe(s): Secondary | ICD-10-CM | POA: Diagnosis not present

## 2023-12-16 DIAGNOSIS — M79671 Pain in right foot: Secondary | ICD-10-CM | POA: Diagnosis not present

## 2023-12-16 DIAGNOSIS — L609 Nail disorder, unspecified: Secondary | ICD-10-CM | POA: Diagnosis not present

## 2023-12-16 DIAGNOSIS — M79672 Pain in left foot: Secondary | ICD-10-CM | POA: Diagnosis not present

## 2023-12-16 DIAGNOSIS — L110 Acquired keratosis follicularis: Secondary | ICD-10-CM | POA: Diagnosis not present

## 2023-12-16 DIAGNOSIS — I739 Peripheral vascular disease, unspecified: Secondary | ICD-10-CM | POA: Diagnosis not present

## 2023-12-16 DIAGNOSIS — L600 Ingrowing nail: Secondary | ICD-10-CM | POA: Diagnosis not present

## 2023-12-16 DIAGNOSIS — G4733 Obstructive sleep apnea (adult) (pediatric): Secondary | ICD-10-CM | POA: Diagnosis not present

## 2023-12-16 DIAGNOSIS — M79675 Pain in left toe(s): Secondary | ICD-10-CM | POA: Diagnosis not present

## 2023-12-17 DIAGNOSIS — M25642 Stiffness of left hand, not elsewhere classified: Secondary | ICD-10-CM | POA: Diagnosis not present

## 2023-12-17 DIAGNOSIS — M6281 Muscle weakness (generalized): Secondary | ICD-10-CM | POA: Diagnosis not present

## 2023-12-17 DIAGNOSIS — T1490XS Injury, unspecified, sequela: Secondary | ICD-10-CM | POA: Diagnosis not present

## 2023-12-17 DIAGNOSIS — M154 Erosive (osteo)arthritis: Secondary | ICD-10-CM | POA: Diagnosis not present

## 2023-12-17 DIAGNOSIS — M25641 Stiffness of right hand, not elsewhere classified: Secondary | ICD-10-CM | POA: Diagnosis not present

## 2023-12-17 DIAGNOSIS — R202 Paresthesia of skin: Secondary | ICD-10-CM | POA: Diagnosis not present

## 2023-12-17 NOTE — Progress Notes (Signed)
Reclast scheduled for 01/17/2024

## 2023-12-20 DIAGNOSIS — G4733 Obstructive sleep apnea (adult) (pediatric): Secondary | ICD-10-CM | POA: Diagnosis not present

## 2023-12-20 DIAGNOSIS — R202 Paresthesia of skin: Secondary | ICD-10-CM | POA: Diagnosis not present

## 2023-12-20 DIAGNOSIS — M154 Erosive (osteo)arthritis: Secondary | ICD-10-CM | POA: Diagnosis not present

## 2023-12-20 DIAGNOSIS — M25642 Stiffness of left hand, not elsewhere classified: Secondary | ICD-10-CM | POA: Diagnosis not present

## 2023-12-20 DIAGNOSIS — M6281 Muscle weakness (generalized): Secondary | ICD-10-CM | POA: Diagnosis not present

## 2023-12-20 DIAGNOSIS — T1490XS Injury, unspecified, sequela: Secondary | ICD-10-CM | POA: Diagnosis not present

## 2023-12-20 DIAGNOSIS — M25641 Stiffness of right hand, not elsewhere classified: Secondary | ICD-10-CM | POA: Diagnosis not present

## 2023-12-21 ENCOUNTER — Encounter: Payer: Self-pay | Admitting: Internal Medicine

## 2023-12-21 ENCOUNTER — Ambulatory Visit (INDEPENDENT_AMBULATORY_CARE_PROVIDER_SITE_OTHER): Payer: 59 | Admitting: Family Medicine

## 2023-12-21 ENCOUNTER — Encounter: Payer: Self-pay | Admitting: Family Medicine

## 2023-12-21 VITALS — BP 116/63 | HR 76 | Temp 96.9°F | Ht 65.75 in | Wt 128.6 lb

## 2023-12-21 DIAGNOSIS — R221 Localized swelling, mass and lump, neck: Secondary | ICD-10-CM | POA: Diagnosis not present

## 2023-12-21 NOTE — Progress Notes (Signed)
 Subjective:  Patient ID: Kristin Wallace, female    DOB: 10-17-1956, 68 y.o.   MRN: [id number]  Patient Care Team: Sonny Masters, FNP as PCP - General (Family Medicine) Ernesto Rutherford, MD as Referring Physician (Ophthalmology)   Chief Complaint:  neck lump (Patient states that she has a lump on her neck for a few months. )   HPI: Kristin Wallace is a 68 y.o. female presenting on 12/21/2023 for neck lump (Patient states that she has a lump on her neck for a few months. )   Discussed the use of AI scribe software for clinical note transcription with the patient, who gave verbal consent to proceed.  History of Present Illness   Kristin Wallace is a 68 year old female who presents with discomfort in her throat.  She has been experiencing discomfort in her throat for approximately two months. The sensation is described as feeling like a nodule, which is uncomfortable to touch but not extremely painful. The discomfort is particularly noticeable when wearing clothing that touches her throat, such as turtlenecks or zipped-up garments, which previously did not cause any issues. No sore throat, fever, chills, night sweats, rapid weight loss, or changes in appetite. She is able to swallow easily and does not experience shooting pain in the throat area.  She has a history of asthma and sometimes experiences shortness of breath, especially when bending over. These symptoms have been present since the pandemic and are not new or worsening. Occasional coughing is noted due to asthma.          Relevant past medical, surgical, family, and social history reviewed and updated as indicated.  Allergies and medications reviewed and updated. Data reviewed: Chart in Epic.   Past Medical History:  Diagnosis Date   Allergy 1970?   shellfish   Anxiety ?   Arthritis    erosive osteoarthritis   Asthma    Atrial fibrillation (HCC)    Cataract    They were removed   Chronically dry eyes    Fatigue     chronic ( and weakness)   GERD (gastroesophageal reflux disease)    Lyme disease    Neuromuscular disorder (HCC)    neuropathy   Neuropathy    legs and feet   Osteoporosis    Sleep apnea 03/2023   Uses continuous positive airway pressure (CPAP) ventilation at home     Past Surgical History:  Procedure Laterality Date   ABDOMINAL HYSTERECTOMY     BREAST SURGERY Left    neg tumor   COLONOSCOPY     COLONOSCOPY WITH PROPOFOL N/A 07/07/2022   Procedure: COLONOSCOPY WITH PROPOFOL;  Surgeon: Lanelle Bal, DO;  Location: [facility];  Service: Endoscopy;  Laterality: N/A;  1:15pm, ASA 2, per office pt can't move up   ESOPHAGOGASTRODUODENOSCOPY     EYE SURGERY  ?   torn retina, cataracts   POLYPECTOMY  07/07/2022   Procedure: POLYPECTOMY;  Surgeon: Lanelle Bal, DO;  Location: [facility];  Service: Endoscopy;;   RIGHT AND LEFT HEART CATH     TUBAL LIGATION      Social History   Socioeconomic History   Marital status: Divorced    Spouse name: Not on file   Number of children: 3   Years of education: Not on file   Highest education level: GED or equivalent  Occupational History   Occupation: retired  Tobacco Use   Smoking status: Never    Passive exposure:  Never   Smokeless tobacco: Never  Vaping Use   Vaping status: Never Used  Substance and Sexual Activity   Alcohol use: Yes    Comment: Occasionally drink small amounts   Drug use: Never   Sexual activity: Not Currently  Other Topics Concern   Not on file  Social History Narrative   Moved here from [place] this year 07/27/2021.   Daughter lives with her   Right handed   Social Drivers of Health   Financial Resource Strain: Low Risk  (12/09/2023)   Overall Financial Resource Strain (CARDIA)    Difficulty of Paying Living Expenses: Not hard at all  Recent Concern: Financial Resource Strain - High Risk (11/04/2023)   Overall Financial Resource Strain (CARDIA)    Difficulty of Paying Living Expenses: Hard   Food Insecurity: No Food Insecurity (12/09/2023)   Hunger Vital Sign    Worried About Running Out of Food in the Last Year: Never true    Ran Out of Food in the Last Year: Never true  Recent Concern: Food Insecurity - Food Insecurity Present (11/04/2023)   Hunger Vital Sign    Worried About Running Out of Food in the Last Year: Sometimes true    Ran Out of Food in the Last Year: Sometimes true  Transportation Needs: No Transportation Needs (12/09/2023)   PRAPARE - Administrator, Civil Service (Medical): No    Lack of Transportation (Non-Medical): No  Physical Activity: Sufficiently Active (12/09/2023)   Exercise Vital Sign    Days of Exercise per Week: 5 days    Minutes of Exercise per Session: 30 min  Stress: No Stress Concern Present (12/09/2023)   [employer] of Occupational Health - Occupational Stress Questionnaire    Feeling of Stress : Only a little  Recent Concern: Stress - Stress Concern Present (11/04/2023)   [employer] of Occupational Health - Occupational Stress Questionnaire    Feeling of Stress : To some extent  Social Connections: Moderately Integrated (12/09/2023)   Social Connection and Isolation Panel [NHANES]    Frequency of Communication with Friends and Family: More than three times a week    Frequency of Social Gatherings with Friends and Family: Three times a week    Attends Religious Services: More than 4 times per year    Active Member of Clubs or Organizations: Yes    Attends Banker Meetings: More than 4 times per year    Marital Status: Divorced  Intimate Partner Violence: Not At Risk (12/09/2023)   Humiliation, Afraid, Rape, and Kick questionnaire    Fear of Current or Ex-Partner: No    Emotionally Abused: No    Physically Abused: No    Sexually Abused: No    Outpatient Encounter Medications as of 12/21/2023  Medication Sig   albuterol (VENTOLIN HFA) 108 (90 Base) MCG/ACT inhaler Inhale into the lungs.   Ascorbic Acid  (VITAMIN C) 1000 MG tablet Take 1,000 mg by mouth in the morning.   budesonide-formoterol (SYMBICORT) 160-4.5 MCG/ACT inhaler Inhale 2 puffs into the lungs 2 (two) times daily.   Calcium Carb-Cholecalciferol (CALCIUM 600+D3 PO) Take 1 tablet by mouth every morning.   celecoxib (CELEBREX) 100 MG capsule Take 1 capsule (100 mg total) by mouth daily as needed.   Cholecalciferol (VITAMIN D3) 125 MCG (5000 UT) CAPS Take 5,000 Units by mouth in the morning.   CINNAMON PO Take 1 Dose by mouth See admin instructions. 1 teaspoonful twice daily with 2 teaspoonful of honey  cycloSPORINE (RESTASIS) 0.05 % ophthalmic emulsion Place 2 drops into both eyes 2 (two) times daily.   Multiple Vitamins-Minerals (MULTIVITAMIN WOMEN 50+) TABS Take 1 tablet by mouth in the morning.   pantoprazole (PROTONIX) 40 MG tablet TAKE 1 TABLET BY MOUTH DAILY   pregabalin (LYRICA) 75 MG capsule Take 1 capsule (75 mg total) by mouth 2 (two) times daily.   PSYLLIUM HUSK PO Take 1 Dose by mouth in the morning.   SODIUM FLUORIDE 5000 PPM 1.1 % PSTE Take by mouth.   TURMERIC CURCUMIN PO Take 1 capsule by mouth in the morning and at bedtime. W/Ginger Powder   zoledronic acid (RECLAST) 5 MG/100ML SOLN injection Inject into the vein.   No facility-administered encounter medications on file as of 12/21/2023.    Allergies  Allergen Reactions   Other Shortness Of Breath    Dairy   Shellfish Allergy Other (See Comments) and Nausea And Vomiting    Vomiting    Pertinent ROS per HPI, otherwise unremarkable      Objective:  BP 116/63   Pulse 76   Temp (!) 96.9 F (36.1 C)   Ht 5' 5.75" (1.67 m)   Wt 128 lb 9.6 oz (58.3 kg)   SpO2 94%   BMI 20.91 kg/m    Wt Readings from Last 3 Encounters:  12/21/23 128 lb 9.6 oz (58.3 kg)  12/09/23 129 lb (58.5 kg)  11/11/23 129 lb 3.2 oz (58.6 kg)    Physical Exam Vitals and nursing note reviewed.  Constitutional:      General: She is not in acute distress.    Appearance: Normal  appearance. She is normal weight. She is not ill-appearing, toxic-appearing or diaphoretic.  HENT:     Head: Normocephalic and atraumatic.     Nose: Nose normal.     Mouth/Throat:     Mouth: Mucous membranes are moist.     Pharynx: Oropharynx is clear.  Eyes:     Conjunctiva/sclera: Conjunctivae normal.     Pupils: Pupils are equal, round, and reactive to light.  Neck:     Thyroid: No thyroid mass or thyroid tenderness.     Trachea: Trachea and phonation normal.  Cardiovascular:     Rate and Rhythm: Normal rate and regular rhythm.     Heart sounds: Normal heart sounds.  Pulmonary:     Effort: Pulmonary effort is normal.     Breath sounds: Normal breath sounds.  Musculoskeletal:     Cervical back: Neck supple.     Right lower leg: No edema.     Left lower leg: No edema.  Lymphadenopathy:     Cervical:     Right cervical: No superficial, deep or posterior cervical adenopathy.    Left cervical: No superficial, deep or posterior cervical adenopathy.  Skin:    General: Skin is warm and dry.     Capillary Refill: Capillary refill takes less than 2 seconds.  Neurological:     General: No focal deficit present.     Mental Status: She is alert and oriented to person, place, and time.  Psychiatric:        Mood and Affect: Mood normal.        Behavior: Behavior normal.        Thought Content: Thought content normal.        Judgment: Judgment normal.    Physical Exam            Results for orders placed or performed in visit on 11/11/23  CMP14+EGFR   Collection Time: 11/11/23  3:42 PM  Result Value Ref Range   Glucose 99 70 - 99 mg/dL   BUN 9 8 - 27 mg/dL   Creatinine, Ser 4.09 0.57 - 1.00 mg/dL   eGFR 97 >81 XB/JYN/8.29   BUN/Creatinine Ratio 15 12 - 28   Sodium 140 134 - 144 mmol/L   Potassium 3.9 3.5 - 5.2 mmol/L   Chloride 101 96 - 106 mmol/L   CO2 24 20 - 29 mmol/L   Calcium 9.5 8.7 - 10.3 mg/dL   Total Protein 6.7 6.0 - 8.5 g/dL   Albumin 4.5 3.9 - 4.9 g/dL    Globulin, Total 2.2 1.5 - 4.5 g/dL   Bilirubin Total 0.3 0.0 - 1.2 mg/dL   Alkaline Phosphatase 53 44 - 121 IU/L   AST 17 0 - 40 IU/L   ALT 13 0 - 32 IU/L  CBC with Differential/Platelet   Collection Time: 11/11/23  3:42 PM  Result Value Ref Range   WBC 4.1 3.4 - 10.8 x10E3/uL   RBC 4.01 3.77 - 5.28 x10E6/uL   Hemoglobin 11.9 11.1 - 15.9 g/dL   Hematocrit 56.2 13.0 - 46.6 %   MCV 90 79 - 97 fL   MCH 29.7 26.6 - 33.0 pg   MCHC 33.0 31.5 - 35.7 g/dL   RDW 86.5 78.4 - 69.6 %   Platelets 310 150 - 450 x10E3/uL   Neutrophils 47 Not Estab. %   Lymphs 32 Not Estab. %   Monocytes 16 Not Estab. %   Eos 4 Not Estab. %   Basos 1 Not Estab. %   Neutrophils Absolute 1.9 1.4 - 7.0 x10E3/uL   Lymphocytes Absolute 1.3 0.7 - 3.1 x10E3/uL   Monocytes Absolute 0.7 0.1 - 0.9 x10E3/uL   EOS (ABSOLUTE) 0.2 0.0 - 0.4 x10E3/uL   Basophils Absolute 0.0 0.0 - 0.2 x10E3/uL   Immature Granulocytes 0 Not Estab. %   Immature Grans (Abs) 0.0 0.0 - 0.1 x10E3/uL  Thyroid Panel With TSH   Collection Time: 11/11/23  3:42 PM  Result Value Ref Range   TSH 1.710 0.450 - 4.500 uIU/mL   T4, Total 8.2 4.5 - 12.0 ug/dL   T3 Uptake Ratio 25 24 - 39 %   Free Thyroxine Index 2.1 1.2 - 4.9  VITAMIN D 25 Hydroxy (Vit-D Deficiency, Fractures)   Collection Time: 11/11/23  3:42 PM  Result Value Ref Range   Vit D, 25-Hydroxy 86.5 30.0 - 100.0 ng/mL  ToxASSURE Select 13 (MW), Urine   Collection Time: 11/11/23  3:46 PM  Result Value Ref Range   Summary FINAL        Pertinent labs & imaging results that were available during my care of the patient were reviewed by me and considered in my medical decision making.  Assessment & Plan:  Shalay was seen today for neck lump.  Diagnoses and all orders for this visit:  Mass in neck -     US Soft Tissue Head/Neck (NON-THYROID); Future     Assessment and Plan    Throat Discomfort Reports throat discomfort for two months, exacerbated by clothing or palpation. No fever,  chills, night sweats, weight loss, appetite changes, or blood in stool. January thyroid labs were normal. Differential includes thyroid nodule, lymphadenopathy, or other localized pathology. Informed consent provided for ultrasound, including potential findings and next steps. - Order throat ultrasound to evaluate thyroid and lymph nodes - Review ultrasound results and follow up  Asthma Asthma with occasional  cough and shortness of breath, particularly since the pandemic. No acute exacerbation. - Monitor asthma symptoms - Advise to report any worsening symptoms or difficulty breathing  Follow-up - Contact for scheduling ultrasound - Review ultrasound results and discuss next steps.          Continue all other maintenance medications.  Follow up plan: Return if symptoms worsen or fail to improve.   Continue healthy lifestyle choices, including diet (rich in fruits, vegetables, and lean proteins, and low in salt and simple carbohydrates) and exercise (at least 30 minutes of moderate physical activity daily).   The above assessment and management plan was discussed with the patient. The patient verbalized understanding of and has agreed to the management plan. Patient is aware to call the clinic if they develop any new symptoms or if symptoms persist or worsen. Patient is aware when to return to the clinic for a follow-up visit. Patient educated on when it is appropriate to go to the emergency department.   Kari Baars, FNP-C [facility] [phone]

## 2023-12-27 DIAGNOSIS — M25642 Stiffness of left hand, not elsewhere classified: Secondary | ICD-10-CM | POA: Diagnosis not present

## 2023-12-27 DIAGNOSIS — T1490XS Injury, unspecified, sequela: Secondary | ICD-10-CM | POA: Diagnosis not present

## 2023-12-27 DIAGNOSIS — M154 Erosive (osteo)arthritis: Secondary | ICD-10-CM | POA: Diagnosis not present

## 2023-12-27 DIAGNOSIS — R202 Paresthesia of skin: Secondary | ICD-10-CM | POA: Diagnosis not present

## 2023-12-27 DIAGNOSIS — M25641 Stiffness of right hand, not elsewhere classified: Secondary | ICD-10-CM | POA: Diagnosis not present

## 2023-12-27 DIAGNOSIS — M6281 Muscle weakness (generalized): Secondary | ICD-10-CM | POA: Diagnosis not present

## 2023-12-28 ENCOUNTER — Ambulatory Visit (HOSPITAL_COMMUNITY)
Admission: RE | Admit: 2023-12-28 | Discharge: 2023-12-28 | Disposition: A | Discharge status: Home / Self Care | Payer: 59 | Source: Ambulatory Visit | Attending: Family Medicine | Admitting: Family Medicine

## 2023-12-28 ENCOUNTER — Other Ambulatory Visit: Payer: Self-pay | Admitting: Internal Medicine

## 2023-12-28 DIAGNOSIS — M154 Erosive (osteo)arthritis: Secondary | ICD-10-CM

## 2023-12-28 DIAGNOSIS — R221 Localized swelling, mass and lump, neck: Secondary | ICD-10-CM | POA: Insufficient documentation

## 2023-12-29 DIAGNOSIS — R202 Paresthesia of skin: Secondary | ICD-10-CM | POA: Diagnosis not present

## 2023-12-29 DIAGNOSIS — M6281 Muscle weakness (generalized): Secondary | ICD-10-CM | POA: Diagnosis not present

## 2023-12-29 DIAGNOSIS — M25641 Stiffness of right hand, not elsewhere classified: Secondary | ICD-10-CM | POA: Diagnosis not present

## 2023-12-29 DIAGNOSIS — M25642 Stiffness of left hand, not elsewhere classified: Secondary | ICD-10-CM | POA: Diagnosis not present

## 2023-12-29 DIAGNOSIS — M154 Erosive (osteo)arthritis: Secondary | ICD-10-CM | POA: Diagnosis not present

## 2023-12-29 DIAGNOSIS — T1490XS Injury, unspecified, sequela: Secondary | ICD-10-CM | POA: Diagnosis not present

## 2024-01-03 ENCOUNTER — Ambulatory Visit (HOSPITAL_BASED_OUTPATIENT_CLINIC_OR_DEPARTMENT_OTHER): Admitting: Pulmonary Disease

## 2024-01-03 ENCOUNTER — Ambulatory Visit (HOSPITAL_BASED_OUTPATIENT_CLINIC_OR_DEPARTMENT_OTHER): Payer: 59 | Admitting: Pulmonary Disease

## 2024-01-03 ENCOUNTER — Encounter (HOSPITAL_BASED_OUTPATIENT_CLINIC_OR_DEPARTMENT_OTHER): Payer: Self-pay | Admitting: Pulmonary Disease

## 2024-01-03 VITALS — BP 108/60 | HR 69 | Ht 65.75 in | Wt 127.7 lb

## 2024-01-03 VITALS — HR 72 | Ht 65.75 in | Wt 127.0 lb

## 2024-01-03 DIAGNOSIS — J4530 Mild persistent asthma, uncomplicated: Secondary | ICD-10-CM

## 2024-01-03 DIAGNOSIS — J45909 Unspecified asthma, uncomplicated: Secondary | ICD-10-CM

## 2024-01-03 DIAGNOSIS — G4733 Obstructive sleep apnea (adult) (pediatric): Secondary | ICD-10-CM

## 2024-01-03 DIAGNOSIS — Z8679 Personal history of other diseases of the circulatory system: Secondary | ICD-10-CM | POA: Diagnosis not present

## 2024-01-03 DIAGNOSIS — R0789 Other chest pain: Secondary | ICD-10-CM | POA: Diagnosis not present

## 2024-01-03 LAB — PULMONARY FUNCTION TEST
FEF 25-75 Post: 1.99 L/s
FEF 25-75 Pre: 1.62 L/s
FEF2575-%Change-Post: 23 %
FEF2575-%Pred-Post: 97 %
FEF2575-%Pred-Pre: 79 %
FEV1-%Change-Post: 5 %
FEV1-%Pred-Post: 83 %
FEV1-%Pred-Pre: 79 %
FEV1-Post: 2.07 L
FEV1-Pre: 1.96 L
FEV1FVC-%Change-Post: 2 %
FEV1FVC-%Pred-Pre: 101 %
FEV6-%Change-Post: 3 %
FEV6-%Pred-Post: 84 %
FEV6-%Pred-Pre: 81 %
FEV6-Post: 2.61 L
FEV6-Pre: 2.52 L
FEV6FVC-%Pred-Post: 104 %
FEV6FVC-%Pred-Pre: 104 %
FVC-%Change-Post: 3 %
FVC-%Pred-Post: 80 %
FVC-%Pred-Pre: 78 %
FVC-Post: 2.61 L
FVC-Pre: 2.53 L
Post FEV1/FVC ratio: 79 %
Post FEV6/FVC ratio: 100 %
Pre FEV1/FVC ratio: 77 %
Pre FEV6/FVC Ratio: 100 %

## 2024-01-03 NOTE — Progress Notes (Signed)
 Pre/Post Spirometry Performed Today.

## 2024-01-03 NOTE — Patient Instructions (Signed)
 Pre/Post Spirometry Performed Today.

## 2024-01-03 NOTE — Patient Instructions (Signed)
 X schedule PFT  OK to use symbicort as needed only (after spring)  Let us know if you would like consultation with a sleep dentist for a mouthguard as an alternative to CPAP

## 2024-01-03 NOTE — Progress Notes (Signed)
 Subjective:    Patient ID: Kristin Wallace, female    DOB: 1956-07-24, 68 y.o.   MRN: [id number]  HPI 68 y.o. never smoker with asthma and obstructive sleep apnea.  On CPAP since 2024   PMH : A fib  Kristin Wallace, a patient with a history of asthma and sleep apnea, presents for a follow-up visit. She is currently on Symbicort and albuterol for her asthma and uses a CPAP machine for her sleep apnea. The patient reports an improvement in her asthma symptoms since moving from [place] to [place]. However, she experiences episodes of breathlessness, particularly when she is relaxing or talking. These episodes are different from her usual asthma symptoms and do not last long. She does not use her albuterol inhaler for these episodes as they usually resolve on their own. The patient also mentions that she has developed anxiety, especially when driving on high-speed roads. Regarding her sleep apnea, the patient uses her CPAP machine regularly and reports good results. However, she has been experiencing some issues with her mask, which she believes is causing a leak.  CPAP download shows good control of this on auto settings 5 to 15 cm with average pressure of 10 and maximum pressure of 12 cm.  She has moderate leak.  She is very compliant more than 7 hours every night without a single missed night.  CPAP is certainly helped improve her daytime somnolence and fatigue  Significant tests/ events reviewed  Spirometry 12/2023 did not show any airway obstruction, ratio 79, FEV1 83%  Chest imaging:  CT chest 12/18/20 >> apical scarring   Sleep testing:  HST 02/16/23 >> AHI 17.4, SpO2 low 87%  Auto CPAP 04/02/23 to 06/30/23 >> used on 89 of 90 nights with average 6 hrs 53 min.  Average AHI 4.1 with median CPAP 6 and 95 th percentile CPAP 11 cm H2O   Cardiac testing:  Echo 10/28/22 >> EF 50 to 55%, mild MR, mild LA dilation   Review of Systems neg for any significant sore throat, dysphagia, itching, sneezing, nasal  congestion or excess/ purulent secretions, fever, chills, sweats, unintended wt loss, pleuritic or exertional cp, hempoptysis, orthopnea pnd or change in chronic leg swelling. Also denies presyncope, palpitations, heartburn, abdominal pain, nausea, vomiting, diarrhea or change in bowel or urinary habits, dysuria,hematuria, rash, arthralgias, visual complaints, headache, numbness weakness or ataxia.     Objective:   Physical Exam  Gen. Pleasant, well-nourished, in no distress ENT - no thrush, no pallor/icterus,no post nasal drip Neck: No JVD, no thyromegaly, no carotid bruits Lungs: no use of accessory muscles, no dullness to percussion, clear without rales or rhonchi  Cardiovascular: Rhythm regular, heart sounds  normal, no murmurs or gallops, no peripheral edema Musculoskeletal: No deformities, no cyanosis or clubbing         Assessment & Plan:    Asthma Asthma is well-controlled, primarily triggered by strong odors, cigarette smoke, and certain fragrances. Minimal use of albuterol and reduced Symbicort to one puff daily. Improved since eliminating dairy. Plan to wean off Symbicort after spring and use as needed. Emphasized trigger avoidance. - Wean off Symbicort after spring and use as needed -Spirometry does not show significant airway obstruction  Dyspnea Episodes of dyspnea unrelated to exertion, often during relaxation. Persistent since the pandemic, distinct from asthma. Differential includes long COVID or anxiety-related dyspnea. Cardiac causes evaluated by cardiologist. PFT will help rule out lung-related causes. -- Evaluate for anxiety as a contributing factor  OSA on CPAP On CPAP therapy (  5-15 cm H2O) with good compliance. Issues with mask leakage due to smaller nasal mask. History of acne with full-face mask. Discussed potential transition to dental device post-CPAP rental period. Dental devices may cost ~$1500 and insurance coverage varies. - Continue current CPAP  settings - Monitor mask fit and consider alternatives if leakage persists - Discuss potential transition to dental device post-CPAP rental period - Discussed cost-effective options and potential insurance coverage for dental devices for sleep apnea.  Follow-up - Schedule follow-up visit in 1 year - Coordinate future appointments to accommodate her location preference.

## 2024-01-04 ENCOUNTER — Telehealth: Payer: Self-pay | Admitting: Family Medicine

## 2024-01-04 NOTE — Telephone Encounter (Signed)
 Appt made for 01/24/2024

## 2024-01-05 DIAGNOSIS — M25641 Stiffness of right hand, not elsewhere classified: Secondary | ICD-10-CM | POA: Diagnosis not present

## 2024-01-05 DIAGNOSIS — M6281 Muscle weakness (generalized): Secondary | ICD-10-CM | POA: Diagnosis not present

## 2024-01-05 DIAGNOSIS — R202 Paresthesia of skin: Secondary | ICD-10-CM | POA: Diagnosis not present

## 2024-01-05 DIAGNOSIS — T1490XS Injury, unspecified, sequela: Secondary | ICD-10-CM | POA: Diagnosis not present

## 2024-01-05 DIAGNOSIS — M25642 Stiffness of left hand, not elsewhere classified: Secondary | ICD-10-CM | POA: Diagnosis not present

## 2024-01-05 DIAGNOSIS — M154 Erosive (osteo)arthritis: Secondary | ICD-10-CM | POA: Diagnosis not present

## 2024-01-10 DIAGNOSIS — M25642 Stiffness of left hand, not elsewhere classified: Secondary | ICD-10-CM | POA: Diagnosis not present

## 2024-01-10 DIAGNOSIS — M546 Pain in thoracic spine: Secondary | ICD-10-CM | POA: Diagnosis not present

## 2024-01-10 DIAGNOSIS — M6281 Muscle weakness (generalized): Secondary | ICD-10-CM | POA: Diagnosis not present

## 2024-01-10 DIAGNOSIS — M542 Cervicalgia: Secondary | ICD-10-CM | POA: Diagnosis not present

## 2024-01-10 DIAGNOSIS — M9902 Segmental and somatic dysfunction of thoracic region: Secondary | ICD-10-CM | POA: Diagnosis not present

## 2024-01-10 DIAGNOSIS — M154 Erosive (osteo)arthritis: Secondary | ICD-10-CM | POA: Diagnosis not present

## 2024-01-10 DIAGNOSIS — R202 Paresthesia of skin: Secondary | ICD-10-CM | POA: Diagnosis not present

## 2024-01-10 DIAGNOSIS — M9903 Segmental and somatic dysfunction of lumbar region: Secondary | ICD-10-CM | POA: Diagnosis not present

## 2024-01-10 DIAGNOSIS — M9901 Segmental and somatic dysfunction of cervical region: Secondary | ICD-10-CM | POA: Diagnosis not present

## 2024-01-10 DIAGNOSIS — M25641 Stiffness of right hand, not elsewhere classified: Secondary | ICD-10-CM | POA: Diagnosis not present

## 2024-01-10 DIAGNOSIS — T1490XS Injury, unspecified, sequela: Secondary | ICD-10-CM | POA: Diagnosis not present

## 2024-01-11 DIAGNOSIS — L03032 Cellulitis of left toe: Secondary | ICD-10-CM | POA: Diagnosis not present

## 2024-01-11 DIAGNOSIS — M79675 Pain in left toe(s): Secondary | ICD-10-CM | POA: Diagnosis not present

## 2024-01-11 DIAGNOSIS — L600 Ingrowing nail: Secondary | ICD-10-CM | POA: Diagnosis not present

## 2024-01-11 DIAGNOSIS — M79672 Pain in left foot: Secondary | ICD-10-CM | POA: Diagnosis not present

## 2024-01-12 DIAGNOSIS — M25642 Stiffness of left hand, not elsewhere classified: Secondary | ICD-10-CM | POA: Diagnosis not present

## 2024-01-12 DIAGNOSIS — R202 Paresthesia of skin: Secondary | ICD-10-CM | POA: Diagnosis not present

## 2024-01-12 DIAGNOSIS — M6281 Muscle weakness (generalized): Secondary | ICD-10-CM | POA: Diagnosis not present

## 2024-01-12 DIAGNOSIS — M25641 Stiffness of right hand, not elsewhere classified: Secondary | ICD-10-CM | POA: Diagnosis not present

## 2024-01-12 DIAGNOSIS — M154 Erosive (osteo)arthritis: Secondary | ICD-10-CM | POA: Diagnosis not present

## 2024-01-12 DIAGNOSIS — T1490XS Injury, unspecified, sequela: Secondary | ICD-10-CM | POA: Diagnosis not present

## 2024-01-13 DIAGNOSIS — G4733 Obstructive sleep apnea (adult) (pediatric): Secondary | ICD-10-CM | POA: Diagnosis not present

## 2024-01-17 ENCOUNTER — Encounter: Payer: 59 | Admitting: *Deleted

## 2024-01-17 ENCOUNTER — Encounter (HOSPITAL_COMMUNITY)
Admission: RE | Admit: 2024-01-17 | Discharge: 2024-01-17 | Disposition: A | Discharge status: Home / Self Care | Payer: 59 | Source: Ambulatory Visit | Attending: Internal Medicine | Admitting: Internal Medicine

## 2024-01-17 VITALS — BP 116/59 | HR 70 | Temp 97.8°F | Resp 16

## 2024-01-17 DIAGNOSIS — M25642 Stiffness of left hand, not elsewhere classified: Secondary | ICD-10-CM | POA: Diagnosis not present

## 2024-01-17 DIAGNOSIS — M810 Age-related osteoporosis without current pathological fracture: Secondary | ICD-10-CM

## 2024-01-17 DIAGNOSIS — G4733 Obstructive sleep apnea (adult) (pediatric): Secondary | ICD-10-CM | POA: Diagnosis not present

## 2024-01-17 DIAGNOSIS — T1490XS Injury, unspecified, sequela: Secondary | ICD-10-CM | POA: Diagnosis not present

## 2024-01-17 DIAGNOSIS — R202 Paresthesia of skin: Secondary | ICD-10-CM | POA: Diagnosis not present

## 2024-01-17 DIAGNOSIS — M6281 Muscle weakness (generalized): Secondary | ICD-10-CM | POA: Diagnosis not present

## 2024-01-17 DIAGNOSIS — M25641 Stiffness of right hand, not elsewhere classified: Secondary | ICD-10-CM | POA: Diagnosis not present

## 2024-01-17 DIAGNOSIS — M154 Erosive (osteo)arthritis: Secondary | ICD-10-CM | POA: Diagnosis not present

## 2024-01-17 MED ORDER — ZOLEDRONIC ACID 5 MG/100ML IV SOLN
5.0000 mg | Freq: Once | INTRAVENOUS | Status: AC
  Administered 2024-01-17: 5 mg via INTRAVENOUS

## 2024-01-17 NOTE — Progress Notes (Signed)
 Diagnosis: Osteoporosis  Provider:  Mack Hook MD  Procedure: IV Infusion  IV Type: Peripheral, IV Location: L Antecubital  Reclast (Zolendronic Acid), Dose: 5 mg  Infusion Start Time: 1318  Infusion Stop Time: 1348  Post Infusion IV Care: Observation period completed  Discharge: Condition: Good, Destination: Home . AVS Provided  Performed by:  Daleen Squibb, RN

## 2024-01-19 DIAGNOSIS — T1490XS Injury, unspecified, sequela: Secondary | ICD-10-CM | POA: Diagnosis not present

## 2024-01-19 DIAGNOSIS — M25641 Stiffness of right hand, not elsewhere classified: Secondary | ICD-10-CM | POA: Diagnosis not present

## 2024-01-19 DIAGNOSIS — M154 Erosive (osteo)arthritis: Secondary | ICD-10-CM | POA: Diagnosis not present

## 2024-01-19 DIAGNOSIS — R202 Paresthesia of skin: Secondary | ICD-10-CM | POA: Diagnosis not present

## 2024-01-19 DIAGNOSIS — M6281 Muscle weakness (generalized): Secondary | ICD-10-CM | POA: Diagnosis not present

## 2024-01-19 DIAGNOSIS — M25642 Stiffness of left hand, not elsewhere classified: Secondary | ICD-10-CM | POA: Diagnosis not present

## 2024-01-20 ENCOUNTER — Telehealth: Payer: Self-pay | Admitting: Family Medicine

## 2024-01-20 DIAGNOSIS — Z1231 Encounter for screening mammogram for malignant neoplasm of breast: Secondary | ICD-10-CM

## 2024-01-20 NOTE — Telephone Encounter (Signed)
 Copied from CRM [id number]. Topic: Clinical - Request for Lab/Test Order >> Jan 20, 2024  2:53 PM Kristin Wallace wrote: Reason for CRM: Kristin Wallace called in because she received a letter for a mammogram follow up stating she is due for the yearly check on 04/10 and to contact primary office. Kristin Wallace is looking to get mammogram redone at same place details below:  [facility] [address],  [place], [place] [address] Phone: [phone]   Kristin Wallace callback [phone]

## 2024-01-20 NOTE — Telephone Encounter (Signed)
 Called and spoke to patient - she will call to schedule appt - I faxed over orders

## 2024-01-24 ENCOUNTER — Other Ambulatory Visit: Payer: Self-pay | Admitting: Family Medicine

## 2024-01-24 ENCOUNTER — Other Ambulatory Visit (INDEPENDENT_AMBULATORY_CARE_PROVIDER_SITE_OTHER)

## 2024-01-24 DIAGNOSIS — Z780 Asymptomatic menopausal state: Secondary | ICD-10-CM

## 2024-01-25 DIAGNOSIS — Z780 Asymptomatic menopausal state: Secondary | ICD-10-CM | POA: Diagnosis not present

## 2024-01-25 DIAGNOSIS — M79672 Pain in left foot: Secondary | ICD-10-CM | POA: Diagnosis not present

## 2024-01-25 DIAGNOSIS — L03032 Cellulitis of left toe: Secondary | ICD-10-CM | POA: Diagnosis not present

## 2024-01-25 DIAGNOSIS — M810 Age-related osteoporosis without current pathological fracture: Secondary | ICD-10-CM | POA: Diagnosis not present

## 2024-01-25 DIAGNOSIS — M79675 Pain in left toe(s): Secondary | ICD-10-CM | POA: Diagnosis not present

## 2024-01-26 ENCOUNTER — Encounter: Payer: Self-pay | Admitting: Family Medicine

## 2024-01-26 ENCOUNTER — Other Ambulatory Visit: Payer: Self-pay | Admitting: Internal Medicine

## 2024-01-26 DIAGNOSIS — M154 Erosive (osteo)arthritis: Secondary | ICD-10-CM

## 2024-01-26 DIAGNOSIS — Z79899 Other long term (current) drug therapy: Secondary | ICD-10-CM

## 2024-01-26 NOTE — Telephone Encounter (Signed)
 Last Fill: 11/16/2023  Labs: 11/11/2023 CBC/CMP WNL  Next Visit: 04/03/2024  Last Visit: 09/27/2023  DX: Erosive osteoarthritis of both hands   Current Dose per office note 09/27/2023: not discussed  Okay to refill Celebrex?

## 2024-01-27 DIAGNOSIS — M810 Age-related osteoporosis without current pathological fracture: Secondary | ICD-10-CM | POA: Diagnosis not present

## 2024-01-27 DIAGNOSIS — R519 Headache, unspecified: Secondary | ICD-10-CM | POA: Diagnosis not present

## 2024-01-27 DIAGNOSIS — R42 Dizziness and giddiness: Secondary | ICD-10-CM | POA: Diagnosis not present

## 2024-01-27 DIAGNOSIS — S098XXA Other specified injuries of head, initial encounter: Secondary | ICD-10-CM | POA: Diagnosis not present

## 2024-01-27 DIAGNOSIS — G629 Polyneuropathy, unspecified: Secondary | ICD-10-CM | POA: Diagnosis not present

## 2024-01-27 DIAGNOSIS — I4891 Unspecified atrial fibrillation: Secondary | ICD-10-CM | POA: Diagnosis not present

## 2024-01-27 DIAGNOSIS — J45909 Unspecified asthma, uncomplicated: Secondary | ICD-10-CM | POA: Diagnosis not present

## 2024-01-27 DIAGNOSIS — W1839XA Other fall on same level, initial encounter: Secondary | ICD-10-CM | POA: Diagnosis not present

## 2024-01-27 DIAGNOSIS — M1990 Unspecified osteoarthritis, unspecified site: Secondary | ICD-10-CM | POA: Diagnosis not present

## 2024-01-27 NOTE — Addendum Note (Signed)
 Addended by: Henriette Combs on: 01/27/2024 10:18 AM   Modules accepted: Orders

## 2024-01-31 ENCOUNTER — Other Ambulatory Visit: Payer: Self-pay

## 2024-01-31 DIAGNOSIS — N6322 Unspecified lump in the left breast, upper inner quadrant: Secondary | ICD-10-CM

## 2024-02-03 ENCOUNTER — Other Ambulatory Visit: Payer: Self-pay | Admitting: Gastroenterology

## 2024-02-03 DIAGNOSIS — J4520 Mild intermittent asthma, uncomplicated: Secondary | ICD-10-CM

## 2024-02-03 DIAGNOSIS — K2100 Gastro-esophageal reflux disease with esophagitis, without bleeding: Secondary | ICD-10-CM

## 2024-02-04 ENCOUNTER — Ambulatory Visit: Admitting: Family Medicine

## 2024-02-04 ENCOUNTER — Encounter: Payer: Self-pay | Admitting: Family Medicine

## 2024-02-04 VITALS — BP 135/73 | HR 66 | Temp 97.2°F | Ht 67.5 in | Wt 127.6 lb

## 2024-02-04 DIAGNOSIS — F0781 Postconcussional syndrome: Secondary | ICD-10-CM

## 2024-02-04 NOTE — Progress Notes (Signed)
 Subjective:  Patient ID: Kristin Wallace, female    DOB: 12/11/1955, 68 y.o.   MRN: [id number]  Patient Care Team: Sonny Masters, FNP as PCP - General (Family Medicine) Ernesto Rutherford, MD as Referring Physician (Ophthalmology)   Chief Complaint:  Follow-up (ER follow up Concussion)   HPI: Kristin Wallace is a 68 y.o. female presenting on 02/04/2024 for Follow-up (ER follow up Concussion)   Discussed the use of AI scribe software for clinical note transcription with the patient, who gave verbal consent to proceed.  History of Present Illness   Kristin Wallace is a 68 year old female who presents with symptoms following a concussion.  She experienced a fall resulting in a concussion on January 27, 2024. A visit to the emergency room on January 28, 2024, included a CT scan, EKG, and blood work, all of which were normal. Since the incident, she has felt lightheaded, disoriented, and easily fatigued, especially during activities. Symptoms worsened by March 29, and she has not felt well since.  She describes feeling lightheaded intermittently and extremely fatigued with minimal exertion, such as sweeping the floors. She often needs to lie down after attempting more strenuous activities. She has been trying to limit screen time and reading, although she occasionally checks news headlines online. She does not own a TV.  Her sleep pattern is mostly consistent with seven to nine hours per night, except for one night of difficulty returning to sleep after waking. She uses a CPAP machine and typically returns to sleep easily after waking. She has taken approximately three naps since the concussion but often just rests without falling asleep.  She drinks two cups of coffee in the morning and sometimes in the afternoon, but she does not consume alcohol.  She expressed concern about an abnormal creatinine level noted in her hospital records. The creatinine level was 0.4, which is slightly low.           Relevant past medical, surgical, family, and social history reviewed and updated as indicated.  Allergies and medications reviewed and updated. Data reviewed: Chart in Epic.   Past Medical History:  Diagnosis Date   Allergy 1970?   shellfish   Anxiety ?   Arthritis    erosive osteoarthritis   Asthma    Atrial fibrillation (HCC)    Cataract    They were removed   Chronically dry eyes    Fatigue    chronic ( and weakness)   GERD (gastroesophageal reflux disease)    Lyme disease    Neuromuscular disorder (HCC)    neuropathy   Neuropathy    legs and feet   Osteoporosis    Sleep apnea 03/2023   Uses continuous positive airway pressure (CPAP) ventilation at home     Past Surgical History:  Procedure Laterality Date   ABDOMINAL HYSTERECTOMY     BREAST SURGERY Left    neg tumor   COLONOSCOPY     COLONOSCOPY WITH PROPOFOL N/A 07/07/2022   Procedure: COLONOSCOPY WITH PROPOFOL;  Surgeon: Lanelle Bal, DO;  Location: [facility];  Service: Endoscopy;  Laterality: N/A;  1:15pm, ASA 2, per office pt can't move up   ESOPHAGOGASTRODUODENOSCOPY     EYE SURGERY  ?   torn retina, cataracts   POLYPECTOMY  07/07/2022   Procedure: POLYPECTOMY;  Surgeon: Lanelle Bal, DO;  Location: [facility];  Service: Endoscopy;;   RIGHT AND LEFT HEART CATH     TUBAL LIGATION  Social History   Socioeconomic History   Marital status: Divorced    Spouse name: Not on file   Number of children: 3   Years of education: Not on file   Highest education level: GED or equivalent  Occupational History   Occupation: retired  Tobacco Use   Smoking status: Never    Passive exposure: Never   Smokeless tobacco: Never  Vaping Use   Vaping status: Never Used  Substance and Sexual Activity   Alcohol use: Yes    Comment: Occasionally drink small amounts   Drug use: Never   Sexual activity: Not Currently  Other Topics Concern   Not on file  Social History Narrative   Moved here  from [place] this year 07/27/2021.   Daughter lives with her   Right handed   Social Drivers of Health   Financial Resource Strain: Low Risk  (12/09/2023)   Overall Financial Resource Strain (CARDIA)    Difficulty of Paying Living Expenses: Not hard at all  Recent Concern: Financial Resource Strain - High Risk (11/04/2023)   Overall Financial Resource Strain (CARDIA)    Difficulty of Paying Living Expenses: Hard  Food Insecurity: No Food Insecurity (12/09/2023)   Hunger Vital Sign    Worried About Running Out of Food in the Last Year: Never true    Ran Out of Food in the Last Year: Never true  Recent Concern: Food Insecurity - Food Insecurity Present (11/04/2023)   Hunger Vital Sign    Worried About Running Out of Food in the Last Year: Sometimes true    Ran Out of Food in the Last Year: Sometimes true  Transportation Needs: No Transportation Needs (12/09/2023)   PRAPARE - Administrator, Civil Service (Medical): No    Lack of Transportation (Non-Medical): No  Physical Activity: Sufficiently Active (12/09/2023)   Exercise Vital Sign    Days of Exercise per Week: 5 days    Minutes of Exercise per Session: 30 min  Stress: No Stress Concern Present (12/09/2023)   [employer] of Occupational Health - Occupational Stress Questionnaire    Feeling of Stress : Only a little  Recent Concern: Stress - Stress Concern Present (11/04/2023)   [employer] of Occupational Health - Occupational Stress Questionnaire    Feeling of Stress : To some extent  Social Connections: Moderately Integrated (12/09/2023)   Social Connection and Isolation Panel [NHANES]    Frequency of Communication with Friends and Family: More than three times a week    Frequency of Social Gatherings with Friends and Family: Three times a week    Attends Religious Services: More than 4 times per year    Active Member of Clubs or Organizations: Yes    Attends Banker Meetings: More than 4 times per year     Marital Status: Divorced  Intimate Partner Violence: Not At Risk (12/09/2023)   Humiliation, Afraid, Rape, and Kick questionnaire    Fear of Current or Ex-Partner: No    Emotionally Abused: No    Physically Abused: No    Sexually Abused: No    Outpatient Encounter Medications as of 02/04/2024  Medication Sig   albuterol (VENTOLIN HFA) 108 (90 Base) MCG/ACT inhaler Inhale into the lungs.   Ascorbic Acid (VITAMIN C) 1000 MG tablet Take 1,000 mg by mouth in the morning.   budesonide-formoterol (SYMBICORT) 160-4.5 MCG/ACT inhaler Inhale 2 puffs into the lungs 2 (two) times daily.   Calcium Carb-Cholecalciferol (CALCIUM 600+D3 PO) Take 1 tablet by  mouth every morning.   celecoxib (CELEBREX) 100 MG capsule TAKE 1 CAPSULE BY MOUTH ONCE  DAILY AS NEEDED   Cholecalciferol (VITAMIN D3) 125 MCG (5000 UT) CAPS Take 5,000 Units by mouth in the morning.   CINNAMON PO Take 1 Dose by mouth See admin instructions. 1 teaspoonful twice daily with 2 teaspoonful of honey   cycloSPORINE (RESTASIS) 0.05 % ophthalmic emulsion Place 2 drops into both eyes 2 (two) times daily.   Multiple Vitamins-Minerals (MULTIVITAMIN WOMEN 50+) TABS Take 1 tablet by mouth in the morning.   pantoprazole (PROTONIX) 40 MG tablet TAKE 1 TABLET BY MOUTH DAILY   pregabalin (LYRICA) 75 MG capsule Take 1 capsule (75 mg total) by mouth 2 (two) times daily.   PSYLLIUM HUSK PO Take 1 Dose by mouth in the morning.   SODIUM FLUORIDE 5000 PPM 1.1 % PSTE Take by mouth.   TURMERIC CURCUMIN PO Take 1 capsule by mouth in the morning and at bedtime. W/Ginger Powder   zoledronic acid (RECLAST) 5 MG/100ML SOLN injection Inject into the vein.   No facility-administered encounter medications on file as of 02/04/2024.    Allergies  Allergen Reactions   Other Shortness Of Breath    Dairy   Shellfish Allergy Other (See Comments) and Nausea And Vomiting    Vomiting    Pertinent ROS per HPI, otherwise unremarkable      Objective:  BP 135/73    Pulse 66   Temp (!) 97.2 F (36.2 C) (Temporal)   Ht 5' 7.5" (1.715 m)   Wt 127 lb 9.6 oz (57.9 kg)   SpO2 98%   BMI 19.69 kg/m    Wt Readings from Last 3 Encounters:  02/04/24 127 lb 9.6 oz (57.9 kg)  01/03/24 127 lb (57.6 kg)  01/03/24 127 lb 11.2 oz (57.9 kg)    Physical Exam Vitals and nursing note reviewed.  Constitutional:      General: She is not in acute distress.    Appearance: Normal appearance. She is normal weight. She is not ill-appearing, toxic-appearing or diaphoretic.  HENT:     Head: Normocephalic and atraumatic.     Nose: Nose normal.     Mouth/Throat:     Mouth: Mucous membranes are moist.     Pharynx: Oropharynx is clear.  Eyes:     Conjunctiva/sclera: Conjunctivae normal.     Pupils: Pupils are equal, round, and reactive to light.  Cardiovascular:     Rate and Rhythm: Normal rate and regular rhythm.     Heart sounds: Normal heart sounds.  Pulmonary:     Effort: Pulmonary effort is normal.     Breath sounds: Normal breath sounds.  Musculoskeletal:     Cervical back: Neck supple.     Right lower leg: No edema.     Left lower leg: No edema.  Skin:    General: Skin is warm and dry.     Capillary Refill: Capillary refill takes less than 2 seconds.  Neurological:     General: No focal deficit present.     Mental Status: She is alert and oriented to person, place, and time.     Cranial Nerves: No cranial nerve deficit.     Sensory: No sensory deficit.     Motor: No weakness.     Gait: Gait abnormal (using cane).  Psychiatric:        Mood and Affect: Mood normal.        Behavior: Behavior normal.  Thought Content: Thought content normal.        Judgment: Judgment normal.     Results for orders placed or performed in visit on 01/03/24  Pulmonary function test   Collection Time: 01/03/24  2:09 PM  Result Value Ref Range   FVC-Pre 2.53 L   FVC-%Pred-Pre 78 %   FVC-Post 2.61 L   FVC-%Pred-Post 80 %   FVC-%Change-Post 3 %   FEV1-Pre 1.96  L   FEV1-%Pred-Pre 79 %   FEV1-Post 2.07 L   FEV1-%Pred-Post 83 %   FEV1-%Change-Post 5 %   FEV6-Pre 2.52 L   FEV6-%Pred-Pre 81 %   FEV6-Post 2.61 L   FEV6-%Pred-Post 84 %   FEV6-%Change-Post 3 %   Pre FEV1/FVC ratio 77 %   FEV1FVC-%Pred-Pre 101 %   Post FEV1/FVC ratio 79 %   FEV1FVC-%Change-Post 2 %   Pre FEV6/FVC Ratio 100 %   FEV6FVC-%Pred-Pre 104 %   Post FEV6/FVC ratio 100 %   FEV6FVC-%Pred-Post 104 %   FEF 25-75 Pre 1.62 L/sec   FEF2575-%Pred-Pre 79 %   FEF 25-75 Post 1.99 L/sec   FEF2575-%Pred-Post 97 %   FEF2575-%Change-Post 23 %       Pertinent labs & imaging results that were available during my care of the patient were reviewed by me and considered in my medical decision making.  Assessment & Plan:  Cheynne was seen today for follow-up.  Diagnoses and all orders for this visit:  Post concussion syndrome Sustained a concussion on January 27, 2024, with persistent symptoms of lightheadedness, disorientation, and fatigue. Recovery may take weeks to months. Emphasized brain rest, minimizing screen time and reading, adequate sleep, and hydration. Advised avoiding activities requiring significant cognitive effort and listening to her body's cues for rest. Consider therapy if symptoms persist or worsen. - Advise 24-hour hiatus from screens (phone, TV, Nook, Kindle) - Encourage 7-9 hours of sleep nightly - Limit activities requiring significant cognitive effort - Advise rest and hydration - Limit stimulants and alcohol - Use acetaminophen for headache if needed - Provide handout on concussion management - Instruct to report if symptoms persist or worsen  Creatinine level concern Addressed concern about a slightly low creatinine level of 0.4 in hospital records. Explained that elevated creatinine levels are more indicative of kidney dysfunction. - Reassure that creatinine level is not concerning          Continue all other maintenance medications.  Follow up  plan: Return if symptoms worsen or fail to improve.   Continue healthy lifestyle choices, including diet (rich in fruits, vegetables, and lean proteins, and low in salt and simple carbohydrates) and exercise (at least 30 minutes of moderate physical activity daily).  Educational handout given for post-concussion syndrome   The above assessment and management plan was discussed with the patient. The patient verbalized understanding of and has agreed to the management plan. Patient is aware to call the clinic if they develop any new symptoms or if symptoms persist or worsen. Patient is aware when to return to the clinic for a follow-up visit. Patient educated on when it is appropriate to go to the emergency department.   Kari Baars, FNP-C [facility] [phone]

## 2024-02-07 DIAGNOSIS — M9903 Segmental and somatic dysfunction of lumbar region: Secondary | ICD-10-CM | POA: Diagnosis not present

## 2024-02-07 DIAGNOSIS — M9901 Segmental and somatic dysfunction of cervical region: Secondary | ICD-10-CM | POA: Diagnosis not present

## 2024-02-07 DIAGNOSIS — M546 Pain in thoracic spine: Secondary | ICD-10-CM | POA: Diagnosis not present

## 2024-02-07 DIAGNOSIS — M542 Cervicalgia: Secondary | ICD-10-CM | POA: Diagnosis not present

## 2024-02-07 DIAGNOSIS — M9902 Segmental and somatic dysfunction of thoracic region: Secondary | ICD-10-CM | POA: Diagnosis not present

## 2024-02-08 ENCOUNTER — Other Ambulatory Visit (HOSPITAL_BASED_OUTPATIENT_CLINIC_OR_DEPARTMENT_OTHER): Payer: Self-pay

## 2024-02-08 DIAGNOSIS — L600 Ingrowing nail: Secondary | ICD-10-CM | POA: Diagnosis not present

## 2024-02-08 DIAGNOSIS — M79671 Pain in right foot: Secondary | ICD-10-CM | POA: Diagnosis not present

## 2024-02-08 DIAGNOSIS — M79675 Pain in left toe(s): Secondary | ICD-10-CM | POA: Diagnosis not present

## 2024-02-08 DIAGNOSIS — M79674 Pain in right toe(s): Secondary | ICD-10-CM | POA: Diagnosis not present

## 2024-02-08 DIAGNOSIS — I739 Peripheral vascular disease, unspecified: Secondary | ICD-10-CM | POA: Diagnosis not present

## 2024-02-08 DIAGNOSIS — L609 Nail disorder, unspecified: Secondary | ICD-10-CM | POA: Diagnosis not present

## 2024-02-08 DIAGNOSIS — M79672 Pain in left foot: Secondary | ICD-10-CM | POA: Diagnosis not present

## 2024-02-08 DIAGNOSIS — L110 Acquired keratosis follicularis: Secondary | ICD-10-CM | POA: Diagnosis not present

## 2024-02-08 MED ORDER — BUDESONIDE-FORMOTEROL FUMARATE 160-4.5 MCG/ACT IN AERO: 2.0000 | INHALATION_SPRAY | Freq: Two times a day (BID) | RESPIRATORY_TRACT | 2 refills | Status: DC

## 2024-02-13 DIAGNOSIS — G4733 Obstructive sleep apnea (adult) (pediatric): Secondary | ICD-10-CM | POA: Diagnosis not present

## 2024-02-14 ENCOUNTER — Telehealth (HOSPITAL_BASED_OUTPATIENT_CLINIC_OR_DEPARTMENT_OTHER): Payer: Self-pay | Admitting: Pulmonary Disease

## 2024-02-14 DIAGNOSIS — G4733 Obstructive sleep apnea (adult) (pediatric): Secondary | ICD-10-CM

## 2024-02-14 NOTE — Telephone Encounter (Unsigned)
 Copied from CRM [id number]. Topic: Referral - Request for Referral >> Feb 14, 2024  9:46 AM Margarette Shawl wrote: Did the patient discuss referral with their provider in the last year? Yes (If No - schedule appointment) (If Yes - send message)  Appointment offered? No  Type of order/referral and detailed reason for visit: Sleep apnea dentist  Start process of getting mouthguard for sleep apnea. Spoke with provider at last visit(01/03/2024)about mouth guard and was told to contact clinic if she was interested, to start process of referral. Would like to begin use of mouthguard on 03/28/2024 and is agreeable to use for 1 year.   Preference of office, provider, location: N/A  If referral order, have you been seen by this specialty before? No (If Yes, this issue or another issue? When? Where?  Can we respond through MyChart? Yes

## 2024-02-15 NOTE — Telephone Encounter (Signed)
 Order placed

## 2024-02-17 DIAGNOSIS — M79675 Pain in left toe(s): Secondary | ICD-10-CM | POA: Diagnosis not present

## 2024-02-17 DIAGNOSIS — L03032 Cellulitis of left toe: Secondary | ICD-10-CM | POA: Diagnosis not present

## 2024-02-17 DIAGNOSIS — M79672 Pain in left foot: Secondary | ICD-10-CM | POA: Diagnosis not present

## 2024-02-21 ENCOUNTER — Telehealth (INDEPENDENT_AMBULATORY_CARE_PROVIDER_SITE_OTHER): Admitting: Family Medicine

## 2024-02-21 ENCOUNTER — Encounter: Payer: Self-pay | Admitting: Family Medicine

## 2024-02-21 DIAGNOSIS — F0781 Postconcussional syndrome: Secondary | ICD-10-CM | POA: Diagnosis not present

## 2024-02-21 DIAGNOSIS — G4701 Insomnia due to medical condition: Secondary | ICD-10-CM

## 2024-02-21 MED ORDER — TRAZODONE HCL 150 MG PO TABS: ORAL_TABLET | ORAL | 5 refills | Status: DC

## 2024-02-21 NOTE — Patient Instructions (Signed)
 Insomnia Insomnia is a sleep disorder that makes it difficult to fall asleep or stay asleep. Insomnia can cause fatigue, low energy, difficulty concentrating, mood swings, and poor performance at work or school. There are three different ways to classify insomnia: Difficulty falling asleep. Difficulty staying asleep. Waking up too early in the morning. Any type of insomnia can be long-term (chronic) or short-term (acute). Both are common. Short-term insomnia usually lasts for 3 months or less. Chronic insomnia occurs at least three times a week for longer than 3 months. What are the causes? Insomnia may be caused by another condition, situation, or substance, such as: Having certain mental health conditions, such as anxiety and depression. Using caffeine, alcohol, tobacco, or drugs. Having gastrointestinal conditions, such as gastroesophageal reflux disease (GERD). Having certain medical conditions. These include: Asthma. Alzheimer's disease. Stroke. Chronic pain. An overactive thyroid gland (hyperthyroidism). Other sleep disorders, such as restless legs syndrome and sleep apnea. Menopause. Sometimes, the cause of insomnia may not be known. What increases the risk? Risk factors for insomnia include: Gender. Females are affected more often than males. Age. Insomnia is more common as people get older. Stress and certain medical and mental health conditions. Lack of exercise. Having an irregular work schedule. This may include working night shifts and traveling between different time zones. What are the signs or symptoms? If you have insomnia, the main symptom is having trouble falling asleep or having trouble staying asleep. This may lead to other symptoms, such as: Feeling tired or having low energy. Feeling nervous about going to sleep. Not feeling rested in the morning. Having trouble concentrating. Feeling irritable, anxious, or depressed. How is this diagnosed? This condition  may be diagnosed based on: Your symptoms and medical history. Your health care provider may ask about: Your sleep habits. Any medical conditions you have. Your mental health. A physical exam. How is this treated? Treatment for insomnia depends on the cause. Treatment may focus on treating an underlying condition that is causing the insomnia. Treatment may also include: Medicines to help you sleep. Counseling or therapy. Lifestyle adjustments to help you sleep better. Follow these instructions at home: Eating and drinking  Limit or avoid alcohol, caffeinated beverages, and products that contain nicotine and tobacco, especially close to bedtime. These can disrupt your sleep. Do not eat a large meal or eat spicy foods right before bedtime. This can lead to digestive discomfort that can make it hard for you to sleep. Sleep habits  Keep a sleep diary to help you and your health care provider figure out what could be causing your insomnia. Write down: When you sleep. When you wake up during the night. How well you sleep and how rested you feel the next day. Any side effects of medicines you are taking. What you eat and drink. Make your bedroom a dark, comfortable place where it is easy to fall asleep. Put up shades or blackout curtains to block light from outside. Use a white noise machine to block noise. Keep the temperature cool. Limit screen use before bedtime. This includes: Not watching TV. Not using your smartphone, tablet, or computer. Stick to a routine that includes going to bed and waking up at the same times every day and night. This can help you fall asleep faster. Consider making a quiet activity, such as reading, part of your nighttime routine. Try to avoid taking naps during the day so that you sleep better at night. Get out of bed if you are still awake after  15 minutes of trying to sleep. Keep the lights down, but try reading or doing a quiet activity. When you feel  sleepy, go back to bed. General instructions Take over-the-counter and prescription medicines only as told by your health care provider. Exercise regularly as told by your health care provider. However, avoid exercising in the hours right before bedtime. Use relaxation techniques to manage stress. Ask your health care provider to suggest some techniques that may work well for you. These may include: Breathing exercises. Routines to release muscle tension. Visualizing peaceful scenes. Make sure that you drive carefully. Do not drive if you feel very sleepy. Keep all follow-up visits. This is important. Contact a health care provider if: You are tired throughout the day. You have trouble in your daily routine due to sleepiness. You continue to have sleep problems, or your sleep problems get worse. Get help right away if: You have thoughts about hurting yourself or someone else. Get help right away if you feel like you may hurt yourself or others, or have thoughts about taking your own life. Go to your nearest emergency room or: Call 911. Call the National Suicide Prevention Lifeline at (906)021-1611 or 988. This is open 24 hours a day. Text the Crisis Text Line at 325-069-2793. Summary Insomnia is a sleep disorder that makes it difficult to fall asleep or stay asleep. Insomnia can be long-term (chronic) or short-term (acute). Treatment for insomnia depends on the cause. Treatment may focus on treating an underlying condition that is causing the insomnia. Keep a sleep diary to help you and your health care provider figure out what could be causing your insomnia. This information is not intended to replace advice given to you by your health care provider. Make sure you discuss any questions you have with your health care provider. Document Revised: 09/29/2021 Document Reviewed: 09/29/2021 Elsevier Patient Education  2024 [employer].

## 2024-02-21 NOTE — Progress Notes (Signed)
 Subjective:    Patient ID: Kristin Wallace, female    DOB: 02-18-1956, 68 y.o.   MRN: [id number]   HPI: Kristin Wallace is a 68 y.o. female presenting for inability to sleep worsening ever since she suffered a traumatic concussion 3 to 4 weeks ago.  Her symptoms otherwise have been getting better.  Specifically headaches mental fog and photophobia are improving.  Last night she says she laid awake from 930 till midnight at least before she could get some sleep.  She has not had any relief by using Benadryl .      02/04/2024   11:50 AM 01/17/2024    1:13 PM 12/09/2023    3:49 PM 11/11/2023    3:13 PM 05/13/2023    2:23 PM  Depression screen PHQ 2/9  Decreased Interest 0 0 0 0 0  Down, Depressed, Hopeless 0 0 0 0 0  PHQ - 2 Score 0 0 0 0 0  Altered sleeping 2  0 0 0  Tired, decreased energy 3  2 2 2   Change in appetite 0  0 0 0  Feeling bad or failure about yourself  0  0 0 0  Trouble concentrating 3  0 0 0  Moving slowly or fidgety/restless 1  0 0 2  Suicidal thoughts 0  0 0 0  PHQ-9 Score 9  2 2 4   Difficult doing work/chores Somewhat difficult  Not difficult at all Not difficult at all Not difficult at all     Relevant past medical, surgical, family and social history reviewed and updated as indicated.  Interim medical history since our last visit reviewed. Allergies and medications reviewed and updated.  ROS:  Review of Systems  Constitutional: Negative.   HENT: Negative.    Respiratory:  Negative for shortness of breath.   Cardiovascular:  Negative for chest pain.  Gastrointestinal:  Negative for abdominal pain.  Musculoskeletal:  Negative for arthralgias.  Neurological:  Positive for headaches.     Social History   Tobacco Use  Smoking Status Never   Passive exposure: Never  Smokeless Tobacco Never       Objective:     Wt Readings from Last 3 Encounters:  02/04/24 127 lb 9.6 oz (57.9 kg)  01/03/24 127 lb (57.6 kg)  01/03/24 127 lb 11.2 oz (57.9 kg)     Exam  deferred. Video visit performed.   Assessment & Plan:  Insomnia due to medical condition  Post concussion syndrome  Other orders -     traZODone  HCl; Use from 1/3 to 1 tablet nightly as needed for sleep.  Dispense: 30 tablet; Refill: 5      Diagnoses and all orders for this visit:  Insomnia due to medical condition  Post concussion syndrome  Other orders -     traZODone  (DESYREL ) 150 MG tablet; Use from 1/3 to 1 tablet nightly as needed for sleep.    Virtual Visit  Note  I discussed the limitations, risks, security and privacy concerns of performing an evaluation and management service by video and the availability of in person appointments. The patient was identified with two identifiers. Pt.expressed understanding and agreed to proceed. Pt. Is at home. Dr. Veleta Gerold is in his office.  Follow Up Instructions:   I discussed the assessment and treatment plan with the patient. The patient was provided an opportunity to ask questions and all were answered. The patient agreed with the plan and demonstrated an understanding of the instructions.   The patient was  advised to call back or seek an in-person evaluation if the symptoms worsen or if the condition fails to improve as anticipated.   Total minutes contact time: 14    Follow up plan: Return if symptoms worsen or fail to improve.  Kristin Cleaver, MD [facility]

## 2024-02-22 ENCOUNTER — Ambulatory Visit: Payer: Self-pay

## 2024-02-22 MED ORDER — TRAZODONE HCL 50 MG PO TABS: 50.0000 mg | ORAL_TABLET | Freq: Every evening | ORAL | 3 refills | Status: DC | PRN

## 2024-02-22 NOTE — Telephone Encounter (Signed)
 Ok, pt can pick up new rx of Trazodone  and take 25 mg (1/2 tab) and see if this helps.

## 2024-02-22 NOTE — Telephone Encounter (Signed)
 Trazodone  50 mg Prescription sent to pharmacy, she can take 50-100 mg to see if this causes side effects.

## 2024-02-22 NOTE — Telephone Encounter (Signed)
 Patient aware and verbalizes understanding.

## 2024-02-22 NOTE — Addendum Note (Signed)
 Addended by: Tommas Fragmin A on: 02/22/2024 03:16 PM   Modules accepted: Orders

## 2024-02-22 NOTE — Telephone Encounter (Signed)
 Patient aware and states that the pill was 150mg  and she cut it into 3 pieces so she only took 50mg .  She slept good but she is still "disoriented"  but better. Please advise

## 2024-02-22 NOTE — Telephone Encounter (Signed)
   Chief Complaint: dizziness Symptoms: lightheaded, groggy, fatigue Frequency: this morning Pertinent Negatives: Patient denies fever, headache, rash, sob, numbness Disposition: [] ED /[] Urgent Care (no appt availability in office) / [] Appointment(In office/virtual)/ []  [facility]/ [] Home Care/ [] Refused Recommended Disposition /[] [facility]/ [x]  Follow-up with PCP Additional Notes: Patient reports she took her Trazadone last night for the first time. Patient reports she took the recommended dose of her medication but woke up this morning and is still feeling very groggy, fatigued, and describes a sensation of lightheadedness. Patient denies the room spinning or feeling like she's going to pass out, but states she feels very "run down". Patient believes this may be due to her medication from last night and is unsure if she should still continue taking it, if something else should be prescribed instead, or if she should take a smaller dose. Advised patient would forward request to appropriate person for follow-up. Advised to call back with worsening symptoms. Patient verbalized understanding.    Copied from CRM [id number]. Topic: Clinical - Red Word Triage >> Feb 22, 2024  2:32 PM Everlene Hobby D wrote: Patient feels disoriented, light headed and can't concentrate like she took the traZODone  (DESYREL ) 150 MG tablet but she didn't take it again. The first time she took it was last night a third of the tablet. Reason for Disposition  [1] MILD dizziness (e.g., walking normally) AND [2] has NOT been evaluated by doctor (or NP/PA) for this  (Exception: Dizziness caused by heat exposure, sudden standing, or poor fluid intake.)  Answer Assessment - Initial Assessment Questions 1. DESCRIPTION: "Describe your dizziness."     Light headed 2. LIGHTHEADED: "Do you feel lightheaded?" (e.g., somewhat faint, woozy, weak upon standing)     woozy 3. VERTIGO: "Do you feel like either you or the  room is spinning or tilting?" (i.e. vertigo)     denies 4. SEVERITY: "How bad is it?"  "Do you feel like you are going to faint?" "Can you stand and walk?"   - MILD: Feels slightly dizzy, but walking normally.   - MODERATE: Feels unsteady when walking, but not falling; interferes with normal activities (e.g., school, work).   - SEVERE: Unable to walk without falling, or requires assistance to walk without falling; feels like passing out now.      mild 5. ONSET:  "When did the dizziness begin?"     This morning 6. AGGRAVATING FACTORS: "Does anything make it worse?" (e.g., standing, change in head position)     Started trazodone  last night  7. HEART RATE: "Can you tell me your heart rate?" "How many beats in 15 seconds?"  (Note: not all patients can do this)       unsure 8. CAUSE: "What do you think is causing the dizziness?"     Related to trazadone last night 9. RECURRENT SYMPTOM: "Have you had dizziness before?" If Yes, ask: "When was the last time?" "What happened that time?"     denies 10. OTHER SYMPTOMS: "Do you have any other symptoms?" (e.g., fever, chest pain, vomiting, diarrhea, bleeding)       Can't concentrate, groggy  Protocols used: Dizziness - Lightheadedness-A-AH

## 2024-02-25 ENCOUNTER — Other Ambulatory Visit: Payer: Self-pay

## 2024-02-25 ENCOUNTER — Other Ambulatory Visit: Payer: Self-pay | Admitting: Family Medicine

## 2024-02-25 ENCOUNTER — Telehealth: Payer: Self-pay

## 2024-02-25 DIAGNOSIS — G4701 Insomnia due to medical condition: Secondary | ICD-10-CM

## 2024-02-25 MED ORDER — TRAZODONE HCL 50 MG PO TABS: 50.0000 mg | ORAL_TABLET | Freq: Every evening | ORAL | 3 refills | Status: AC | PRN

## 2024-02-25 NOTE — Progress Notes (Signed)
 Prescription re-sent to pharmacy.

## 2024-02-25 NOTE — Telephone Encounter (Signed)
 Copied from CRM [id number]. Topic: General - Other >> Feb 25, 2024 12:33 PM Emylou G wrote: Reason for CRM: patient called seemed very confused.. thought during her virtual that we would call in the traZODone  (DESYREL ) 50 MG tablet - due to the other mg was too strong? Her number is [phone] .Aaron Aas She is checking status

## 2024-02-29 ENCOUNTER — Encounter: Payer: Self-pay | Admitting: Nurse Practitioner

## 2024-02-29 ENCOUNTER — Ambulatory Visit (INDEPENDENT_AMBULATORY_CARE_PROVIDER_SITE_OTHER): Admitting: Nurse Practitioner

## 2024-02-29 VITALS — BP 106/64 | HR 77 | Temp 98.3°F | Ht 67.5 in | Wt 127.2 lb

## 2024-02-29 DIAGNOSIS — M79675 Pain in left toe(s): Secondary | ICD-10-CM

## 2024-02-29 NOTE — Progress Notes (Addendum)
 Acute Office Visit  Subjective:     Patient ID: Kristin Wallace, female    DOB: 04-Jun-1956, 68 y.o.   MRN: [id number]  Chief Complaint  Patient presents with   Toe Pain    Was running after her dog and after left toe was hurting     HPI Kristin Wallace is a 68 yrs old female present 02/29/2024 for an acute viist concerns for toe pain Left Big Toe Pain The patient is a 68 year old female presenting for an acute visit due to left big toe pain. She reports the onset of pain after running to catch her dog earlier today. Although she initially thought her left toe was affected, the pain is now localized to the left big toe. She describes the pain as sharp and throbbing, worse with walking or applying pressure, and associated with mild swelling. She denies any visible bruising, open wounds, or previous injury to the area. No numbness, tingling, or systemic symptoms such as fever or chills reported. 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 01/18/2022   GERD (gastroesophageal reflux disease) 11/03/2022   Paroxysmal atrial fibrillation (HCC) 05/06/2016   Sleep apnea 02/22/2023   Controlled substance agreement signed 11/11/2023   Pain of left great toe 02/29/2024   Resolved Ambulatory Problems    Diagnosis Date Noted   Gastroesophageal reflux disease with esophagitis without hemorrhage 10/13/2021   Establishing care with new doctor, encounter for 10/13/2021   Weakness 12/23/2021   Annual physical exam 12/24/2021   Episodic weakness/lightheadedness when upright few minutes better with drinking fluid 01/18/2022   Abnormal CT of the abdomen 06/10/2022   Past Medical History:   Diagnosis Date   Allergy 1970?   Anxiety ?   Arthritis    Asthma    Atrial fibrillation (HCC)    Cataract    Chronically dry eyes    Fatigue    Lyme disease    Neuromuscular disorder (HCC)    Neuropathy    Osteoporosis    Uses continuous positive airway pressure (CPAP) ventilation at home     ROS Negative unless indicated in HPI    Objective:    BP 106/64   Pulse 77   Temp 98.3 F (36.8 C) (Temporal)   Ht 5' 7.5" (1.715 m)   Wt 127 lb 3.2 oz (57.7 kg)   SpO2 96%   BMI 19.63 kg/m  BP Readings from Last 3 Encounters:  02/29/24 106/64  02/04/24 135/73  01/17/24 (!) 116/59   Wt Readings from Last 3 Encounters:  02/29/24 127 lb 3.2 oz (57.7 kg)  02/04/24 127 lb 9.6 oz (57.9 kg)  01/03/24 127 lb (57.6 kg)      Physical Exam Vitals and nursing note reviewed.  Constitutional:      General: She is not in acute distress. HENT:     Head: Normocephalic and atraumatic.     Nose: Nose normal.     Mouth/Throat:     Mouth: Mucous membranes are moist.  Eyes:     Extraocular Movements: Extraocular movements intact.     Conjunctiva/sclera: Conjunctivae normal.     Pupils: Pupils are equal, round, and reactive to light.  Cardiovascular:     Heart sounds:  Normal heart sounds.  Pulmonary:     Effort: Pulmonary effort is normal.     Breath sounds: Normal breath sounds.  Musculoskeletal:        General: Normal range of motion.     Right lower leg: No edema.     Left lower leg: No edema.     Comments: Left great toe pain  Skin:    General: Skin is warm and dry.  Neurological:     Mental Status: She is alert and oriented to person, place, and time.  Psychiatric:        Mood and Affect: Mood normal.        Behavior: Behavior normal.        Thought Content: Thought content normal.        Judgment: Judgment normal.     No results found for any visits on 02/29/24.      Assessment & Plan:  Pain of left great toe  And C is a 68 year old Caucasian female seen today  for left great toe pain, no acute distress Soaking foot in warm water with Epsom salt for 15-20 minutes once or twice daily to reduce inflammation and soothe discomfort (especially helpful for soft tissue pain, mild inflammation, ingrown toenails. F/u with Podiatry as already scheduled     The above assessment and management plan was discussed with the patient. The patient verbalized understanding of and has agreed to the management plan. Patient is aware to call the clinic if they develop any new symptoms or if symptoms persist or worsen. Patient is aware when to return to the clinic for a follow-up visit. Patient educated on when it is appropriate to go to the emergency department.  Return if symptoms worsen or fail to improve.  Kristin Beadles [place] Thompson, DNP [facility] [address] [place], [place] [address] [phone]  Note: This document was prepared by [employer] voice dictation technology and any errors that results from this process are unintentional.

## 2024-02-29 NOTE — Patient Instructions (Signed)
 Soak toe  for 15-20 minutes in warm water with [facility]

## 2024-03-06 DIAGNOSIS — N6322 Unspecified lump in the left breast, upper inner quadrant: Secondary | ICD-10-CM | POA: Diagnosis not present

## 2024-03-07 DIAGNOSIS — M9902 Segmental and somatic dysfunction of thoracic region: Secondary | ICD-10-CM | POA: Diagnosis not present

## 2024-03-07 DIAGNOSIS — M9903 Segmental and somatic dysfunction of lumbar region: Secondary | ICD-10-CM | POA: Diagnosis not present

## 2024-03-07 DIAGNOSIS — M546 Pain in thoracic spine: Secondary | ICD-10-CM | POA: Diagnosis not present

## 2024-03-07 DIAGNOSIS — M542 Cervicalgia: Secondary | ICD-10-CM | POA: Diagnosis not present

## 2024-03-07 DIAGNOSIS — M9901 Segmental and somatic dysfunction of cervical region: Secondary | ICD-10-CM | POA: Diagnosis not present

## 2024-03-13 ENCOUNTER — Telehealth: Payer: Self-pay | Admitting: Family Medicine

## 2024-03-13 NOTE — Telephone Encounter (Signed)
 Copied from CRM [id number]. Topic: General - Other >> Mar 13, 2024 12:34 PM Retta Caster wrote: Reason for CRM: Patient needs email address if office can provide this due to paperwork she is filling out for her dentist asks for his information. Needs call back. Ok to leave detail message. [phone]

## 2024-03-14 ENCOUNTER — Encounter: Payer: Self-pay | Admitting: Family Medicine

## 2024-03-16 DIAGNOSIS — M79675 Pain in left toe(s): Secondary | ICD-10-CM | POA: Diagnosis not present

## 2024-03-16 DIAGNOSIS — I739 Peripheral vascular disease, unspecified: Secondary | ICD-10-CM | POA: Diagnosis not present

## 2024-03-16 DIAGNOSIS — M79671 Pain in right foot: Secondary | ICD-10-CM | POA: Diagnosis not present

## 2024-03-16 DIAGNOSIS — L609 Nail disorder, unspecified: Secondary | ICD-10-CM | POA: Diagnosis not present

## 2024-03-16 DIAGNOSIS — M79672 Pain in left foot: Secondary | ICD-10-CM | POA: Diagnosis not present

## 2024-03-16 DIAGNOSIS — L110 Acquired keratosis follicularis: Secondary | ICD-10-CM | POA: Diagnosis not present

## 2024-03-16 DIAGNOSIS — L600 Ingrowing nail: Secondary | ICD-10-CM | POA: Diagnosis not present

## 2024-03-16 DIAGNOSIS — M79674 Pain in right toe(s): Secondary | ICD-10-CM | POA: Diagnosis not present

## 2024-03-18 ENCOUNTER — Other Ambulatory Visit: Payer: Self-pay | Admitting: Internal Medicine

## 2024-03-18 DIAGNOSIS — M154 Erosive (osteo)arthritis: Secondary | ICD-10-CM

## 2024-03-21 DIAGNOSIS — G4733 Obstructive sleep apnea (adult) (pediatric): Secondary | ICD-10-CM | POA: Diagnosis not present

## 2024-03-23 NOTE — Telephone Encounter (Signed)
 Pt has been seen in office since this telephone encounter, will close encounter.

## 2024-04-03 ENCOUNTER — Ambulatory Visit: Payer: 59 | Admitting: Internal Medicine

## 2024-04-05 DIAGNOSIS — M546 Pain in thoracic spine: Secondary | ICD-10-CM | POA: Diagnosis not present

## 2024-04-05 DIAGNOSIS — M9903 Segmental and somatic dysfunction of lumbar region: Secondary | ICD-10-CM | POA: Diagnosis not present

## 2024-04-05 DIAGNOSIS — M9902 Segmental and somatic dysfunction of thoracic region: Secondary | ICD-10-CM | POA: Diagnosis not present

## 2024-04-05 DIAGNOSIS — M9901 Segmental and somatic dysfunction of cervical region: Secondary | ICD-10-CM | POA: Diagnosis not present

## 2024-04-05 DIAGNOSIS — M542 Cervicalgia: Secondary | ICD-10-CM | POA: Diagnosis not present

## 2024-04-10 DIAGNOSIS — Z8679 Personal history of other diseases of the circulatory system: Secondary | ICD-10-CM | POA: Diagnosis not present

## 2024-04-10 DIAGNOSIS — R0789 Other chest pain: Secondary | ICD-10-CM | POA: Diagnosis not present

## 2024-04-12 ENCOUNTER — Other Ambulatory Visit (HOSPITAL_BASED_OUTPATIENT_CLINIC_OR_DEPARTMENT_OTHER): Payer: Self-pay | Admitting: Pulmonary Disease

## 2024-04-21 DIAGNOSIS — G4733 Obstructive sleep apnea (adult) (pediatric): Secondary | ICD-10-CM | POA: Diagnosis not present

## 2024-04-22 ENCOUNTER — Other Ambulatory Visit: Payer: Self-pay | Admitting: Internal Medicine

## 2024-04-22 DIAGNOSIS — M154 Erosive (osteo)arthritis: Secondary | ICD-10-CM

## 2024-04-24 NOTE — Telephone Encounter (Signed)
 Please schedule patient a follow up visit. Patient due 03/26/2024. Thanks!

## 2024-04-24 NOTE — Telephone Encounter (Signed)
 Attempted to contact patient and left message to advise patient to call the office and schedule appointment.

## 2024-04-24 NOTE — Telephone Encounter (Signed)
 Last Fill: 01/26/2024  Labs: 01/27/2024 CBC and BMP Creatinine 0.4  Next Visit: Due 03/26/2024. Message sent to the front to schedule.   Last Visit: 09/27/2023  DX: not mentioned  Current Dose per office note 09/27/2023: not mentioned  Okay to refill Celebrex ?

## 2024-05-04 DIAGNOSIS — M546 Pain in thoracic spine: Secondary | ICD-10-CM | POA: Diagnosis not present

## 2024-05-04 DIAGNOSIS — M9902 Segmental and somatic dysfunction of thoracic region: Secondary | ICD-10-CM | POA: Diagnosis not present

## 2024-05-04 DIAGNOSIS — M9903 Segmental and somatic dysfunction of lumbar region: Secondary | ICD-10-CM | POA: Diagnosis not present

## 2024-05-04 DIAGNOSIS — M9901 Segmental and somatic dysfunction of cervical region: Secondary | ICD-10-CM | POA: Diagnosis not present

## 2024-05-04 DIAGNOSIS — M542 Cervicalgia: Secondary | ICD-10-CM | POA: Diagnosis not present

## 2024-05-08 DIAGNOSIS — D3131 Benign neoplasm of right choroid: Secondary | ICD-10-CM | POA: Diagnosis not present

## 2024-05-08 DIAGNOSIS — H40012 Open angle with borderline findings, low risk, left eye: Secondary | ICD-10-CM | POA: Diagnosis not present

## 2024-05-08 DIAGNOSIS — H26491 Other secondary cataract, right eye: Secondary | ICD-10-CM | POA: Diagnosis not present

## 2024-05-10 ENCOUNTER — Encounter: Payer: Self-pay | Admitting: Family Medicine

## 2024-05-10 ENCOUNTER — Ambulatory Visit (INDEPENDENT_AMBULATORY_CARE_PROVIDER_SITE_OTHER): Payer: 59 | Admitting: Family Medicine

## 2024-05-10 VITALS — BP 113/66 | HR 77 | Temp 97.9°F | Ht 67.5 in | Wt 127.2 lb

## 2024-05-10 DIAGNOSIS — G4733 Obstructive sleep apnea (adult) (pediatric): Secondary | ICD-10-CM

## 2024-05-10 DIAGNOSIS — G629 Polyneuropathy, unspecified: Secondary | ICD-10-CM | POA: Diagnosis not present

## 2024-05-10 DIAGNOSIS — Z79899 Other long term (current) drug therapy: Secondary | ICD-10-CM

## 2024-05-10 DIAGNOSIS — M154 Erosive (osteo)arthritis: Secondary | ICD-10-CM | POA: Diagnosis not present

## 2024-05-10 DIAGNOSIS — J4520 Mild intermittent asthma, uncomplicated: Secondary | ICD-10-CM

## 2024-05-10 MED ORDER — PREGABALIN 75 MG PO CAPS: 75.0000 mg | ORAL_CAPSULE | Freq: Two times a day (BID) | ORAL | 5 refills | Status: DC

## 2024-05-10 NOTE — Patient Instructions (Signed)
 For sleep apnea mouth guards, the key medical billing codes are G47.33 for the diagnosis of Obstructive Sleep Apnea (OSA) and [phone] for the custom-fabricated oral appliance used to treat it.

## 2024-05-10 NOTE — Progress Notes (Signed)
 Subjective:  Patient ID: Kristin Wallace, female    DOB: 1956-04-06, 68 y.o.   MRN: [id number]  Patient Care Team: Severa Rock HERO, FNP as PCP - General (Family Medicine) Octavia Charleston, MD as Referring Physician (Ophthalmology)   Chief Complaint:  Medical Management of Chronic Issues (6 month follow up )   HPI: Kristin Wallace is a 68 y.o. female presenting on 05/10/2024 for Medical Management of Chronic Issues (6 month follow up )   The patient presents with concerns regarding sleep apnea management and neuropathy pain.  The patient is seeking assistance with obtaining a sleep apnea appliance. They have been using a CPAP machine nightly but experience issues such as dry mouth and discomfort due to the machine slipping when they sleep on their side. They have consulted with a specialized dentist in [place] but face a $325 copay as they are out of network. They are exploring options to find an in-network provider for the appliance.  They are currently taking Lyrica  75 mg twice daily for arthralgias and sensory neuropathy. While the medication helps, they still experience occasional severe neuropathy pain in their feet, though it is less frequent than before. They continue to receive their medication through a mail order pharmacy.  They have progressive arthritis, noting worsening symptoms such as finger contractures and nodules, which have developed since the pandemic. They mention that their fingers 'look horrible' and have lost their previous appearance.  They report no problems with asthma if they avoid dairy and have not experienced recent flares. They identify dairy as a trigger and avoid it, using substitutes like cashew-based products to manage their condition.       Relevant past medical, surgical, family, and social history reviewed and updated as indicated.  Allergies and medications reviewed and updated. Data reviewed: Chart in Epic.   Past Medical History:  Diagnosis Date    Allergy 1970?   shellfish   Anxiety ?   Arthritis    erosive osteoarthritis   Asthma    Atrial fibrillation (HCC)    Cataract    They were removed   Chronically dry eyes    Fatigue    chronic ( and weakness)   GERD (gastroesophageal reflux disease)    Lyme disease    Neuromuscular disorder (HCC)    neuropathy   Neuropathy    legs and feet   Osteoporosis    Sleep apnea 03/2023   Uses continuous positive airway pressure (CPAP) ventilation at home     Past Surgical History:  Procedure Laterality Date   ABDOMINAL HYSTERECTOMY     BREAST SURGERY Left    neg tumor   COLONOSCOPY     COLONOSCOPY WITH PROPOFOL  N/A 07/07/2022   Procedure: COLONOSCOPY WITH PROPOFOL ;  Surgeon: Cindie Carlin POUR, DO;  Location: [facility];  Service: Endoscopy;  Laterality: N/A;  1:15pm, ASA 2, per office pt can't move up   ESOPHAGOGASTRODUODENOSCOPY     EYE SURGERY  ?   torn retina, cataracts   POLYPECTOMY  07/07/2022   Procedure: POLYPECTOMY;  Surgeon: Cindie Carlin POUR, DO;  Location: [facility];  Service: Endoscopy;;   RIGHT AND LEFT HEART CATH     TUBAL LIGATION      Social History   Socioeconomic History   Marital status: Divorced    Spouse name: Not on file   Number of children: 3   Years of education: Not on file   Highest education level: 12th grade  Occupational History  Occupation: retired  Tobacco Use   Smoking status: Never    Passive exposure: Never   Smokeless tobacco: Never  Vaping Use   Vaping status: Never Used  Substance and Sexual Activity   Alcohol use: Yes    Comment: Occasionally drink small amounts   Drug use: Never   Sexual activity: Not Currently  Other Topics Concern   Not on file  Social History Narrative   Moved here from [place]  this year 07/27/2021.   Daughter lives with her   Right handed   Social Drivers of Health   Financial Resource Strain: Medium Risk (05/07/2024)   Overall Financial Resource Strain (CARDIA)    Difficulty of Paying  Living Expenses: Somewhat hard  Food Insecurity: No Food Insecurity (05/07/2024)   Hunger Vital Sign    Worried About Running Out of Food in the Last Year: Never true    Ran Out of Food in the Last Year: Never true  Transportation Needs: No Transportation Needs (05/07/2024)   PRAPARE - Administrator, Civil Service (Medical): No    Lack of Transportation (Non-Medical): No  Physical Activity: Sufficiently Active (05/07/2024)   Exercise Vital Sign    Days of Exercise per Week: 7 days    Minutes of Exercise per Session: 30 min  Stress: No Stress Concern Present (05/07/2024)   [employer] of Occupational Health - Occupational Stress Questionnaire    Feeling of Stress: Only a little  Social Connections: Moderately Integrated (05/07/2024)   Social Connection and Isolation Panel    Frequency of Communication with Friends and Family: More than three times a week    Frequency of Social Gatherings with Friends and Family: Three times a week    Attends Religious Services: More than 4 times per year    Active Member of Clubs or Organizations: Yes    Attends Banker Meetings: More than 4 times per year    Marital Status: Divorced  Intimate Partner Violence: Not At Risk (12/09/2023)   Humiliation, Afraid, Rape, and Kick questionnaire    Fear of Current or Ex-Partner: No    Emotionally Abused: No    Physically Abused: No    Sexually Abused: No    Outpatient Encounter Medications as of 05/10/2024  Medication Sig   albuterol  (VENTOLIN  HFA) 108 (90 Base) MCG/ACT inhaler Inhale into the lungs.   Ascorbic Acid (VITAMIN C) 1000 MG tablet Take 1,000 mg by mouth in the morning.   Calcium  Carb-Cholecalciferol (CALCIUM  600+D3 PO) Take 1 tablet by mouth every morning.   celecoxib  (CELEBREX ) 100 MG capsule TAKE 1 CAPSULE BY MOUTH ONCE  DAILY AS NEEDED   Cholecalciferol (VITAMIN D3) 125 MCG (5000 UT) CAPS Take 5,000 Units by mouth in the morning.   CINNAMON PO Take 1 Dose by mouth See  admin instructions. 1 teaspoonful twice daily with 2 teaspoonful of honey   cycloSPORINE (RESTASIS) 0.05 % ophthalmic emulsion Place 2 drops into both eyes 2 (two) times daily.   Multiple Vitamins-Minerals (MULTIVITAMIN WOMEN 50+) TABS Take 1 tablet by mouth in the morning.   pantoprazole  (PROTONIX ) 40 MG tablet TAKE 1 TABLET BY MOUTH DAILY   PSYLLIUM HUSK PO Take 1 Dose by mouth in the morning.   SODIUM FLUORIDE 5000 PPM 1.1 % PSTE Take by mouth.   TURMERIC CURCUMIN PO Take 1 capsule by mouth in the morning and at bedtime. W/Ginger Powder   zoledronic  acid (RECLAST ) 5 MG/100ML SOLN injection Inject into the vein.   [DISCONTINUED] pregabalin  (LYRICA )  75 MG capsule Take 1 capsule (75 mg total) by mouth 2 (two) times daily.   pregabalin  (LYRICA ) 75 MG capsule Take 1 capsule (75 mg total) by mouth 2 (two) times daily.   SYMBICORT  160-4.5 MCG/ACT inhaler USE 2 INHALATIONS BY MOUTH TWICE DAILY   traZODone  (DESYREL ) 50 MG tablet Take 1 tablet (50 mg total) by mouth at bedtime as needed for sleep.   No facility-administered encounter medications on file as of 05/10/2024.    Allergies  Allergen Reactions   Other Shortness Of Breath    Dairy   Shellfish Allergy Other (See Comments) and Nausea And Vomiting    Vomiting    Pertinent ROS per HPI, otherwise unremarkable      Objective:  BP 113/66   Pulse 77   Temp 97.9 F (36.6 C)   Ht 5' 7.5 (1.715 m)   Wt 127 lb 3.2 oz (57.7 kg)   SpO2 93%   BMI 19.63 kg/m    Wt Readings from Last 3 Encounters:  05/10/24 127 lb 3.2 oz (57.7 kg)  02/29/24 127 lb 3.2 oz (57.7 kg)  02/04/24 127 lb 9.6 oz (57.9 kg)    Physical Exam Vitals and nursing note reviewed.  Constitutional:      General: She is not in acute distress.    Appearance: Normal appearance. She is normal weight. She is not ill-appearing, toxic-appearing or diaphoretic.  HENT:     Head: Normocephalic and atraumatic.     Nose: Nose normal.     Mouth/Throat:     Mouth: Mucous  membranes are moist.  Eyes:     Conjunctiva/sclera: Conjunctivae normal.     Pupils: Pupils are equal, round, and reactive to light.  Cardiovascular:     Rate and Rhythm: Normal rate and regular rhythm.     Heart sounds: Normal heart sounds.  Pulmonary:     Effort: Pulmonary effort is normal.     Breath sounds: Normal breath sounds.  Musculoskeletal:     Cervical back: Neck supple.     Right lower leg: No edema.     Left lower leg: No edema.     Comments: Arthritis changes to bilateral hands with nodules on knuckles.   Skin:    General: Skin is warm and dry.     Capillary Refill: Capillary refill takes less than 2 seconds.  Neurological:     General: No focal deficit present.     Mental Status: She is alert and oriented to person, place, and time.  Psychiatric:        Mood and Affect: Mood normal.        Behavior: Behavior normal.        Thought Content: Thought content normal.        Judgment: Judgment normal.     Results for orders placed or performed in visit on 01/03/24  Pulmonary function test   Collection Time: 01/03/24  2:09 PM  Result Value Ref Range   FVC-Pre 2.53 L   FVC-%Pred-Pre 78 %   FVC-Post 2.61 L   FVC-%Pred-Post 80 %   FVC-%Change-Post 3 %   FEV1-Pre 1.96 L   FEV1-%Pred-Pre 79 %   FEV1-Post 2.07 L   FEV1-%Pred-Post 83 %   FEV1-%Change-Post 5 %   FEV6-Pre 2.52 L   FEV6-%Pred-Pre 81 %   FEV6-Post 2.61 L   FEV6-%Pred-Post 84 %   FEV6-%Change-Post 3 %   Pre FEV1/FVC ratio 77 %   FEV1FVC-%Pred-Pre 101 %   Post FEV1/FVC ratio 79 %  FEV1FVC-%Change-Post 2 %   Pre FEV6/FVC Ratio 100 %   FEV6FVC-%Pred-Pre 104 %   Post FEV6/FVC ratio 100 %   FEV6FVC-%Pred-Post 104 %   FEF 25-75 Pre 1.62 L/sec   FEF2575-%Pred-Pre 79 %   FEF 25-75 Post 1.99 L/sec   FEF2575-%Pred-Post 97 %   FEF2575-%Change-Post 23 %       Pertinent labs & imaging results that were available during my care of the patient were reviewed by me and considered in my medical decision  making.  Assessment & Plan:  Kristin Wallace was seen today for medical management of chronic issues.  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  Controlled substance agreement signed -     pregabalin  (LYRICA ) 75 MG capsule; Take 1 capsule (75 mg total) by mouth 2 (two) times daily.  Obstructive sleep apnea syndrome Discussed use of mouthpiece with pt, she will try to locate a provider in her network and make an appointment.   Erosive osteoarthritis of both hands Doing well on current regimen.   Mild intermittent asthma without complication Well controlled.      Obstructive Sleep Apnea Experiencing difficulties with current CPAP machine, including dry mouth and discomfort due to the machine slipping during sleep. Interested in obtaining a sleep apnea oral appliance but facing financial constraints due to out-of-network costs. - Provide ICD-10 codes G47.33 and [phone] for sleep apnea oral appliance. - Advise to contact insurance with provided codes to find an in-network provider. - Offer to assist further if the provided codes do not meet insurance requirements.  Progressive Arthritis Progressive arthritis with worsening symptoms including finger contractures and nodules.  Sensory Neuropathy Experiencing sensory neuropathy, primarily in feet, with occasional severe pain. Currently managing the condition with Lyrica  (pregabalin ) 75 mg twice daily, which has been effective in reducing the frequency and severity of pain. - Continue Lyrica  75 mg twice daily. - Send Lyrica  prescription to mail order pharmacy for refill. - Schedule follow-up in six months for medication review and physical examination.  Asthma Asthma is well-controlled with avoidance of dairy, a known trigger. No recent flares experienced. - Continue to avoid  dairy products to prevent asthma exacerbations.  General Health Maintenance Due for a routine physical examination and laboratory tests in six months. - Schedule physical examination and laboratory tests in January.          Continue all other maintenance medications.  Follow up plan: Return in about 6 months (around 11/10/2024), or if symptoms worsen or fail to improve, for Annual Physical.   Continue healthy lifestyle choices, including diet (rich in fruits, vegetables, and lean proteins, and low in salt and simple carbohydrates) and exercise (at least 30 minutes of moderate physical activity daily).   The above assessment and management plan was discussed with the patient. The patient verbalized understanding of and has agreed to the management plan. Patient is aware to call the clinic if they develop any new symptoms or if symptoms persist or worsen. Patient is aware when to return to the clinic for a follow-up visit. Patient educated on when it is appropriate to go to the emergency department.   Kristin Bruns, FNP-C [facility] [phone]

## 2024-05-12 NOTE — Progress Notes (Signed)
 Office Visit Note  Patient: Kristin Wallace             Date of Birth: 18-Dec-1955           MRN: [id number]             PCP: Severa Rock HERO, FNP Referring: Severa Rock HERO, FNP Visit Date: 05/26/2024   Subjective:  Follow-up (Doing good)    Discussed the use of AI scribe software for clinical note transcription with the patient, who gave verbal consent to proceed.  History of Present Illness   Kristin Wallace is a 68 y.o. female here for follow up for erosive osteoarthritis of the hand and osteoporosis on Celebrex  100 mg daily.    She takes Celebrex  once daily in the morning for arthritis, which affects her hands and feet, particularly after yard work. She experiences sharp pains in her toes, attributed to neuropathy, and wonders if it is related to her arthritis. She has a history of hammer toe and ingrown toenails, for which she sees a podiatrist.  She has diffuse sensory neuropathy, confirmed by a neurologist, affecting both her feet and hands. She takes pregabalin  75 mg twice daily, but an increased dose did not provide additional relief, so it was reduced back to the original dose. She has difficulty distinguishing between pain from neuropathy and other causes.  She has noticed that her non-dominant hand is starting to look more like her dominant hand, with each finger on each hand tending to mirror each other. She experiences pain in her thumb joint and decreased mobility in her fingers. She previously attended physical therapy, which improved her hand strength, but discontinued due to time constraints and not adhering to the exercise regimen.  She has osteoporosis, particularly in the spine, and receives Reclast  infusions, with her next infusion scheduled for March 17th. She has had two infusions so far. She reports a fall in the bathroom about three to four months ago, resulting in a concussion. She has a history of multiple foot fractures, attributed to unusual incidents, and  experiences occasional falls.  She finds relief from hand pain through monthly chiropractic visits, where her fingers are manipulated. She has a cyst on her index finger.      Interval labs and imaging reviewed: 3/27 labs CBC and Bmet normal no problems clebrex  01/25/24 DEXA Lumbar spine -2.8  Previous HPI 09/27/2023 Kristin Wallace is a 68 y.o. female here for follow up for erosive osteoarthritis of the hand and osteoporosis.  She presents with increased pain in their hands over the past month and a half. They report that the pain is intermittent and can be severe, particularly in the fingers. They also note a sensation of neuropathy in their hands, in addition to their known neuropathy in their legs and feet. The patient has been off raloxifene  for approximately three to four months, which they stopped after their last visit due to perceived lack of efficacy.   They describe a decrease in grip strength, leading to occasional dropping of objects.  She does have a previous history of carpal tunnel syndrome but not currently participating in any activities that provoke this in the past.  They also note changes in the appearance of their hands, with fingers appearing twisted and knobby, and express concern about the progression of these changes.   The patient also reports a history of Lyme disease, for which they were previously treated with prednisone . They experienced significant side effects from this medication, including hallucinations.  They also take Lyrica  for neuropathy.   The patient has been managing their symptoms with lifestyle modifications, such as adjusting how they hold objects to prevent dropping them. They also see a chiropractor monthly, which has improved their balance and reduced their need for a cane.    Previous HPI 03/30/2023 Kristin Wallace is a 68 y.o. 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  She stopped taking the  hydroxychloroquine  there is concern of associated QT prolongation.  After stopping that she did not notice any significant difference in joint pain or stiffness.  No significant change in bony nodules or hand function.  She had her Reclast  infusion in March experienced low blood pressure during the treatment and had a low-grade fever in the days immediately following.  These resolved without any specific intervention.   Previous HPI 09/29/22 Kristin Wallace is a 68 y.o. female here for follow up for erosive osteoarthritis and osteoporosis on HCQ 200 mg daily and raloxifene . Overall symptoms have been doing pretty well. New swelling at her right wrist started about 2 or 3 weeks ago that is mildly painful. Dropping items unintentionally from both hands some and has been modifying her grip for this. She has scheduled for multiple teeth fillings coming up in the next month with possible extractions if unsuccessful. Plans for f/u with additional teeth sometime after that.    Previous HPI 03/25/22 Kristin Wallace is a 68 y.o. female here for follow up for erosive OA and osteoporosis.  She is overall doing pretty well.  She still has impaired mobility and dexterity with the finger deformities but pain is somewhat decreased.  No major flareup with increase in localized joint swelling.   Previous HPI 12/23/2021 Kristin Wallace is a 68 y.o. female here for erosive OA and osteoporosis. 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 She was prescribed hydroxychloroquine  and treated with PIP joint steroid injection. Imaging of this previously looked consistent with erosive OA of the hands. She continues to have pain and stiffness worst in the right hand lasting much of the day. She takes lyrica  more for leg neuropathy than joint pains which is somewhat helpful. For osteoporosis she started raloxifene  due to need for ongoing  dental procedures so avoided bisphosphonate treatment. She has some multiple toe fractures over the years and has mild neuropathy but no falls or fragility fractures.   Review of Systems  Constitutional:  Positive for fatigue.  HENT:  Positive for mouth dryness. Negative for mouth sores.   Eyes:  Positive for dryness.  Respiratory:  Positive for shortness of breath.   Cardiovascular:  Positive for palpitations. Negative for chest pain.  Gastrointestinal:  Positive for diarrhea. Negative for blood in stool and constipation.  Endocrine: Negative for increased urination.  Genitourinary:  Positive for involuntary urination.  Musculoskeletal:  Positive for joint pain, gait problem, joint pain, joint swelling, myalgias, muscle weakness, morning stiffness, muscle tenderness and myalgias.  Skin:  Negative for color change, rash, hair loss and sensitivity to sunlight.  Allergic/Immunologic: Negative for susceptible to infections.  Neurological:  Negative for dizziness and headaches.  Hematological:  Negative for swollen glands.  Psychiatric/Behavioral:  Positive for sleep disturbance. Negative for depressed mood. The patient is not nervous/anxious.     PMFS History:  Patient Active Problem List   Diagnosis Date Noted   Controlled substance agreement signed 11/11/2023   Sleep apnea  02/22/2023   GERD (gastroesophageal reflux disease) 11/03/2022   Sensory neuropathy, mild, in the feet, 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 01/18/2022   Erosive osteoarthritis of both hands 12/23/2021   Hoarseness of voice 12/23/2021   Vitamin D  deficiency 12/23/2021   Age-related osteoporosis without current pathological fracture 12/23/2021   Mild intermittent asthma without complication 10/13/2021   Paroxysmal atrial fibrillation (HCC) 05/06/2016    Past Medical History:  Diagnosis Date   Allergy 1970?   shellfish   Anxiety ?   Arthritis    erosive osteoarthritis   Asthma    Atrial fibrillation (HCC)    Cataract    They  were removed   Chronically dry eyes    Fatigue    chronic ( and weakness)   GERD (gastroesophageal reflux disease)    Lyme disease    Neuromuscular disorder (HCC)    neuropathy   Neuropathy    legs and feet   Osteoporosis    Sleep apnea 03/2023   Uses continuous positive airway pressure (CPAP) ventilation at home     Family History  Problem Relation Age of Onset   Depression Mother    Other Mother        suicide   Other Father        plane crash   Arthritis Father    Heart disease Sister    Early death Sister    Cancer Brother    Cancer Brother        skin cancer   Depression Daughter    Thyroid  disease Daughter    Migraines Daughter    Colon cancer Neg Hx    Past Surgical History:  Procedure Laterality Date   ABDOMINAL HYSTERECTOMY     BREAST SURGERY Left    neg tumor   COLONOSCOPY     COLONOSCOPY WITH PROPOFOL  N/A 07/07/2022   Procedure: COLONOSCOPY WITH PROPOFOL ;  Surgeon: Cindie Carlin POUR, DO;  Location: [facility];  Service: Endoscopy;  Laterality: N/A;  1:15pm, ASA 2, per office pt can't move up   ESOPHAGOGASTRODUODENOSCOPY     EYE SURGERY  ?   torn retina, cataracts   POLYPECTOMY  07/07/2022   Procedure: POLYPECTOMY;  Surgeon: Cindie Carlin POUR, DO;  Location: [facility];  Service: Endoscopy;;   RIGHT AND LEFT HEART CATH     TUBAL LIGATION     Social History   Social History Narrative   Moved here from [place]  this year 07/27/2021.   Daughter lives with her   Right handed   Immunization History  Administered Date(s) Administered   Influenza Split 09/02/2013   Moderna SARS-COV2 Booster Vaccination 09/06/2020, 05/17/2021   Moderna Sars-Covid-2 Vaccination 11/13/2019, 12/11/2019   PNEUMOCOCCAL CONJUGATE-20 05/13/2023   Pneumococcal Polysaccharide-23 01/02/2021   Td 01/17/2010   Tdap 04/06/2018, 06/16/2022   Zoster Recombinant(Shingrix) 09/18/2020, 01/03/2021     Objective: Vital Signs: BP 108/64 (BP Location: Left Arm, Patient Position:  Sitting, Cuff Size: Normal)   Pulse 65   Resp 16   Ht 5' 6.75 (1.695 m)   Wt 129 lb (58.5 kg)   BMI 20.36 kg/m    Physical Exam Eyes:     Conjunctiva/sclera: Conjunctivae normal.  Cardiovascular:     Rate and Rhythm: Normal rate and regular rhythm.  Pulmonary:     Effort: Pulmonary effort is normal.     Breath sounds: Normal breath sounds.  Musculoskeletal:     Right lower leg: No edema.     Left lower leg: No edema.  Lymphadenopathy:  Cervical: No cervical adenopathy.  Skin:    General: Skin is warm and dry.     Findings: No rash.  Neurological:     Mental Status: She is alert.  Psychiatric:        Mood and Affect: Mood normal.      Musculoskeletal Exam: Elbows full ROM no tenderness or swelling Wrists full ROM no tenderness or swelling Extensive Heberden's nodes throughout fingers on both hands, medial deviation of second DIPs and lateral deviation of third PIP and DIPs, advanced squaring of left 1st CMC joint with nonreducible MCP subluxation and hyperextension  Right 2nd DIP mucinous cyst present without nail change Knees full ROM no tenderness or swelling Ankles full ROM no tenderness or swelling Chronic toe changes with some cocked up MTP joint changes Right 1st toenail dystrophic, left 4th toenail absent, no pitting changes   Investigation: No additional findings.  Imaging: No results found.  Recent Labs: Lab Results  Component Value Date   WBC 4.1 11/11/2023   HGB 11.9 11/11/2023   PLT 310 11/11/2023   NA 140 11/11/2023   K 3.9 11/11/2023   CL 101 11/11/2023   CO2 24 11/11/2023   GLUCOSE 99 11/11/2023   BUN 9 11/11/2023   CREATININE 0.61 11/11/2023   BILITOT 0.3 11/11/2023   ALKPHOS 53 11/11/2023   AST 17 11/11/2023   ALT 13 11/11/2023   PROT 6.7 11/11/2023   ALBUMIN 4.5 11/11/2023   CALCIUM  9.5 11/11/2023    Speciality Comments: PLQ Eye Exam 03/11/2023 Normal [facility] f/u 12 months.  Procedures:  No procedures  performed Allergies: Other and Shellfish allergy   Assessment / Plan:     Visit Diagnoses: Erosive Osteoarthritis Erosive osteoarthritis affecting hands, particularly the right, with decreased motion and pain. Progression noted in left hand. Intermittent pain at thumb base, decreased strength. Physical therapy beneficial but inconsistent. - Continue Celebrex  100 mg daily - Encouraged daily hand exercises to maintain strength and mobility. - Discussed benefits of physical therapy and encouraged resumption.  Diffuse Sensory Neuropathy Diffuse sensory neuropathy in feet and hands with sharp toe pain. Pregabalin  75 mg twice daily maintained due to lack of benefit at higher dose. Neuropathy unrelated to diabetes. Difficulty distinguishing neuropathy from arthritis pain.  Osteoporosis of the spine Osteoporosis confirmed by bone density scan. Receiving Reclast  infusions. History of falls, including one with concussion, indicating fracture risk. - Continue Reclast  infusions as scheduled.  Foot Fractures Multiple foot fractures with unusual injury mechanisms.        Orders: No orders of the defined types were placed in this encounter.  Meds ordered this encounter  Medications   DISCONTD: celecoxib  (CELEBREX ) 100 MG capsule    Sig: Take 1 capsule (100 mg total) by mouth daily.    Dispense:  90 capsule    Refill:  3     Follow-Up Instructions: Return in about 1 year (around 05/26/2025) for EOH/OP on [facility]/reclast  f/u 80yr.   Lonni LELON Ester, MD  Note - This record has been created using [employer].  Chart creation errors have been sought, but may not always  have been located. Such creation errors do not reflect on  the standard of medical care.

## 2024-05-26 ENCOUNTER — Encounter: Payer: Self-pay | Admitting: Internal Medicine

## 2024-05-26 ENCOUNTER — Ambulatory Visit: Attending: Internal Medicine | Admitting: Internal Medicine

## 2024-05-26 ENCOUNTER — Other Ambulatory Visit: Payer: Self-pay | Admitting: Internal Medicine

## 2024-05-26 VITALS — BP 108/64 | HR 65 | Resp 16 | Ht 66.75 in | Wt 129.0 lb

## 2024-05-26 DIAGNOSIS — M154 Erosive (osteo)arthritis: Secondary | ICD-10-CM

## 2024-05-26 DIAGNOSIS — E559 Vitamin D deficiency, unspecified: Secondary | ICD-10-CM

## 2024-05-26 DIAGNOSIS — M810 Age-related osteoporosis without current pathological fracture: Secondary | ICD-10-CM

## 2024-05-26 MED ORDER — CELECOXIB 100 MG PO CAPS: 100.0000 mg | ORAL_CAPSULE | Freq: Every day | ORAL | 3 refills | Status: DC

## 2024-05-26 NOTE — Telephone Encounter (Signed)
 Medication was sent to wrong pharmacy, corrected pharmacy and called [facility] to cancel original prescription.

## 2024-05-26 NOTE — Telephone Encounter (Signed)
 Patient called stating she noticed on her AVS that her Celebrex  medication was sent to the wrong pharmacy.  Patient states the medication needs to be sent to [facility].  Patient states she still has 2-3 weeks remaining on her current prescription.

## 2024-05-29 MED ORDER — CELECOXIB 100 MG PO CAPS: 100.0000 mg | ORAL_CAPSULE | Freq: Every day | ORAL | 3 refills | Status: AC

## 2024-06-02 DIAGNOSIS — G4733 Obstructive sleep apnea (adult) (pediatric): Secondary | ICD-10-CM | POA: Diagnosis not present

## 2024-06-05 DIAGNOSIS — M25561 Pain in right knee: Secondary | ICD-10-CM | POA: Diagnosis not present

## 2024-06-05 DIAGNOSIS — M546 Pain in thoracic spine: Secondary | ICD-10-CM | POA: Diagnosis not present

## 2024-06-05 DIAGNOSIS — M542 Cervicalgia: Secondary | ICD-10-CM | POA: Diagnosis not present

## 2024-06-05 DIAGNOSIS — M9902 Segmental and somatic dysfunction of thoracic region: Secondary | ICD-10-CM | POA: Diagnosis not present

## 2024-06-05 DIAGNOSIS — M9903 Segmental and somatic dysfunction of lumbar region: Secondary | ICD-10-CM | POA: Diagnosis not present

## 2024-06-05 DIAGNOSIS — M9901 Segmental and somatic dysfunction of cervical region: Secondary | ICD-10-CM | POA: Diagnosis not present

## 2024-06-11 DIAGNOSIS — R062 Wheezing: Secondary | ICD-10-CM | POA: Diagnosis not present

## 2024-06-11 DIAGNOSIS — J209 Acute bronchitis, unspecified: Secondary | ICD-10-CM | POA: Diagnosis not present

## 2024-06-11 DIAGNOSIS — R079 Chest pain, unspecified: Secondary | ICD-10-CM | POA: Diagnosis not present

## 2024-06-11 DIAGNOSIS — R918 Other nonspecific abnormal finding of lung field: Secondary | ICD-10-CM | POA: Diagnosis not present

## 2024-06-11 DIAGNOSIS — R059 Cough, unspecified: Secondary | ICD-10-CM | POA: Diagnosis not present

## 2024-06-11 DIAGNOSIS — R1084 Generalized abdominal pain: Secondary | ICD-10-CM | POA: Diagnosis not present

## 2024-06-11 NOTE — ED Provider Notes (Addendum)
 Emergency Department Provider Note    ED Clinical Impression   Final diagnoses:  Generalized abdominal pain (Primary)  Acute bronchitis, unspecified organism    ED Assessment/Plan    History   Chief Complaint  Patient presents with  . Diarrhea    diarrhea   HPI  2045 hrs. Patient is a 68 year old female complaints of several different patient cough congestion of several days duration since she went to [employer] also some abdominal cramping and diarrhea but exam awake alert cooperative.  Cardiorespiratory general neuroexam negative with some cough related wheeze rhonchi abdomen benign no other findings. IV fluids symptom relief will consider for outpatient treatment referral possible COVID-19  Past Medical History[1]  Past Surgical History[2]  Family History[3]  Social History[4]  Review of Systems  Respiratory:  Positive for cough. Negative for chest tightness and shortness of breath.   Gastrointestinal:  Positive for abdominal pain. Negative for abdominal distention, diarrhea and nausea.  All other systems reviewed and are negative.   Physical Exam   BP 146/81   Pulse 81   Temp 36.8 C (98.2 F) (Temporal)   Resp 18   Ht 167.6 cm (5' 6)   Wt 58.1 kg (128 lb)   SpO2 98%   BMI 20.66 kg/m   Physical Exam  Vital signs have been reviewed. Patient is well-appearing w/o respiratory distress shock or major trauma. HEENT is atraumatic.  Neck shows unimpaired range of motion. Chest No increased work of breathing or audible wheezing. Cardiac Good perfusion throughout. Abdomen is not distended.  Abdomen nontender Extremities are without significant trauma. Skin no visualized rash. Neuro exam is grossly non-focal. Psych exam shows normal mood and behavior.  ED Course    Fluids symptom relief possible COVID-19 acute bronchitis   Medical Decision Making   2230 hrs. Symptoms are improved at this time plan discharge home with close follow-up patient to  check on results of COVID-19 swab      Dallara, Norleen Agent, MD 06/11/24 [id number]    Dallara, John James, MD 06/11/24 [id number]       [1] Past Medical History: Diagnosis Date  . Arthritis   . Asthma   . Atrial fibrillation      . Lyme disease   . Neuropathy   . Osteoporosis of multiple sites   [2] Past Surgical History: Procedure Laterality Date  . BREAST EXCISIONAL BIOPSY Left    1990's-benign  . CARDIAC CATHETERIZATION    . HYSTERECTOMY     partial age 27  . TUBAL LIGATION    [3] Family History Problem Relation Age of Onset  . Heart failure Sister   . Heart failure Brother   . Heart failure Brother   [4] Social History Socioeconomic History  . Marital status: Divorced  Tobacco Use  . Smoking status: Never  . Smokeless tobacco: Never  Vaping Use  . Vaping status: Never Used  Substance and Sexual Activity  . Alcohol use: Not Currently    Comment: occasionally  . Drug use: Never   Social Drivers of Corporate investment banker Strain: Medium Risk (05/07/2024)   Received from [facility]   Overall Financial Resource Strain (CARDIA)   . How hard is it for you to pay for the very basics like food, housing, medical care, and heating?: Somewhat hard  Food Insecurity: No Food Insecurity (05/07/2024)   Received from [facility]   Hunger Vital Sign   . Within the past 12 months, you worried that your food would run out  before you got the money to buy more.: Never true   . Within the past 12 months, the food you bought just didn't last and you didn't have money to get more.: Never true  Transportation Needs: No Transportation Needs (05/07/2024)   Received from [facility] - Transportation   . In the past 12 months, has lack of transportation kept you from medical appointments or from getting medications?: No   . In the past 12 months, has lack of transportation kept you from meetings, work, or from getting things needed for daily living?: No  Physical Activity:  Sufficiently Active (05/07/2024)   Received from [facility]   Exercise Vital Sign   . On average, how many days per week do you engage in moderate to strenuous exercise (like a brisk walk)?: 7 days   . On average, how many minutes do you engage in exercise at this level?: 30 min  Stress: No Stress Concern Present (05/07/2024)   Received from [facility] - Occupational Stress Questionnaire   . Do you feel stress - tense, restless, nervous, or anxious, or unable to sleep at night because your mind is troubled all the time - these days?: Only a little  Social Connections: Moderately Integrated (05/07/2024)   Received from [facility]   Social Connection and Isolation Panel   . In a typical week, how many times do you talk on the phone with family, friends, or neighbors?: More than three times a week   . How often do you get together with friends or relatives?: Three times a week   . How often do you attend church or religious services?: More than 4 times per year   . Do you belong to any clubs or organizations such as church groups, unions, fraternal or athletic groups, or school groups?: Yes   . How often do you attend meetings of the clubs or organizations you belong to?: More than 4 times per year   . Are you married, widowed, divorced, separated, never married, or living with a partner?: Divorced  Housing: Unknown (10/03/2020)   Received from [employer] Stability Vital Sign   . Unstable Housing in the Last Year: No   Dallara, Norleen Agent, MD 06/11/24 [id number]

## 2024-06-12 ENCOUNTER — Ambulatory Visit: Payer: Self-pay

## 2024-06-12 NOTE — Telephone Encounter (Signed)
 Appt made.

## 2024-06-12 NOTE — Telephone Encounter (Signed)
 FYI Only or Action Required?: FYI only for provider.  Patient was last seen in primary care on 05/10/2024 by Severa Rock HERO, FNP.  Called Nurse Triage reporting Diarrhea.  Symptoms began several days ago.  Interventions attempted: Other: ed.  Symptoms are: gradually improving.  Triage Disposition: See Physician Within 24 Hours  Patient/caregiver understands and will follow disposition?: Yes           Copied from CRM #[id number]. Topic: Clinical - Red Word Triage >> Jun 12, 2024  1:51 PM Alfonso ORN wrote: Red Word that prompted transfer to Nurse Triage:  patient went to ER last night , have a stomach bug , feel wiped out , ,diarrhea , bowels  not totally liquid but is lose , a lot of stomach cramping ,  patient call back number [phone] Reason for Disposition  [1] MODERATE diarrhea (e.g., 4-6 times / day more than normal) AND [2] present > 48 hours (2 days)  Protocols used: [facility]

## 2024-06-13 ENCOUNTER — Encounter: Payer: Self-pay | Admitting: Family Medicine

## 2024-06-13 ENCOUNTER — Ambulatory Visit: Admitting: Family Medicine

## 2024-06-13 VITALS — BP 110/68 | HR 85 | Temp 97.8°F | Ht 66.0 in | Wt 126.4 lb

## 2024-06-13 DIAGNOSIS — J4520 Mild intermittent asthma, uncomplicated: Secondary | ICD-10-CM | POA: Diagnosis not present

## 2024-06-13 DIAGNOSIS — R197 Diarrhea, unspecified: Secondary | ICD-10-CM | POA: Diagnosis not present

## 2024-06-13 MED ORDER — DIPHENOXYLATE-ATROPINE 2.5-0.025 MG PO TABS: 2.0000 | ORAL_TABLET | Freq: Four times a day (QID) | ORAL | 0 refills | Status: DC | PRN

## 2024-06-13 NOTE — Progress Notes (Signed)
 Subjective:  Patient ID: Kristin Wallace, female    DOB: [birth year]  Age: 68 y.o. MRN: [id number]  CC: ER FOLLOW UP, Diarrhea, and ACUTE BRONCHITIS   HPI  Discussed the use of AI scribe software for clinical note transcription with the patient, who gave verbal consent to proceed.  History of Present Illness   Discussed the use of AI scribe software for clinical note transcription with the patient, who gave verbal consent to proceed.  History of Present Illness   Kristin Wallace is a 68 year old female with asthma who presents with diarrhea and follow-up after an ER visit.  She experienced diarrhea after consuming a fish sandwich at [facility] on Thursday. Symptoms began with stomach cramps, headache, and chills on the way home, followed by diarrhea and a fever of 100.26F, which is higher than her usual temperature range of 97.22F to 97.42F. The diarrhea worsened the next day, with severe cramps and chills, leading to approximately six episodes of bowel movements. Symptoms persisted through Saturday, exacerbated by eating, prompting an ER visit on Sunday.  At the ER, she was told by the doctor that she had acute bronchitis and some kind of a stomach issue. Tests for COVID, flu, and RSV were negative. She was prescribed albuterol , prednisone , promethazine for nausea, and famotidine. Since the ER visit, diarrhea has improved, with two to three bowel movements today and five yesterday, though they remain loose. She feels better overall, with no fever and reduced frequency of bowel movements.  She has a history of asthma, which exacerbated during her illness. She completed a course of prednisone  and uses albuterol  as needed. She did not have significant respiratory symptoms before the ER visit and is surprised by the bronchitis diagnosis, as she is not coughing up phlegm.  She has a longstanding issue with morning diarrhea, managed with psyllium husk as recommended by her  gastroenterologist. She has undergone multiple endoscopies and colonoscopies in the past to investigate this issue.  No urinary symptoms such as burning or increased frequency. Her bowel movements are described as 'like a milkshake that's been watered down.' She feels wiped out but notes improvement in her condition.            06/13/2024    3:13 PM 05/10/2024    2:46 PM 02/04/2024   11:50 AM  Depression screen PHQ 2/9  Decreased Interest 0 0 0  Down, Depressed, Hopeless 0 0 0  PHQ - 2 Score 0 0 0  Altered sleeping  1 2  Tired, decreased energy  2 3  Change in appetite  1 0  Feeling bad or failure about yourself   0 0  Trouble concentrating  0 3  Moving slowly or fidgety/restless  0 1  Suicidal thoughts  0 0  PHQ-9 Score  4 9  Difficult doing work/chores  Somewhat difficult Somewhat difficult    History Sharona has a past medical history of Allergy (1970?), Anxiety (?), Arthritis, Asthma, Atrial fibrillation (HCC), Cataract, Chronically dry eyes, Fatigue, GERD (gastroesophageal reflux disease), Lyme disease, Neuromuscular disorder (HCC), Neuropathy, Osteoporosis, Sleep apnea (03/2023), and Uses continuous positive airway pressure (CPAP) ventilation at home.   She has a past surgical history that includes Tubal ligation; Abdominal hysterectomy; RIGHT AND LEFT HEART CATH; Breast surgery (Left); Colonoscopy; Esophagogastroduodenoscopy; Colonoscopy with propofol  (N/A, 07/07/2022); polypectomy (07/07/2022); and Eye surgery (?).   Her family history includes Arthritis in her father; Cancer in her brother and brother; Depression in her daughter and mother; Early death  in her sister; Heart disease in her sister; Migraines in her daughter; Other in her father and mother; Thyroid  disease in her daughter.She reports that she has never smoked. She has never been exposed to tobacco smoke. She has never used smokeless tobacco. She reports current alcohol use. She reports that she does not use  drugs.    ROS Review of Systems  Constitutional: Negative.   HENT:  Negative for congestion.   Eyes:  Negative for visual disturbance.  Respiratory:  Negative for shortness of breath.   Cardiovascular:  Negative for chest pain.  Gastrointestinal:  Negative for abdominal pain, constipation, diarrhea, nausea and vomiting.  Genitourinary:  Negative for difficulty urinating.  Musculoskeletal:  Negative for arthralgias and myalgias.  Neurological:  Negative for headaches.  Psychiatric/Behavioral:  Negative for sleep disturbance.     Objective:  BP 110/68   Pulse 85   Temp 97.8 F (36.6 C)   Ht 5' 6 (1.676 m)   Wt 126 lb 6.4 oz (57.3 kg)   SpO2 96%   BMI 20.40 kg/m   BP Readings from Last 3 Encounters:  06/13/24 110/68  05/26/24 108/64  05/10/24 113/66    Wt Readings from Last 3 Encounters:  06/13/24 126 lb 6.4 oz (57.3 kg)  05/26/24 129 lb (58.5 kg)  05/10/24 127 lb 3.2 oz (57.7 kg)     Physical Exam Constitutional:      General: She is not in acute distress.    Appearance: She is well-developed.  Cardiovascular:     Rate and Rhythm: Normal rate and regular rhythm.  Pulmonary:     Breath sounds: Normal breath sounds.  Musculoskeletal:        General: Normal range of motion.  Skin:    General: Skin is warm and dry.  Neurological:     Mental Status: She is alert and oriented to person, place, and time.      Assessment & Plan:  Diarrhea of presumed infectious origin  Mild intermittent asthma, unspecified whether complicated  Other orders -     Diphenoxylate -Atropine ; Take 2 tablets by mouth 4 (four) times daily as needed for diarrhea or loose stools.  Dispense: 30 tablet; Refill: 0    Assessment and Plan Assessment & Plan  Assessment and Plan    Acute infectious diarrhea Recent episode of diarrhea post-consumption of fish sandwich. Symptoms improved post-ER visit. No pathogen identified. Possible foodborne illness or viral gastroenteritis. -  Prescribed antidiarrheal medication. - Continue promethazine as needed for nausea. - Continue famotidine for gastric acid reduction. - Advised bland diet and maintain hydration.  Asthma Recent exacerbation likely triggered by acute illness. No current wheezing or respiratory distress. - Discontinue prednisone . - Continue albuterol  inhaler as needed.      1. Diarrhea of presumed infectious origin   2. Mild intermittent asthma, unspecified whether complicated     Meds ordered this encounter  Medications   diphenoxylate -atropine  (LOMOTIL ) 2.5-0.025 MG tablet    Sig: Take 2 tablets by mouth 4 (four) times daily as needed for diarrhea or loose stools.    Dispense:  30 tablet    Refill:  0      Follow up as needed.  Butler Der, MD       Follow-up: Return if symptoms worsen or fail to improve.  Butler Der, M.D.

## 2024-06-19 DIAGNOSIS — G4733 Obstructive sleep apnea (adult) (pediatric): Secondary | ICD-10-CM | POA: Diagnosis not present

## 2024-06-22 DIAGNOSIS — M79671 Pain in right foot: Secondary | ICD-10-CM | POA: Diagnosis not present

## 2024-06-22 DIAGNOSIS — L110 Acquired keratosis follicularis: Secondary | ICD-10-CM | POA: Diagnosis not present

## 2024-06-22 DIAGNOSIS — L609 Nail disorder, unspecified: Secondary | ICD-10-CM | POA: Diagnosis not present

## 2024-06-22 DIAGNOSIS — I739 Peripheral vascular disease, unspecified: Secondary | ICD-10-CM | POA: Diagnosis not present

## 2024-06-22 DIAGNOSIS — L600 Ingrowing nail: Secondary | ICD-10-CM | POA: Diagnosis not present

## 2024-06-22 DIAGNOSIS — M79672 Pain in left foot: Secondary | ICD-10-CM | POA: Diagnosis not present

## 2024-06-22 DIAGNOSIS — M79675 Pain in left toe(s): Secondary | ICD-10-CM | POA: Diagnosis not present

## 2024-06-22 DIAGNOSIS — M79674 Pain in right toe(s): Secondary | ICD-10-CM | POA: Diagnosis not present

## 2024-07-04 ENCOUNTER — Other Ambulatory Visit: Payer: Self-pay | Admitting: *Deleted

## 2024-07-04 MED ORDER — ALBUTEROL SULFATE HFA 108 (90 BASE) MCG/ACT IN AERS
2.0000 | INHALATION_SPRAY | Freq: Four times a day (QID) | RESPIRATORY_TRACT | 11 refills | Status: AC | PRN
Start: 2024-07-04 — End: ?

## 2024-07-06 DIAGNOSIS — G4733 Obstructive sleep apnea (adult) (pediatric): Secondary | ICD-10-CM | POA: Diagnosis not present

## 2024-07-19 DIAGNOSIS — M25561 Pain in right knee: Secondary | ICD-10-CM | POA: Diagnosis not present

## 2024-07-19 DIAGNOSIS — M9903 Segmental and somatic dysfunction of lumbar region: Secondary | ICD-10-CM | POA: Diagnosis not present

## 2024-07-19 DIAGNOSIS — M542 Cervicalgia: Secondary | ICD-10-CM | POA: Diagnosis not present

## 2024-07-19 DIAGNOSIS — M9902 Segmental and somatic dysfunction of thoracic region: Secondary | ICD-10-CM | POA: Diagnosis not present

## 2024-07-19 DIAGNOSIS — M546 Pain in thoracic spine: Secondary | ICD-10-CM | POA: Diagnosis not present

## 2024-07-19 DIAGNOSIS — M9901 Segmental and somatic dysfunction of cervical region: Secondary | ICD-10-CM | POA: Diagnosis not present

## 2024-07-20 DIAGNOSIS — G4733 Obstructive sleep apnea (adult) (pediatric): Secondary | ICD-10-CM | POA: Diagnosis not present

## 2024-08-11 DIAGNOSIS — S92354A Nondisplaced fracture of fifth metatarsal bone, right foot, initial encounter for closed fracture: Secondary | ICD-10-CM | POA: Diagnosis not present

## 2024-08-11 DIAGNOSIS — S82831A Other fracture of upper and lower end of right fibula, initial encounter for closed fracture: Secondary | ICD-10-CM | POA: Diagnosis not present

## 2024-08-11 DIAGNOSIS — Z91011 Allergy to milk products, unspecified: Secondary | ICD-10-CM | POA: Diagnosis not present

## 2024-08-11 DIAGNOSIS — S92811A Other fracture of right foot, initial encounter for closed fracture: Secondary | ICD-10-CM | POA: Diagnosis not present

## 2024-08-11 DIAGNOSIS — W109XXA Fall (on) (from) unspecified stairs and steps, initial encounter: Secondary | ICD-10-CM | POA: Diagnosis not present

## 2024-08-11 DIAGNOSIS — Z91013 Allergy to seafood: Secondary | ICD-10-CM | POA: Diagnosis not present

## 2024-08-15 ENCOUNTER — Ambulatory Visit (INDEPENDENT_AMBULATORY_CARE_PROVIDER_SITE_OTHER): Admitting: Orthopedic Surgery

## 2024-08-15 ENCOUNTER — Other Ambulatory Visit

## 2024-08-15 DIAGNOSIS — M25571 Pain in right ankle and joints of right foot: Secondary | ICD-10-CM

## 2024-08-16 NOTE — Progress Notes (Signed)
 Office Visit Note   Patient: Kristin Wallace           Date of Birth: 15-Oct-1956           MRN: [id number] Visit Date: 08/15/2024              Requested by: Severa Rock HERO, FNP [address] [place],  [place] [address] PCP: Severa Rock HERO, FNP  Chief Complaint  Patient presents with   Right Foot - Pain    [facility] 08/11/2024      HPI: Discussed the use of AI scribe software for clinical note transcription with the patient, who gave verbal consent to proceed.  History of Present Illness Kristin Wallace is a 68 year old female who presents with nondisplaced fractures of the distal fibula and proximal phalanx of the right little toe.  She is currently using a fracture boot. She is concerned about her mobility and safety while managing daily activities, particularly when entering her chicken coop for feeding and cleaning tasks.  Her social support system includes friends who are currently taking care of her pets, including six chickens, while she is staying at a friend's house during her recovery.  She receives Reclast  infusion once a year and takes vitamin D3 supplements. She is unaware of her bone mineral density values.     Assessment & Plan: Visit Diagnoses:  1. Pain in right ankle and joints of right foot     Plan: Assessment and Plan Assessment & Plan Nondisplaced fracture of distal right fibula Stable fracture, no surgical intervention needed, favorable prognosis. - Advise weight bearing as tolerated in fracture boot. - Plan follow-up in four weeks with three-view radiographs of the right ankle.  Nondisplaced fracture of proximal phalanx of right little toe Stable fracture, no surgical intervention needed. - Advise weight bearing as tolerated in fracture boot. - Plan follow-up in four weeks with two-view radiographs of the right foot.      Follow-Up Instructions: No follow-ups on file.   Ortho Exam  Patient is alert, oriented, no adenopathy, well-dressed,  normal affect, normal respiratory effort. Physical Exam CARDIOVASCULAR: Palpable dorsalis pedis pulse. MUSCULOSKELETAL: Ecchymosis and bruising on heel pad. Tender to palpation over fibula at nondisplaced fracture site. Swelling of right little toe at nondisplaced proximal phalanx fracture.      Imaging: No results found. No images are attached to the encounter.  Labs: Lab Results  Component Value Date   ESRSEDRATE 6 09/27/2023   ESRSEDRATE 2 03/30/2023   ESRSEDRATE 2 12/23/2021   CRP <3.0 09/27/2023   CRP 0.7 09/29/2022   CRP 0.5 12/23/2021     Lab Results  Component Value Date   ALBUMIN 4.5 11/11/2023   ALBUMIN 4.3 05/13/2023   ALBUMIN 4.2 12/30/2022    Lab Results  Component Value Date   MG 2.2 12/30/2022   Lab Results  Component Value Date   VD25OH 86.5 11/11/2023   VD25OH 63.8 05/13/2023   VD25OH 64 03/30/2023    No results found for: PREALBUMIN    Latest Ref Rng & Units 11/11/2023    3:42 PM 05/13/2023    2:52 PM 12/30/2022   12:21 PM  CBC EXTENDED  WBC 3.4 - 10.8 x10E3/uL 4.1  4.4  5.3   RBC 3.77 - 5.28 x10E6/uL 4.01  4.23  4.11   Hemoglobin 11.1 - 15.9 g/dL 88.0  87.5  87.8   HCT 34.0 - 46.6 % 36.1  38.1  37.4   Platelets 150 - 450 x10E3/uL  310  266  291   NEUT# 1.4 - 7.0 x10E3/uL 1.9  2.5  3.4   Lymph# 0.7 - 3.1 x10E3/uL 1.3  1.3  1.3      There is no height or weight on file to calculate BMI.  Orders:  Orders Placed This Encounter  Procedures   XR Foot 2 Views Right   XR Ankle 2 Views Right   No orders of the defined types were placed in this encounter.    Procedures: No procedures performed  Clinical Data: No additional findings.  ROS:  All other systems negative, except as noted in the HPI. Review of Systems  Objective: Vital Signs: There were no vitals taken for this visit.  Specialty Comments:  No specialty comments available.  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   shellfish   Anxiety ?   Arthritis    erosive osteoarthritis   Asthma    Atrial fibrillation (HCC)    Cataract    They were removed   Chronically dry eyes    Fatigue    chronic ( and weakness)   GERD (gastroesophageal reflux disease)    Lyme disease    Neuromuscular disorder (HCC)    neuropathy   Neuropathy    legs and feet   Osteoporosis    Sleep apnea 03/2023   Uses continuous positive airway pressure (CPAP) ventilation at home     Family History  Problem Relation Age of Onset   Depression Mother    Other Mother        suicide   Other Father        plane crash   Arthritis Father    Heart disease Sister    Early death Sister    Cancer Brother    Cancer Brother        skin cancer   Depression Daughter    Thyroid  disease Daughter    Migraines Daughter    Colon cancer Neg Hx     Past Surgical History:  Procedure Laterality Date   ABDOMINAL HYSTERECTOMY     BREAST SURGERY Left    neg tumor   COLONOSCOPY     COLONOSCOPY WITH PROPOFOL  N/A 07/07/2022   Procedure: COLONOSCOPY WITH PROPOFOL ;  Surgeon: Cindie Carlin POUR, DO;  Location: [facility];  Service: Endoscopy;  Laterality: N/A;  1:15pm, ASA 2, per office pt can't move up   ESOPHAGOGASTRODUODENOSCOPY     EYE SURGERY  ?   torn retina, cataracts   POLYPECTOMY  07/07/2022   Procedure: POLYPECTOMY;  Surgeon: Cindie Carlin POUR, DO;  Location: [facility];  Service: Endoscopy;;    RIGHT AND LEFT HEART CATH     TUBAL LIGATION     Social History   Occupational History   Occupation: retired  Tobacco Use   Smoking status: Never    Passive exposure: Never   Smokeless tobacco: Never  Vaping Use   Vaping status: Never Used  Substance and  Sexual Activity   Alcohol use: Yes    Comment: Occasionally drink small amounts   Drug use: Never   Sexual activity: Not Currently

## 2024-08-17 ENCOUNTER — Telehealth: Payer: Self-pay | Admitting: Orthopedic Surgery

## 2024-08-17 DIAGNOSIS — G4733 Obstructive sleep apnea (adult) (pediatric): Secondary | ICD-10-CM | POA: Diagnosis not present

## 2024-08-17 NOTE — Telephone Encounter (Signed)
 Pt called stating she need pain medication. Pt still in pain. [facility] send meds to [facility]. Pt phone number is [phone].

## 2024-08-17 NOTE — Telephone Encounter (Signed)
 Please see message below. Pt was in office for eval of nondisplaced right fib fx and nondisplaced right 5th toe fx and is asking for rx for pain. Please advise.

## 2024-08-18 ENCOUNTER — Other Ambulatory Visit: Payer: Self-pay | Admitting: Physician Assistant

## 2024-08-18 DIAGNOSIS — M25571 Pain in right ankle and joints of right foot: Secondary | ICD-10-CM

## 2024-08-18 MED ORDER — HYDROCODONE-ACETAMINOPHEN 5-325 MG PO TABS: 1.0000 | ORAL_TABLET | Freq: Four times a day (QID) | ORAL | 0 refills | Status: DC | PRN

## 2024-08-19 DIAGNOSIS — G4733 Obstructive sleep apnea (adult) (pediatric): Secondary | ICD-10-CM | POA: Diagnosis not present

## 2024-09-04 ENCOUNTER — Encounter: Payer: Self-pay | Admitting: Radiology

## 2024-09-12 ENCOUNTER — Ambulatory Visit

## 2024-09-12 ENCOUNTER — Encounter: Payer: Self-pay | Admitting: Family Medicine

## 2024-09-12 ENCOUNTER — Ambulatory Visit (INDEPENDENT_AMBULATORY_CARE_PROVIDER_SITE_OTHER): Payer: Self-pay | Admitting: Family Medicine

## 2024-09-12 VITALS — BP 105/61 | HR 76 | Temp 97.9°F | Ht 66.0 in | Wt 130.4 lb

## 2024-09-12 DIAGNOSIS — W19XXXS Unspecified fall, sequela: Secondary | ICD-10-CM

## 2024-09-12 DIAGNOSIS — S92919D Unspecified fracture of unspecified toe(s), subsequent encounter for fracture with routine healing: Secondary | ICD-10-CM | POA: Diagnosis not present

## 2024-09-12 DIAGNOSIS — S82831D Other fracture of upper and lower end of right fibula, subsequent encounter for closed fracture with routine healing: Secondary | ICD-10-CM | POA: Diagnosis not present

## 2024-09-12 DIAGNOSIS — W19XXXD Unspecified fall, subsequent encounter: Secondary | ICD-10-CM | POA: Diagnosis not present

## 2024-09-12 DIAGNOSIS — S82831A Other fracture of upper and lower end of right fibula, initial encounter for closed fracture: Secondary | ICD-10-CM

## 2024-09-12 DIAGNOSIS — M25562 Pain in left knee: Secondary | ICD-10-CM

## 2024-09-12 DIAGNOSIS — S92919A Unspecified fracture of unspecified toe(s), initial encounter for closed fracture: Secondary | ICD-10-CM

## 2024-09-12 NOTE — Progress Notes (Signed)
 Subjective:  Patient ID: Kristin Wallace, female    DOB: Jan 19, 1956, 68 y.o.   MRN: [id number]  Patient Care Team: Severa Rock HERO, FNP as PCP - General (Family Medicine) Octavia Charleston, MD as Referring Physician (Ophthalmology)   Chief Complaint:  Foot Injury   HPI: Kristin Wallace is a 68 y.o. female presenting on 09/12/2024 for Foot Injury   Kristin Wallace is a 68 year old female who presents with ankle and foot fractures after a fall.  Lower extremity fractures and immobilization - Sustained ankle and two foot fractures after falling off a few steps while cleaning up after her dog - Twisted ankle after stepping on the edge of the last step, resulting in a fall onto the ankle - Currently wearing a prescribed boot for immobilization - Boot is cumbersome and causes discomfort by the end of the day - Wears the boot as prescribed except when sitting with feet elevated, in bed, or showering - Emphasizes consistent use of the boot compared to previous experiences  Knee pain and swelling - Significant knee pain and swelling for approximately two weeks - Severe pain, especially with bending or squatting - Sensation of knee 'locking up,' making it difficult to stand - No prior knee symptoms before two weeks ago - No history of knee injury during the fall - History of arthritis, suspected to contribute to current symptoms - Intermittent use of ice for symptomatic relief - No fever or other systemic symptoms  Functional limitations and impact on daily activities - Unable to walk her dog or engage in gardening since injury, both previously daily activities - Cautious to avoid worsening her condition - Eager to resume normal activities once medically cleared  Sleep disturbance - Difficulty maintaining sleep despite use of trazodone  - Wakes up after a couple of hours and has trouble returning to sleep          Relevant past medical, surgical, family, and social history  reviewed and updated as indicated.  Allergies and medications reviewed and updated. Data reviewed: Chart in Epic.   Past Medical History:  Diagnosis Date   Allergy 1970?   shellfish   Anxiety ?   Arthritis    erosive osteoarthritis   Asthma    Atrial fibrillation (HCC)    Cataract    They were removed   Chronically dry eyes    Fatigue    chronic ( and weakness)   GERD (gastroesophageal reflux disease)    Lyme disease    Neuromuscular disorder (HCC)    neuropathy   Neuropathy    legs and feet   Osteoporosis    Sleep apnea 03/2023   Uses continuous positive airway pressure (CPAP) ventilation at home     Past Surgical History:  Procedure Laterality Date   ABDOMINAL HYSTERECTOMY     BREAST SURGERY Left    neg tumor   COLONOSCOPY     COLONOSCOPY WITH PROPOFOL  N/A 07/07/2022   Procedure: COLONOSCOPY WITH PROPOFOL ;  Surgeon: Cindie Carlin POUR, DO;  [facility];  Service: Endoscopy;  Laterality: N/A;  1:15pm, ASA 2, per office pt can't move up   ESOPHAGOGASTRODUODENOSCOPY     EYE SURGERY  ?   torn retina, cataracts   POLYPECTOMY  07/07/2022   Procedure: POLYPECTOMY;  Surgeon: Cindie Carlin POUR, DO;  [facility]: [facility];  Service: Endoscopy;;   RIGHT AND LEFT HEART CATH     TUBAL LIGATION      Social History  Socioeconomic History   Marital status: Divorced    Spouse name: Not on file   Number of children: 3   Years of education: Not on file   Highest education level: GED or equivalent  Occupational History   Occupation: retired  Tobacco Use   Smoking status: Never    Passive exposure: Never   Smokeless tobacco: Never  Vaping Use   Vaping status: Never Used  Substance and Sexual Activity   Alcohol use: Yes    Comment: Occasionally drink small amounts   Drug use: Never   Sexual activity: Not Currently  Other Topics Concern   Not on file  Social History Narrative   Moved here from [place]  this year 07/27/2021.   Daughter lives with her    Right handed   Social Drivers of Health   Financial Resource Strain: Medium Risk (09/10/2024)   Overall Financial Resource Strain (CARDIA)    Difficulty of Paying Living Expenses: Somewhat hard  Food Insecurity: Food Insecurity Present (09/10/2024)   Hunger Vital Sign    Worried About Running Out of Food in the Last Year: Sometimes true    Ran Out of Food in the Last Year: Sometimes true  Transportation Needs: No Transportation Needs (09/10/2024)   PRAPARE - Administrator, Civil Service (Medical): No    Lack of Transportation (Non-Medical): No  Physical Activity: Sufficiently Active (09/10/2024)   Exercise Vital Sign    Days of Exercise per Week: 6 days    Minutes of Exercise per Session: 30 min  Stress: No Stress Concern Present (09/10/2024)   [employer] of Occupational Health - Occupational Stress Questionnaire    Feeling of Stress: Not at all  Social Connections: Moderately Integrated (09/10/2024)   Social Connection and Isolation Panel    Frequency of Communication with Friends and Family: More than three times a week    Frequency of Social Gatherings with Friends and Family: Twice a week    Attends Religious Services: More than 4 times per year    Active Member of [employer] or Organizations: Yes    Attends Engineer, Structural: More than 4 times per year    Marital Status: Divorced  Intimate Partner Violence: Not At Risk (12/09/2023)   Humiliation, Afraid, Rape, and Kick questionnaire    Fear of Current or Ex-Partner: No    Emotionally Abused: No    Physically Abused: No    Sexually Abused: No    Outpatient Encounter Medications as of 09/12/2024  Medication Sig   albuterol  (VENTOLIN  HFA) 108 (90 Base) MCG/ACT inhaler Inhale 2 puffs into the lungs every 6 (six) hours as needed for wheezing or shortness of breath.   Ascorbic Acid (VITAMIN C) 1000 MG tablet Take 1,000 mg by mouth in the morning.   Calcium  Carb-Cholecalciferol (CALCIUM  600+D3 PO) Take 1  tablet by mouth every morning.   celecoxib  (CELEBREX ) 100 MG capsule Take 1 capsule (100 mg total) by mouth daily.   Cholecalciferol (VITAMIN D3) 125 MCG (5000 UT) CAPS Take 5,000 Units by mouth in the morning.   CINNAMON PO Take 1 Dose by mouth See admin instructions. 1 teaspoonful twice daily with 2 teaspoonful of honey   cycloSPORINE (RESTASIS) 0.05 % ophthalmic emulsion Place 2 drops into both eyes 2 (two) times daily.   Multiple Vitamins-Minerals (MULTIVITAMIN WOMEN 50+) TABS Take 1 tablet by mouth in the morning.   pantoprazole  (PROTONIX ) 40 MG tablet TAKE 1 TABLET BY MOUTH DAILY   pregabalin  (LYRICA ) 75 MG capsule  Take 1 capsule (75 mg total) by mouth 2 (two) times daily.   PSYLLIUM HUSK PO Take 1 Dose by mouth in the morning.   SODIUM FLUORIDE 5000 PPM 1.1 % PSTE Take by mouth.   SYMBICORT  160-4.5 MCG/ACT inhaler USE 2 INHALATIONS BY MOUTH TWICE DAILY   traZODone  (DESYREL ) 50 MG tablet Take 1 tablet (50 mg total) by mouth at bedtime as needed for sleep.   TURMERIC CURCUMIN PO Take 1 capsule by mouth in the morning and at bedtime. W/Ginger Powder   zoledronic  acid (RECLAST ) 5 MG/100ML SOLN injection Inject into the vein.   [DISCONTINUED] diphenoxylate -atropine  (LOMOTIL ) 2.5-0.025 MG tablet Take 2 tablets by mouth 4 (four) times daily as needed for diarrhea or loose stools.   [DISCONTINUED] HYDROcodone-acetaminophen  (NORCO/VICODIN) 5-325 MG tablet Take 1 tablet by mouth every 6 (six) hours as needed for moderate pain (pain score 4-6).   No facility-administered encounter medications on file as of 09/12/2024.    Allergies  Allergen Reactions   Other Shortness Of Breath    Dairy   Shellfish Allergy Other (See Comments) and Nausea And Vomiting    Vomiting    Pertinent ROS per HPI, otherwise unremarkable      Objective:  BP 105/61   Pulse 76   Temp 97.9 F (36.6 C)   Ht 5' 6 (1.676 m)   Wt 130 lb 6.4 oz (59.1 kg)   SpO2 97%   BMI 21.05 kg/m    Wt Readings from Last 3  Encounters:  09/12/24 130 lb 6.4 oz (59.1 kg)  06/13/24 126 lb 6.4 oz (57.3 kg)  05/26/24 129 lb (58.5 kg)    Physical Exam Vitals and nursing note reviewed.  Constitutional:      General: She is not in acute distress.    Appearance: Normal appearance. She is normal weight. She is not ill-appearing, toxic-appearing or diaphoretic.  HENT:     Head: Normocephalic and atraumatic.     Nose: Nose normal.     Mouth/Throat:     Mouth: Mucous membranes are moist.  Eyes:     Conjunctiva/sclera: Conjunctivae normal.     Pupils: Pupils are equal, round, and reactive to light.  Cardiovascular:     Rate and Rhythm: Normal rate and regular rhythm.     Heart sounds: Normal heart sounds.  Pulmonary:     Effort: Pulmonary effort is normal.     Breath sounds: Normal breath sounds.  Musculoskeletal:     Cervical back: Neck supple.     Left upper leg: Normal.     Left knee: Swelling present. No deformity. Normal range of motion. Tenderness present over the medial joint line.     Left lower leg: Normal.     Comments: RLE: Walking boot  Skin:    General: Skin is warm and dry.     Capillary Refill: Capillary refill takes less than 2 seconds.  Neurological:     General: No focal deficit present.     Mental Status: She is alert and oriented to person, place, and time.     Gait: Gait abnormal (wlaking boot on right lower extrimity, using cane).     Results for orders placed or performed in visit on 01/03/24  Pulmonary function test   Collection Time: 01/03/24  2:09 PM  Result Value Ref Range   FVC-Pre 2.53 L   FVC-%Pred-Pre 78 %   FVC-Post 2.61 L   FVC-%Pred-Post 80 %   FVC-%Change-Post 3 %   FEV1-Pre 1.96 L  FEV1-%Pred-Pre 79 %   FEV1-Post 2.07 L   FEV1-%Pred-Post 83 %   FEV1-%Change-Post 5 %   FEV6-Pre 2.52 L   FEV6-%Pred-Pre 81 %   FEV6-Post 2.61 L   FEV6-%Pred-Post 84 %   FEV6-%Change-Post 3 %   Pre FEV1/FVC ratio 77 %   FEV1FVC-%Pred-Pre 101 %   Post FEV1/FVC ratio 79 %    FEV1FVC-%Change-Post 2 %   Pre FEV6/FVC Ratio 100 %   FEV6FVC-%Pred-Pre 104 %   Post FEV6/FVC ratio 100 %   FEV6FVC-%Pred-Post 104 %   FEF 25-75 Pre 1.62 L/sec   FEF2575-%Pred-Pre 79 %   FEF 25-75 Post 1.99 L/sec   FEF2575-%Pred-Post 97 %   FEF2575-%Change-Post 23 %       Pertinent labs & imaging results that were available during my care of the patient were reviewed by me and considered in my medical decision making.  Assessment & Plan:  Gracious Renken was seen today for foot injury.  Diagnoses and all orders for this visit:  Nondisplaced fracture of distal end of right fibula Fracture of proximal phalanx of toe Followed by ortho   Acute pain of left knee -     DG Knee 1-2 Views Left  Fall, sequela No recurrent falls     Fracture of right ankle and foot, initial encounter Fracture of the right ankle and two fractures in the right foot following a fall. She is compliant with wearing a boot and reports significant discomfort by the end of the day. She is taking extra strength acetaminophen  at night. Awaiting further evaluation by Dr. Harden for potential boot removal. - Continue wearing the boot as instructed. - Continue acetaminophen  for pain management. - Follow up with Dr. Harden for further evaluation and potential boot removal.  Pain in left knee Increased pain and swelling in the left knee, likely exacerbated by altered weight distribution due to the right foot fracture. Pain is localized to the medial side of the knee, with difficulty bending and locking sensation. Differential includes arthritis and potential injury from the fall. She is taking celecoxib  and pregabalin  for pain management. - Ordered x-ray of the left knee to assess for injury. - Continue celecoxib  and pregabalin  for pain management. - Use ice for symptomatic relief as needed.  - Ace wrap applied in office.          Continue all other maintenance medications.  Follow up plan: Return if symptoms  worsen or fail to improve.   Continue healthy lifestyle choices, including diet (rich in fruits, vegetables, and lean proteins, and low in salt and simple carbohydrates) and exercise (at least 30 minutes of moderate physical activity daily).  Educational handout given for knee pain  The above assessment and management plan was discussed with the patient. The patient verbalized understanding of and has agreed to the management plan. Patient is aware to call the clinic if they develop any new symptoms or if symptoms persist or worsen. Patient is aware when to return to the clinic for a follow-up visit. Patient educated on when it is appropriate to go to the emergency department.   Rosaline Bruns, FNP-C [facility] [phone]

## 2024-09-14 ENCOUNTER — Ambulatory Visit: Payer: Self-pay | Admitting: Family Medicine

## 2024-09-14 ENCOUNTER — Encounter: Payer: Self-pay | Admitting: *Deleted

## 2024-09-18 ENCOUNTER — Ambulatory Visit: Admitting: Orthopedic Surgery

## 2024-09-18 ENCOUNTER — Encounter: Payer: Self-pay | Admitting: Orthopedic Surgery

## 2024-09-18 ENCOUNTER — Other Ambulatory Visit (INDEPENDENT_AMBULATORY_CARE_PROVIDER_SITE_OTHER)

## 2024-09-18 ENCOUNTER — Other Ambulatory Visit

## 2024-09-18 DIAGNOSIS — M25571 Pain in right ankle and joints of right foot: Secondary | ICD-10-CM

## 2024-09-18 DIAGNOSIS — M79671 Pain in right foot: Secondary | ICD-10-CM | POA: Diagnosis not present

## 2024-09-18 NOTE — Progress Notes (Signed)
 Office Visit Note   Patient: Kristin Wallace           Date of Birth: 28-Aug-1956           MRN: [id number] Visit Date: 09/18/2024              Requested by: Severa Rock HERO, FNP [address] [facility] [place],  [place] [address] PCP: Severa Rock HERO, FNP  Chief Complaint  Patient presents with   Right Foot - Follow-up   Right Ankle - Follow-up      HPI: Discussed the use of AI scribe software for clinical note transcription with the patient, who gave verbal consent to proceed.  History of Present Illness Kristin Wallace is a 68 year old female who presents for follow-up of a right ankle fracture.  Over the last five days, her foot has felt great, but last night she experienced some mild pain described as 'little tiny sharp zips of pain' in the right foot. This pain has been intermittent today as well. The pain is not severe and is much improved compared to when she initially presented with the fracture.  The right ankle fracture occurred approximately five weeks ago. She has been wearing a fracture boot consistently, except when showering, in bed, or when her foot is elevated. She is considering transitioning out of the boot and has brought a shoe to discuss the possibility of wearing it. She is cautious about the type of shoe to wear, considering support and comfort.  Radiographs of the right ankle show a stable healing Weber B fibular fracture that is nondisplaced with a congruent mortise and no shortening of the fibula. Radiographs of the right foot show a stable healing proximal phalanx fracture of the fifth toe without displacement or angulation.     Assessment & Plan: Visit Diagnoses:  1. Pain in right foot   2. Pain in right ankle and joints of right foot     Plan: Assessment and Plan Assessment & Plan Healing right fibular (Weber B) fracture The right fibular fracture is healing well, five weeks post-injury. Radiographs show a stable, nondisplaced fracture with a  congruent mortise and no fibular shortening. Significant symptom improvement noted, with occasional sharp pains likely from scar tissue. No tenderness on palpation, perfect alignment. - Wean out of fracture boot as comfortable. - Walk without boot at home using counters for support. - If comfortable, walk outside. - Follow up in office as needed.  Healing fracture of proximal phalanx of right fifth toe The fracture of the proximal phalanx of the right fifth toe is healing well with no displacement or angulation on radiographs. Significant symptom improvement noted, with occasional sharp pains likely from scar tissue. Perfect alignment. - Wean out of fracture boot as comfortable. - Walk without boot at home using counters for support. - If comfortable, walk outside. - Follow up in office as needed.      Follow-Up Instructions: No follow-ups on file.   Ortho Exam  Patient is alert, oriented, no adenopathy, well-dressed, normal affect, normal respiratory effort. Physical Exam MUSCULOSKELETAL: No pain in ankle or foot with fracture boot. Fracture not tender to palpation.      Imaging: No results found. No images are attached to the encounter.  Labs: Lab Results  Component Value Date   ESRSEDRATE 6 09/27/2023   ESRSEDRATE 2 03/30/2023   ESRSEDRATE 2 12/23/2021   CRP <3.0 09/27/2023   CRP 0.7 09/29/2022   CRP 0.5 12/23/2021  Lab Results  Component Value Date   ALBUMIN 4.5 11/11/2023   ALBUMIN 4.3 05/13/2023   ALBUMIN 4.2 12/30/2022    Lab Results  Component Value Date   MG 2.2 12/30/2022   Lab Results  Component Value Date   VD25OH 86.5 11/11/2023   VD25OH 63.8 05/13/2023   VD25OH 64 03/30/2023    No results found for: PREALBUMIN    Latest Ref Rng & Units 11/11/2023    3:42 PM 05/13/2023    2:52 PM 12/30/2022   12:21 PM  CBC EXTENDED  WBC 3.4 - 10.8 x10E3/uL 4.1  4.4  5.3   RBC 3.77 - 5.28 x10E6/uL 4.01  4.23  4.11   Hemoglobin 11.1 - 15.9 g/dL 88.0   87.5  87.8   HCT 34.0 - 46.6 % 36.1  38.1  37.4   Platelets 150 - 450 x10E3/uL 310  266  291   NEUT# 1.4 - 7.0 x10E3/uL 1.9  2.5  3.4   Lymph# 0.7 - 3.1 x10E3/uL 1.3  1.3  1.3      There is no height or weight on file to calculate BMI.  Orders:  Orders Placed This Encounter  Procedures   XR Foot 2 Views Right   XR Ankle Complete Right   No orders of the defined types were placed in this encounter.    Procedures: No procedures performed  Clinical Data: No additional findings.  ROS:  All other systems negative, except as noted in the HPI. Review of Systems  Objective: Vital Signs: There were no vitals taken for this visit.  Specialty Comments:  No specialty comments available.  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   shellfish   Anxiety ?   Arthritis    erosive osteoarthritis   Asthma    Atrial fibrillation (HCC)    Cataract    They were removed   Chronically dry eyes    Fatigue    chronic ( and weakness)   GERD (gastroesophageal reflux disease)    Lyme disease    Neuromuscular disorder (HCC)    neuropathy   Neuropathy    legs and feet   Osteoporosis    Sleep apnea 03/2023   Uses continuous positive airway pressure (CPAP) ventilation at home     Family History  Problem Relation Age of Onset   Depression Mother    Other Mother        suicide   Other Father        plane crash   Arthritis Father    Heart disease Sister     Early death Sister    Cancer Brother    Cancer Brother        skin cancer   Depression Daughter    Thyroid  disease Daughter    Migraines Daughter    Colon cancer Neg Hx     Past Surgical History:  Procedure Laterality Date   ABDOMINAL HYSTERECTOMY     BREAST SURGERY Left    neg tumor  COLONOSCOPY     COLONOSCOPY WITH PROPOFOL  N/A 07/07/2022   Procedure: COLONOSCOPY WITH PROPOFOL ;  Surgeon: Cindie Carlin POUR, DO;  [facility]: [facility];  Service: Endoscopy;  Laterality: N/A;  1:15pm, ASA 2, per office pt can't move up   ESOPHAGOGASTRODUODENOSCOPY     EYE SURGERY  ?   torn retina, cataracts   POLYPECTOMY  07/07/2022   Procedure: POLYPECTOMY;  Surgeon: Cindie Carlin POUR, DO;  [facility];  Service: Endoscopy;;   RIGHT AND LEFT HEART CATH     TUBAL LIGATION     Social History   Occupational History   Occupation: retired  Tobacco Use   Smoking status: Never    Passive exposure: Never   Smokeless tobacco: Never  Vaping Use   Vaping status: Never Used  Substance and Sexual Activity   Alcohol use: Yes    Comment: Occasionally drink small amounts   Drug use: Never   Sexual activity: Not Currently

## 2024-10-13 ENCOUNTER — Ambulatory Visit: Admitting: Family Medicine

## 2024-10-13 ENCOUNTER — Encounter: Payer: Self-pay | Admitting: Family Medicine

## 2024-10-13 VITALS — BP 130/76 | HR 85 | Temp 97.8°F | Ht 66.0 in | Wt 129.8 lb

## 2024-10-13 DIAGNOSIS — G8929 Other chronic pain: Secondary | ICD-10-CM | POA: Diagnosis not present

## 2024-10-13 DIAGNOSIS — M25562 Pain in left knee: Secondary | ICD-10-CM | POA: Diagnosis not present

## 2024-10-13 MED ORDER — METHYLPREDNISOLONE ACETATE 80 MG/ML IJ SUSP
80.0000 mg | Freq: Once | INTRAMUSCULAR | Status: AC
  Administered 2024-10-13: 60 mg via INTRAMUSCULAR

## 2024-10-13 NOTE — Progress Notes (Signed)
 Subjective:  Patient ID: Kristin Wallace, female    DOB: 12-29-1955, 68 y.o.   MRN: [id number]  Patient Care Team: Severa Rock HERO, FNP as PCP - General (Family Medicine) Octavia Charleston, MD as Referring Physician (Ophthalmology)   Chief Complaint:  Knee Pain (Left x 3-4 weeks after a fall. States that she had chronic knee pain before but got better until the fall. )   HPI: Kristin Wallace is a 68 y.o. female presenting on 10/13/2024 for Knee Pain (Left x 3-4 weeks after a fall. States that she had chronic knee pain before but got better until the fall. )   Kristin Wallace Kristin Wallace is a 68 year old female who presents with left knee pain and swelling.  She experiences significant pain and swelling in her left knee, which has been problematic since living in [place] , where she had to navigate two flights of stairs. After moving to a single-level home, the symptoms initially improved but have since worsened.  The pain is described as sharp and localized to the medial side of the left knee, sometimes feeling like 'somebody's drilling into my knee.' It is severe enough to interfere with daily activities, including walking and sitting, and is relieved only by elevating and icing the knee. She ices the knee between three to six times a day for 15 to 30 minutes each session.  She uses extra strength ibuprofen at night to manage the pain and applies a menthol and camphor muscle rub, which provides some relief. She has also tried chiropractic treatments, including sonogram therapy and a TENS unit, which offered temporary relief. An x-ray of the knee was performed last month.  She has a history of Lyme disease treated with prednisone , which caused hallucinations, making her cautious about using oral steroids. She wants to return to her active lifestyle, including outdoor activities like raking leaves.  No prior physical therapy for the knee.          Relevant past medical, surgical, family, and  social history reviewed and updated as indicated.  Allergies and medications reviewed and updated. Data reviewed: Chart in Epic.   Past Medical History:  Diagnosis Date   Allergy 1970?   shellfish   Anxiety ?   Arthritis    erosive osteoarthritis   Asthma    Atrial fibrillation (HCC)    Cataract    They were removed   Chronically dry eyes    Fatigue    chronic ( and weakness)   GERD (gastroesophageal reflux disease)    Lyme disease    Neuromuscular disorder (HCC)    neuropathy   Neuropathy    legs and feet   Osteoporosis    Sleep apnea 03/2023   Uses continuous positive airway pressure (CPAP) ventilation at home     Past Surgical History:  Procedure Laterality Date   ABDOMINAL HYSTERECTOMY     BREAST SURGERY Left    neg tumor   COLONOSCOPY     COLONOSCOPY WITH PROPOFOL  N/A 07/07/2022   Procedure: COLONOSCOPY WITH PROPOFOL ;  Surgeon: Cindie Carlin POUR, DO;  Location: [facility];  Service: Endoscopy;  Laterality: N/A;  1:15pm, ASA 2, per office pt can't move up   ESOPHAGOGASTRODUODENOSCOPY     EYE SURGERY  ?   torn retina, cataracts   POLYPECTOMY  07/07/2022   Procedure: POLYPECTOMY;  Surgeon: Cindie Carlin POUR, DO;  Location: [facility];  Service: Endoscopy;;   RIGHT AND LEFT HEART CATH     TUBAL LIGATION  Social History   Socioeconomic History   Marital status: Divorced    Spouse name: Not on file   Number of children: 3   Years of education: Not on file   Highest education level: GED or equivalent  Occupational History   Occupation: retired  Tobacco Use   Smoking status: Never    Passive exposure: Never   Smokeless tobacco: Never  Vaping Use   Vaping status: Never Used  Substance and Sexual Activity   Alcohol use: Yes    Comment: Occasionally drink small amounts   Drug use: Never   Sexual activity: Not Currently  Other Topics Concern   Not on file  Social History Narrative   Moved here from [place]  this year 07/27/2021.   Daughter  lives with her   Right handed   Social Drivers of Health   Tobacco Use: Low Risk (10/13/2024)   Patient History    Smoking Tobacco Use: Never    Smokeless Tobacco Use: Never    Passive Exposure: Never  Financial Resource Strain: Medium Risk (09/10/2024)   Overall Financial Resource Strain (CARDIA)    Difficulty of Paying Living Expenses: Somewhat hard  Food Insecurity: Food Insecurity Present (09/10/2024)   Epic    Worried About Programme Researcher, Broadcasting/film/video in the Last Year: Sometimes true    Ran Out of Food in the Last Year: Sometimes true  Transportation Needs: No Transportation Needs (09/10/2024)   Epic    Lack of Transportation (Medical): No    Lack of Transportation (Non-Medical): No  Physical Activity: Sufficiently Active (09/10/2024)   Exercise Vital Sign    Days of Exercise per Week: 6 days    Minutes of Exercise per Session: 30 min  Stress: No Stress Concern Present (09/10/2024)   [employer] of Occupational Health - Occupational Stress Questionnaire    Feeling of Stress: Not at all  Social Connections: Moderately Integrated (09/10/2024)   Social Connection and Isolation Panel    Frequency of Communication with Friends and Family: More than three times a week    Frequency of Social Gatherings with Friends and Family: Twice a week    Attends Religious Services: More than 4 times per year    Active Member of [employer] or Organizations: Yes    Attends Banker Meetings: More than 4 times per year    Marital Status: Divorced  Intimate Partner Violence: Not At Risk (12/09/2023)   Humiliation, Afraid, Rape, and Kick questionnaire    Fear of Current or Ex-Partner: No    Emotionally Abused: No    Physically Abused: No    Sexually Abused: No  Depression (PHQ2-9): Medium Risk (09/12/2024)   Depression (PHQ2-9)    PHQ-2 Score: 9  Alcohol Screen: Low Risk (05/07/2024)   Alcohol Screen    Last Alcohol Screening Score (AUDIT): 1  Housing: Low Risk (09/10/2024)   Epic    Unable  to Pay for Housing in the Last Year: No    Number of Times Moved in the Last Year: 0    Homeless in the Last Year: No  Utilities: Not At Risk (12/09/2023)   [facility] Utilities    Threatened with loss of utilities: No  Health Literacy: Adequate Health Literacy (12/09/2023)   B1300 Health Literacy    Frequency of need for help with medical instructions: Never    Outpatient Encounter Medications as of 10/13/2024  Medication Sig   albuterol  (VENTOLIN  HFA) 108 (90 Base) MCG/ACT inhaler Inhale 2 puffs into the lungs every 6 (  six) hours as needed for wheezing or shortness of breath.   Ascorbic Acid (VITAMIN C) 1000 MG tablet Take 1,000 mg by mouth in the morning.   Calcium  Carb-Cholecalciferol (CALCIUM  600+D3 PO) Take 1 tablet by mouth every morning.   celecoxib  (CELEBREX ) 100 MG capsule Take 1 capsule (100 mg total) by mouth daily.   Cholecalciferol (VITAMIN D3) 125 MCG (5000 UT) CAPS Take 5,000 Units by mouth in the morning.   CINNAMON PO Take 1 Dose by mouth See admin instructions. 1 teaspoonful twice daily with 2 teaspoonful of honey   cycloSPORINE (RESTASIS) 0.05 % ophthalmic emulsion Place 2 drops into both eyes 2 (two) times daily.   Multiple Vitamins-Minerals (MULTIVITAMIN WOMEN 50+) TABS Take 1 tablet by mouth in the morning.   pantoprazole  (PROTONIX ) 40 MG tablet TAKE 1 TABLET BY MOUTH DAILY   pregabalin  (LYRICA ) 75 MG capsule Take 1 capsule (75 mg total) by mouth 2 (two) times daily.   PSYLLIUM HUSK PO Take 1 Dose by mouth in the morning.   SODIUM FLUORIDE 5000 PPM 1.1 % PSTE Take by mouth.   SYMBICORT  160-4.5 MCG/ACT inhaler USE 2 INHALATIONS BY MOUTH TWICE DAILY   traZODone  (DESYREL ) 50 MG tablet Take 1 tablet (50 mg total) by mouth at bedtime as needed for sleep.   TURMERIC CURCUMIN PO Take 1 capsule by mouth in the morning and at bedtime. W/Ginger Powder   zoledronic  acid (RECLAST ) 5 MG/100ML SOLN injection Inject into the vein.   [EXPIRED] methylPREDNISolone  acetate (DEPO-MEDROL )  injection 80 mg    No facility-administered encounter medications on file as of 10/13/2024.    Allergies[1]  Pertinent ROS per HPI, otherwise unremarkable      Objective:  BP 130/76   Pulse 85   Temp 97.8 F (36.6 C)   Ht 5' 6 (1.676 m)   Wt 129 lb 12.8 oz (58.9 kg)   SpO2 100%   BMI 20.95 kg/m    Wt Readings from Last 3 Encounters:  10/13/24 129 lb 12.8 oz (58.9 kg)  09/12/24 130 lb 6.4 oz (59.1 kg)  06/13/24 126 lb 6.4 oz (57.3 kg)    Physical Exam Vitals and nursing note reviewed.  Constitutional:      General: She is not in acute distress.    Appearance: Normal appearance. She is normal weight. She is not ill-appearing, toxic-appearing or diaphoretic.  HENT:     Head: Normocephalic and atraumatic.     Mouth/Throat:     Mouth: Mucous membranes are moist.  Eyes:     Pupils: Pupils are equal, round, and reactive to light.  Cardiovascular:     Rate and Rhythm: Normal rate and regular rhythm.     Pulses: Normal pulses.     Heart sounds: Normal heart sounds.  Pulmonary:     Effort: Pulmonary effort is normal.     Breath sounds: Normal breath sounds.  Musculoskeletal:     Left upper leg: Normal.     Left knee: Swelling and effusion present. Tenderness present over the medial joint line. No LCL laxity, MCL laxity, ACL laxity or PCL laxity.Normal pulse.     Left lower leg: Normal.  Skin:    General: Skin is warm and dry.     Capillary Refill: Capillary refill takes less than 2 seconds.  Neurological:     General: No focal deficit present.     Mental Status: She is alert and oriented to person, place, and time.  Psychiatric:        Mood  and Affect: Mood normal.        Behavior: Behavior normal.        Thought Content: Thought content normal.        Judgment: Judgment normal.    Joint Injection/Arthrocentesis  Date/Time: 10/13/2024 1:08 PM  Performed by: Severa Rock HERO, FNP Authorized by: Severa Rock HERO, FNP  Indications: joint swelling, pain and  diagnostic evaluation  Body area: knee Joint: left knee Local anesthesia used: yes  Anesthesia: Local anesthesia used: yes Local Anesthetic: co-phenylcaine spray  Sedation: Patient sedated: no  Preparation: Patient was prepped and draped in the usual sterile fashion. Needle size: 18 G Ultrasound guidance: no Approach: anterior Aspirate: yellow and clear Aspirate amount: 10 mL Methylprednisolone  amount: 60 mg Lidocaine 2% amount: 3.5 mL Patient tolerance: patient tolerated the procedure well with no immediate complications      Results for orders placed or performed in visit on 01/03/24  Pulmonary function test   Collection Time: 01/03/24  2:09 PM  Result Value Ref Range   FVC-Pre 2.53 L   FVC-%Pred-Pre 78 %   FVC-Post 2.61 L   FVC-%Pred-Post 80 %   FVC-%Change-Post 3 %   FEV1-Pre 1.96 L   FEV1-%Pred-Pre 79 %   FEV1-Post 2.07 L   FEV1-%Pred-Post 83 %   FEV1-%Change-Post 5 %   FEV6-Pre 2.52 L   FEV6-%Pred-Pre 81 %   FEV6-Post 2.61 L   FEV6-%Pred-Post 84 %   FEV6-%Change-Post 3 %   Pre FEV1/FVC ratio 77 %   FEV1FVC-%Pred-Pre 101 %   Post FEV1/FVC ratio 79 %   FEV1FVC-%Change-Post 2 %   Pre FEV6/FVC Ratio 100 %   FEV6FVC-%Pred-Pre 104 %   Post FEV6/FVC ratio 100 %   FEV6FVC-%Pred-Post 104 %   FEF 25-75 Pre 1.62 L/sec   FEF2575-%Pred-Pre 79 %   FEF 25-75 Post 1.99 L/sec   FEF2575-%Pred-Post 97 %   FEF2575-%Change-Post 23 %       Pertinent labs & imaging results that were available during my care of the patient were reviewed by me and considered in my medical decision making.  Assessment & Plan:  Shanequia Kendrick was seen today for knee pain.  Diagnoses and all orders for this visit:  Chronic pain of left knee -     methylPREDNISolone  acetate (DEPO-MEDROL ) injection 80 mg -     Ambulatory referral to Physical Therapy -     Joint Injection/Arthrocentesis      Left knee osteoarthritis with pain and effusion Chronic left knee pain with swelling and  effusion, likely due to osteoarthritis. Pain is sharp and throbbing, exacerbated by movement and relieved by elevation and icing. Previous x-ray showed no acute findings but confirmed effusion. Differential includes possible meniscus tear, but no imaging has been done to confirm. Conservative management with NSAIDs has been attempted. She prefers corticosteroid injection due to severe pain impacting daily activities. Discussed potential side effects of oral steroids, including hallucinations, and the systemic absorption of injected steroids. She opted for injection over oral steroids due to past adverse effects with prednisone . - Administered corticosteroid injection into the left knee joint. - Attempted to aspirate fluid from the knee joint during injection. - Recommended continuation of icing and elevation for symptomatic relief. - Discussed potential referral to orthopedics for further evaluation if symptoms persist.          Continue all other maintenance medications.  Follow up plan: Return if symptoms worsen or fail to improve.   Continue healthy lifestyle choices, including diet (rich in  fruits, vegetables, and lean proteins, and low in salt and simple carbohydrates) and exercise (at least 30 minutes of moderate physical activity daily).  Educational handout given for knee pain, knee exercises   The above assessment and management plan was discussed with the patient. The patient verbalized understanding of and has agreed to the management plan. Patient is aware to call the clinic if they develop any new symptoms or if symptoms persist or worsen. Patient is aware when to return to the clinic for a follow-up visit. Patient educated on when it is appropriate to go to the emergency department.   Rosaline Bruns, FNP-C [facility] [phone]     [1]  Allergies Allergen Reactions   Other Shortness Of Breath    Dairy   Shellfish Allergy Other (See Comments)  and Nausea And Vomiting    Vomiting

## 2024-10-20 ENCOUNTER — Ambulatory Visit: Admitting: Family Medicine

## 2024-10-23 ENCOUNTER — Ambulatory Visit: Payer: Self-pay

## 2024-10-23 NOTE — Telephone Encounter (Signed)
 Appt made.

## 2024-10-23 NOTE — Telephone Encounter (Signed)
 FYI Only or Action Required?: FYI only for provider: appointment scheduled on 10/24/2024 at 10:35am with the Doctor of the Day at PCP office.  Patient was last seen in primary care on 10/13/2024 by Severa Rock HERO, FNP.  Called Nurse Triage reporting Knee Pain.  Symptoms began several days ago.  Interventions attempted: Rest, hydration, or home remedies.  Symptoms are: gradually worsening.  Triage Disposition: See PCP When Office is Open (Within 3 Days)  Patient/caregiver understands and will follow disposition?: Yes               Copied from CRM #[id number]. Topic: Clinical - Red Word Triage >> Oct 23, 2024 10:34 AM Graeme ORN wrote: Red Word that prompted transfer to Nurse Triage: Pain in muscle - previously had fluid drained from it - Sharp pains - not swelling - requesting MRI Reason for Disposition  [1] MODERATE pain (e.g., interferes with normal activities, limping) AND [2] present > 3 days  Answer Assessment - Initial Assessment Questions Patient states a few months ago she fell, fracturing her right foot and ankle---she states that is better She states that she was using her left leg more at that time She also states she has had previous issues with her left knee  Patient has a history of osteoarthritis Left knee started swelling and having pain--patient came in 10/13/2024 and had fluid drained off at PCP office Patient states within the past few days--she has been having a sharp pain in the muscle--she states it feels like when she was having fluid drained off This sharp pain is off and on ---states that it lasts less than 5 minutes Patient states that three days ago she fell on some ice while walking a dog---she states she fell on her right side---she isnt sure if this sharp pain started after or before that fall Patient states she did not hit her head and did not lose consciousness. She states her head did not hit anything either. Patient states some slight  swelling just to the outside of her left knee She denies any redness or heat on this knee  Patient hasn't called and gotten set up with physical therapy yet but states she needs to do that Patient was inquiring about an MRI on this knee   Patient is advised to call us  back if anything changes or with any further questions/concerns. Patient is advised that if anything worsens to go to the Emergency Room. Patient verbalized understanding.  Protocols used: Knee Pain-A-AH

## 2024-10-24 ENCOUNTER — Ambulatory Visit

## 2024-10-24 ENCOUNTER — Encounter: Payer: Self-pay | Admitting: Nurse Practitioner

## 2024-10-24 ENCOUNTER — Other Ambulatory Visit: Payer: Self-pay | Admitting: Nurse Practitioner

## 2024-10-24 ENCOUNTER — Ambulatory Visit (INDEPENDENT_AMBULATORY_CARE_PROVIDER_SITE_OTHER): Admitting: Nurse Practitioner

## 2024-10-24 VITALS — BP 122/81 | HR 87 | Temp 97.9°F | Ht 66.0 in | Wt 129.0 lb

## 2024-10-24 DIAGNOSIS — G8929 Other chronic pain: Secondary | ICD-10-CM

## 2024-10-24 DIAGNOSIS — M1712 Unilateral primary osteoarthritis, left knee: Secondary | ICD-10-CM | POA: Diagnosis not present

## 2024-10-24 DIAGNOSIS — M25562 Pain in left knee: Secondary | ICD-10-CM

## 2024-10-24 MED ORDER — DICLOFENAC SODIUM 1 % EX GEL: 2.0000 g | Freq: Four times a day (QID) | CUTANEOUS | 0 refills | Status: DC

## 2024-10-24 NOTE — Progress Notes (Signed)
 "    Subjective:  Patient ID: Kristin Wallace, female    DOB: 1956/09/01, 68 y.o.   MRN: [id number]  Patient Care Team: Severa Rock HERO, FNP as PCP - General (Family Medicine) Octavia Charleston, MD as Referring Physician (Ophthalmology)   Chief Complaint:  Knee Pain (Left knee pain and swelling for several weeks )   HPI: Kristin Wallace is a 68 y.o. female presenting on 10/24/2024 for Knee Pain (Left knee pain and swelling for several weeks )   Discussed the use of AI scribe software for clinical note transcription with the patient, who gave verbal consent to proceed.  History of Present Illness Kristin Wallace is a 68 year old female who presents with right knee pain. Saw her PCP Rakes 10/13/2024 for the same issues, had injection and 10 ml of fluid drained.  She experiences sharp pain in her right knee, rated as 8 out of 10, similar to the sensation felt during a previous knee injection. She manages the pain by icing the knee and using a knee sleeve.  Previously, she received a knee injection and had 10 mL of fluid aspirated from her left knee. An x-ray of the left knee performed on September 12, 2024, was unremarkable.  X-ray Left Knee 11/11/202  Left knee radiographs show no evidence of fracture, dislocation, joint effusion, or focal bone abnormality. Soft tissues are unremarkable. No acute abnormality identified.    Relevant past medical, surgical, family, and social history reviewed and updated as indicated.  Allergies and medications reviewed and updated. Data reviewed: Chart in Epic.   Past Medical History:  Diagnosis Date   Allergy 1970?   shellfish   Anxiety ?   Arthritis    erosive osteoarthritis   Asthma    Atrial fibrillation (HCC)    Cataract    They were removed   Chronically dry eyes    Fatigue    chronic ( and weakness)   GERD (gastroesophageal reflux disease)    Lyme disease    Neuromuscular disorder (HCC)    neuropathy   Neuropathy    legs and  feet   Osteoporosis    Sleep apnea 03/2023   Uses continuous positive airway pressure (CPAP) ventilation at home     Past Surgical History:  Procedure Laterality Date   ABDOMINAL HYSTERECTOMY     BREAST SURGERY Left    neg tumor   COLONOSCOPY     COLONOSCOPY WITH PROPOFOL  N/A 07/07/2022   Procedure: COLONOSCOPY WITH PROPOFOL ;  Surgeon: Cindie Carlin POUR, DO;  [facility]: [facility];  Service: Endoscopy;  Laterality: N/A;  1:15pm, ASA 2, per office pt can't move up   ESOPHAGOGASTRODUODENOSCOPY     EYE SURGERY  ?   torn retina, cataracts   POLYPECTOMY  07/07/2022   Procedure: POLYPECTOMY;  Surgeon: Cindie Carlin POUR, DO;  Location: [facility];  Service: Endoscopy;;   RIGHT AND LEFT HEART CATH     TUBAL LIGATION      Social History   Socioeconomic History   Marital status: Divorced    Spouse name: Not on file   Number of children: 3   Years of education: Not on file   Highest education level: GED or equivalent  Occupational History   Occupation: retired  Tobacco Use   Smoking status: Never    Passive exposure: Never   Smokeless tobacco: Never  Vaping Use   Vaping status: Never Used  Substance and Sexual Activity   Alcohol use: Yes  Comment: Occasionally drink small amounts   Drug use: Never   Sexual activity: Not Currently  Other Topics Concern   Not on file  Social History Narrative   Moved here from [place]  this year 07/27/2021.   Daughter lives with her   Right handed   Social Drivers of Health   Tobacco Use: Low Risk (10/24/2024)   Patient History    Smoking Tobacco Use: Never    Smokeless Tobacco Use: Never    Passive Exposure: Never  Financial Resource Strain: Medium Risk (09/10/2024)   Overall Financial Resource Strain (CARDIA)    Difficulty of Paying Living Expenses: Somewhat hard  Food Insecurity: Food Insecurity Present (09/10/2024)   Epic    Worried About Programme Researcher, Broadcasting/film/video in the Last Year: Sometimes true    Ran Out of Food in the Last  Year: Sometimes true  Transportation Needs: No Transportation Needs (09/10/2024)   Epic    Lack of Transportation (Medical): No    Lack of Transportation (Non-Medical): No  Physical Activity: Sufficiently Active (09/10/2024)   Exercise Vital Sign    Days of Exercise per Week: 6 days    Minutes of Exercise per Session: 30 min  Stress: No Stress Concern Present (09/10/2024)   [employer] of Occupational Health - Occupational Stress Questionnaire    Feeling of Stress: Not at all  Social Connections: Moderately Integrated (09/10/2024)   Social Connection and Isolation Panel    Frequency of Communication with Friends and Family: More than three times a week    Frequency of Social Gatherings with Friends and Family: Twice a week    Attends Religious Services: More than 4 times per year    Active Member of [employer] or Organizations: Yes    Attends Banker Meetings: More than 4 times per year    Marital Status: Divorced  Intimate Partner Violence: Not At Risk (12/09/2023)   Humiliation, Afraid, Rape, and Kick questionnaire    Fear of Current or Ex-Partner: No    Emotionally Abused: No    Physically Abused: No    Sexually Abused: No  Depression (PHQ2-9): Medium Risk (09/12/2024)   Depression (PHQ2-9)    PHQ-2 Score: 9  Alcohol Screen: Low Risk (05/07/2024)   Alcohol Screen    Last Alcohol Screening Score (AUDIT): 1  Housing: Low Risk (09/10/2024)   Epic    Unable to Pay for Housing in the Last Year: No    Number of Times Moved in the Last Year: 0    Homeless in the Last Year: No  Utilities: Not At Risk (12/09/2023)   AHC Utilities    Threatened with loss of utilities: No  Health Literacy: Adequate Health Literacy (12/09/2023)   B1300 Health Literacy    Frequency of need for help with medical instructions: Never    Outpatient Encounter Medications as of 10/24/2024  Medication Sig   albuterol  (VENTOLIN  HFA) 108 (90 Base) MCG/ACT inhaler Inhale 2 puffs into the lungs every 6  (six) hours as needed for wheezing or shortness of breath.   Ascorbic Acid (VITAMIN C) 1000 MG tablet Take 1,000 mg by mouth in the morning.   Calcium  Carb-Cholecalciferol (CALCIUM  600+D3 PO) Take 1 tablet by mouth every morning.   celecoxib  (CELEBREX ) 100 MG capsule Take 1 capsule (100 mg total) by mouth daily.   Cholecalciferol (VITAMIN D3) 125 MCG (5000 UT) CAPS Take 5,000 Units by mouth in the morning.   CINNAMON PO Take 1 Dose by mouth See admin instructions. 1 teaspoonful  twice daily with 2 teaspoonful of honey   cycloSPORINE (RESTASIS) 0.05 % ophthalmic emulsion Place 2 drops into both eyes 2 (two) times daily.   diclofenac  Sodium (VOLTAREN ) 1 % GEL Apply 2 g topically 4 (four) times daily.   Multiple Vitamins-Minerals (MULTIVITAMIN WOMEN 50+) TABS Take 1 tablet by mouth in the morning.   pantoprazole  (PROTONIX ) 40 MG tablet TAKE 1 TABLET BY MOUTH DAILY   pregabalin  (LYRICA ) 75 MG capsule Take 1 capsule (75 mg total) by mouth 2 (two) times daily.   PSYLLIUM HUSK PO Take 1 Dose by mouth in the morning.   SODIUM FLUORIDE 5000 PPM 1.1 % PSTE Take by mouth.   SYMBICORT  160-4.5 MCG/ACT inhaler USE 2 INHALATIONS BY MOUTH TWICE DAILY   traZODone  (DESYREL ) 50 MG tablet Take 1 tablet (50 mg total) by mouth at bedtime as needed for sleep.   TURMERIC CURCUMIN PO Take 1 capsule by mouth in the morning and at bedtime. W/Ginger Powder   zoledronic  acid (RECLAST ) 5 MG/100ML SOLN injection Inject into the vein.   No facility-administered encounter medications on file as of 10/24/2024.    Allergies[1]  Pertinent ROS per HPI, otherwise unremarkable      Objective:  BP 122/81   Pulse 87   Temp 97.9 F (36.6 C) (Temporal)   Ht 5' 6 (1.676 m)   Wt 129 lb (58.5 kg)   SpO2 99%   BMI 20.82 kg/m    Wt Readings from Last 3 Encounters:  10/24/24 129 lb (58.5 kg)  10/13/24 129 lb 12.8 oz (58.9 kg)  09/12/24 130 lb 6.4 oz (59.1 kg)    Physical Exam Vitals and nursing note reviewed.   Constitutional:      General: She is not in acute distress. HENT:     Head: Normocephalic and atraumatic.     Nose: Nose normal.  Eyes:     Extraocular Movements: Extraocular movements intact.     Conjunctiva/sclera: Conjunctivae normal.     Pupils: Pupils are equal, round, and reactive to light.  Cardiovascular:     Heart sounds: Normal heart sounds.  Pulmonary:     Effort: Pulmonary effort is normal.     Breath sounds: Normal breath sounds.  Musculoskeletal:     Right knee: Normal.     Left knee: Effusion present. Tenderness present over the medial joint line. No LCL laxity or PCL laxity.Normal pulse.  Skin:    General: Skin is warm and dry.     Findings: No rash.  Neurological:     Mental Status: She is alert and oriented to person, place, and time.  Psychiatric:        Mood and Affect: Mood normal.        Behavior: Behavior normal.        Judgment: Judgment normal.    Physical Exam      Results for orders placed or performed in visit on 01/03/24  Pulmonary function test   Collection Time: 01/03/24  2:09 PM  Result Value Ref Range   FVC-Pre 2.53 L   FVC-%Pred-Pre 78 %   FVC-Post 2.61 L   FVC-%Pred-Post 80 %   FVC-%Change-Post 3 %   FEV1-Pre 1.96 L   FEV1-%Pred-Pre 79 %   FEV1-Post 2.07 L   FEV1-%Pred-Post 83 %   FEV1-%Change-Post 5 %   FEV6-Pre 2.52 L   FEV6-%Pred-Pre 81 %   FEV6-Post 2.61 L   FEV6-%Pred-Post 84 %   FEV6-%Change-Post 3 %   Pre FEV1/FVC ratio 77 %  FEV1FVC-%Pred-Pre 101 %   Post FEV1/FVC ratio 79 %   FEV1FVC-%Change-Post 2 %   Pre FEV6/FVC Ratio 100 %   FEV6FVC-%Pred-Pre 104 %   Post FEV6/FVC ratio 100 %   FEV6FVC-%Pred-Post 104 %   FEF 25-75 Pre 1.62 L/sec   FEF2575-%Pred-Pre 79 %   FEF 25-75 Post 1.99 L/sec   FEF2575-%Pred-Post 97 %   FEF2575-%Change-Post 23 %       Pertinent labs & imaging results that were available during my care of the patient were reviewed by me and considered in my medical decision making.  Assessment  & Plan:  Kristin Wallace was seen today for knee pain.  Diagnoses and all orders for this visit:  Chronic pain of left knee -     Ambulatory referral to Orthopedics -     DG Knee Complete 4 Views Left; Future  Osteoarthritis of left knee, unspecified osteoarthritis type -     Ambulatory referral to Orthopedics -     DG Knee Complete 4 Views Left; Future  Other orders -     diclofenac  Sodium (VOLTAREN ) 1 % GEL; Apply 2 g topically 4 (four) times daily.     Assessment and Plan Kristin Wallace is a 68 year old Caucasian female seen today for left knee pain, no acute distress Assessment & Plan Chronic left knee pain Sharp pain rated 8/10. Recent aspiration of 10 mL fluid. Previous x-ray unremarkable. - Repeat x-ray of left knee; awaiting final report from radiologist - Refer to ortho. - Start Voltaren  gel. - Advise Nervive cream OTC if no improvement. - Continue icing as tolerated      Continue all other maintenance medications.  Follow up plan: No follow-ups on file.   Continue healthy lifestyle choices, including diet (rich in fruits, vegetables, and lean proteins, and low in salt and simple carbohydrates) and exercise (at least 30 minutes of moderate physical activity daily).  Educational handout given for   Clinical References  Chronic Pain, Adult Chronic pain is a type of pain that lasts or keeps coming back for at least 3-6 months. You may have headaches, pain in the abdomen, or pain in other areas of the body. Chronic pain may be related to an illness, injury, or a health condition. Sometimes, the cause of chronic pain is not known. Chronic pain can make it hard for you to do daily activities. If it is not treated, chronic pain can lead to anxiety and depression. Treatment depends on the cause of your pain and how severe it is. You may need to work with a pain specialist to come up with a treatment plan. Many people benefit from two or more types of treatment to control their  pain. Follow these instructions at home: Treatment plan Follow your treatment plan as told by your health care provider. This may include: Gentle, regular exercise. Eating a healthy diet that includes foods such as vegetables, fruits, fish, and lean meats. Mental health therapy (cognitive or behavioral therapy) that changes the way you think or act in response to the pain. This may help improve how you feel. Doing physical therapy exercises to improve movement and strength. Meditation, yoga, acupuncture, or massage therapy. Using the oils from plants in your environment or on your skin (aromatherapy). Other treatments may include: Over-the-counter or prescription medicines. Color, light, or sound therapy. Local electrical stimulation. The electrical pulses help to relieve pain by temporarily stopping the nerve impulses that cause you to feel pain. Injections. These deliver numbing or pain-relieving medicines into the  spine or the area of pain.   Medicines Take over-the-counter and prescription medicines only as told by your health care provider. Ask your health care provider if the medicine prescribed to you: Requires you to avoid driving or using machinery. Can cause constipation. You may need to take these actions to prevent or treat constipation: Drink enough fluid to keep your urine pale yellow. Take over-the-counter or prescription medicines. Eat foods that are high in fiber, such as beans, whole grains, and fresh fruits and vegetables. Limit foods that are high in fat and processed sugars, such as fried or sweet foods. Lifestyle  Ask your health care provider whether you should keep a pain diary. Your health care provider will tell you what information to write in the diary. This may include: When you have pain. What the pain feels like. How medicines and other behaviors or treatments help to reduce the pain. Consider talking with a mental health care provider about how to help  manage chronic pain. Consider joining a chronic pain support group. Try to control or lower your stress levels. Talk with your health care provider about ways to do this. General instructions Learn as much as you can about how to manage your chronic pain. Ask your health care provider if an intensive pain rehabilitation program or a chronic pain specialist would be helpful. Check your pain level as told by your health care provider. Ask your health care provider if you should use a pain scale. Contact a health care provider if: Your pain is not controlled with treatment. You have new pain. You have side effects from pain medicine. You feel weak or you have trouble doing your normal activities. You have trouble sleeping or you develop confusion. You lose feeling or have numbness in your body. You lose control of your bowels or bladder. Get help right away if: Your pain suddenly gets much worse. You develop chest pain. You have trouble breathing or shortness of breath. You faint, or another person sees you faint. These symptoms may be an emergency. Get help right away. Call 911. Do not wait to see if the symptoms will go away. Do not drive yourself to the hospital. Also, get help right away if: You have thoughts about hurting yourself or others. Take one of these steps if you feel like you may hurt yourself or others, or have thoughts about taking your own life: Go to your nearest emergency room. Call 911. Call the National Suicide Prevention Lifeline at 203-002-8411 or 988. This is open 24 hours a day. Text the Crisis Text Line at (425) 344-2940. This information is not intended to replace advice given to you by your health care provider. Make sure you discuss any questions you have with your health care provider. Document Revised: 06/10/2022 Document Reviewed: 05/13/2022 Elsevier Patient Education  2024 Elsevier Inc. Exercises for Chronic Knee Pain Chronic knee pain is pain that lasts  longer than 3 months. For most people with chronic knee pain, exercise and weight loss is an important part of treatment. Your health care provider may want you to focus on: Making the muscles that support your knee stronger. This can take pressure off your knee and reduce pain. Preventing knee stiffness. How far you can move your knee, keeping it there or making it farther. Losing weight (if this applies) to take pressure off your knee, lower your risk for injury, and make it easier for you to exercise. Your provider will help you make an exercise program that fits  your needs and physical abilities. Below are simple, low-impact exercises you can do at home. Ask your provider or physical therapist how often you should do your exercise program and how many times to repeat each exercise. General safety tips  Get your provider's approval before doing any exercises. Start slowly and stop any time you feel pain. Do not exercise if your knee pain is flaring up. Warm up first. Stretching a cold muscle can cause an injury. Do 5-10 minutes of easy movement or light stretching before beginning your exercises. Do 5-10 minutes of low-impact activity (like walking or cycling) before starting strengthening exercises. Contact your provider any time you have pain during or after exercising. Exercise can cause discomfort but should not be painful. It is normal to be a little stiff or sore after exercising. Stretching and range-of-motion exercises Front thigh stretch  Stand up straight and support your body by holding on to a chair or resting one hand on a wall. With your legs straight and close together, bend one knee to lift your heel up toward your butt. Using one hand for support, grab your ankle with your free hand. Pull your foot up closer toward your butt to feel the stretch in front of your thigh. Hold the stretch for 30 seconds. Repeat __________ times. Complete this exercise __________ times a  day. Back thigh stretch  Sit on the floor with your back straight and your legs out straight in front of you. Place the palms of your hands on the floor and slide them toward your feet as you bend at the hip. Try to touch your nose to your knees and feel the stretch in the back of your thighs. Hold for 30 seconds. Repeat __________ times. Complete this exercise __________ times a day. Calf stretch  Stand facing a wall. Place the palms of your hands flat against the wall, arms extended, and lean slightly against the wall. Get into a lunge position with one leg bent at the knee and the other leg stretched out straight behind you. Keep both feet facing the wall and increase the bend in your knee while keeping the heel of the other leg flat on the ground. You should feel the stretch in your calf. Hold for 30 seconds. Repeat __________ times. Complete this exercise __________ times a day. Strengthening exercises Straight leg lift  Lie on your back with one knee bent and the other leg out straight. Slowly lift the straight leg without bending the knee. Lift until your foot is about 12 inches (30 cm) off the floor. Hold for 3-5 seconds and slowly lower your leg. Repeat __________ times. Complete this exercise __________ times a day. Single leg dip  Stand between two chairs and put both hands on the backs of the chairs for support. Extend one leg out straight with your body weight resting on the heel of the standing leg. Slowly bend your standing knee to dip your body to the level that is comfortable for you. Hold for 3-5 seconds. Repeat __________ times. Complete this exercise __________ times a day. Hamstring curls  Stand straight, knees close together, facing the back of a chair. Hold on to the back of a chair with both hands. Keep one leg straight. Bend the other knee while bringing the heel up toward the butt until the knee is bent at a 90-degree angle (right angle). Hold for 3-5  seconds. Repeat __________ times. Complete this exercise __________ times a day. Wall squat  Stand straight with  your back, hips, and head against a wall. Step forward one foot at a time with your back still against the wall. Your feet should be 2 feet (61 cm) from the wall at shoulder width. Keeping your back, hips, and head against the wall, slide down the wall to as close to a sitting position as you can get. Hold for 5-10 seconds, then slowly slide back up. Repeat __________ times. Complete this exercise __________ times a day. Step-ups  Stand in front of a sturdy platform or stool that is about 6 inches (15 cm) high. Slowly step up with your left / right foot, keeping your knee in line with your hip and foot. Do not let your knee bend so far that you cannot see your toes. Hold on to a chair for balance, but do not use it for support. Slowly unlock your knee and lower yourself to the starting position. Repeat __________ times. Complete this exercise __________ times a day. Contact a health care provider if: Your exercises cause pain. Your pain is worse after you exercise. Your pain prevents you from doing your exercises. This information is not intended to replace advice given to you by your health care provider. Make sure you discuss any questions you have with your health care provider. Document Revised: 11/03/2022 Document Reviewed: 11/03/2022 Elsevier Patient Education  2024 Elsevier Inc.  The above assessment and management plan was discussed with the patient. The patient verbalized understanding of and has agreed to the management plan. Patient is aware to call the clinic if they develop any new symptoms or if symptoms persist or worsen. Patient is aware when to return to the clinic for a follow-up visit. Patient educated on when it is appropriate to go to the emergency department.    Loic Hobin [place] Thompson, DNP [facility] [address] [place], [place] [address] [phone]       [1]  Allergies Allergen Reactions   Other Shortness Of Breath    Dairy   Shellfish Allergy Other (See Comments) and Nausea And Vomiting    Vomiting   "

## 2024-11-03 ENCOUNTER — Other Ambulatory Visit (HOSPITAL_COMMUNITY): Payer: Self-pay

## 2024-11-03 DIAGNOSIS — M25562 Pain in left knee: Secondary | ICD-10-CM

## 2024-11-07 ENCOUNTER — Ambulatory Visit (HOSPITAL_COMMUNITY)
Admission: RE | Admit: 2024-11-07 | Discharge: 2024-11-07 | Disposition: A | Discharge status: Home / Self Care | Source: Ambulatory Visit

## 2024-11-07 ENCOUNTER — Ambulatory Visit: Payer: Self-pay | Admitting: Nurse Practitioner

## 2024-11-07 DIAGNOSIS — M25562 Pain in left knee: Secondary | ICD-10-CM | POA: Insufficient documentation

## 2024-11-09 ENCOUNTER — Encounter: Payer: Self-pay | Admitting: Pulmonary Disease

## 2024-11-10 ENCOUNTER — Encounter: Payer: Self-pay | Admitting: Family Medicine

## 2024-11-10 ENCOUNTER — Telehealth: Payer: Self-pay | Admitting: Family Medicine

## 2024-11-10 DIAGNOSIS — I480 Paroxysmal atrial fibrillation: Secondary | ICD-10-CM

## 2024-11-10 NOTE — Telephone Encounter (Signed)
 Referral placed.

## 2024-11-10 NOTE — Telephone Encounter (Signed)
 Okay to place referral.

## 2024-11-10 NOTE — Telephone Encounter (Signed)
 Copied from CRM #[id number]. Topic: Referral - Request for Referral >> Nov 10, 2024  9:03 AM Rosaria BRAVO wrote: Newell from [facility] in [place] called to request a referral for the patient. Her insurance requires a referral for this year. Please advise.   Fax: [phone]

## 2024-11-15 ENCOUNTER — Other Ambulatory Visit: Payer: Self-pay | Admitting: Gastroenterology

## 2024-11-15 ENCOUNTER — Other Ambulatory Visit: Payer: Self-pay | Admitting: Family Medicine

## 2024-11-15 DIAGNOSIS — Z79899 Other long term (current) drug therapy: Secondary | ICD-10-CM

## 2024-11-15 DIAGNOSIS — J4520 Mild intermittent asthma, uncomplicated: Secondary | ICD-10-CM

## 2024-11-15 DIAGNOSIS — G629 Polyneuropathy, unspecified: Secondary | ICD-10-CM

## 2024-11-15 DIAGNOSIS — K2100 Gastro-esophageal reflux disease with esophagitis, without bleeding: Secondary | ICD-10-CM

## 2024-11-15 NOTE — Telephone Encounter (Signed)
 Copied from CRM [id number]. Topic: Clinical - Medication Refill >> Nov 15, 2024 12:31 PM Kevelyn M wrote: Medication: pregabalin  (LYRICA ) 75 MG capsule  Has the patient contacted their pharmacy? Yes (Agent: If no, request that the patient contact the pharmacy for the refill. If patient does not wish to contact the pharmacy document the reason why and proceed with request.) (Agent: If yes, when and what did the pharmacy advise?)  This is the patient's preferred pharmacy:  [facility] - [place], [place] - [address] [place] [address] Phone: [phone] Fax: [phone]  Is this the correct pharmacy for this prescription? Yes If no, delete pharmacy and type the correct one.   Has the prescription been filled recently? No  Is the patient out of the medication? Yes  Has the patient been seen for an appointment in the last year OR does the patient have an upcoming appointment? Yes  Can we respond through MyChart? Yes  Agent: Please be advised that Rx refills may take up to 3 business days. We ask that you follow-up with your pharmacy.

## 2024-11-16 NOTE — Telephone Encounter (Signed)
 Pt needs an appt before any further refills. Please have the pt to call to schedule

## 2024-11-17 NOTE — Telephone Encounter (Signed)
 noted

## 2024-11-17 NOTE — Telephone Encounter (Signed)
 Patient aware she will get it refilled at her appointment on 1/30

## 2024-11-21 ENCOUNTER — Ambulatory Visit

## 2024-12-01 ENCOUNTER — Encounter: Payer: Self-pay | Admitting: Family Medicine

## 2024-12-01 ENCOUNTER — Ambulatory Visit: Admitting: Family Medicine

## 2024-12-01 VITALS — BP 126/55 | HR 78 | Temp 97.0°F | Ht 66.0 in | Wt 129.2 lb

## 2024-12-01 DIAGNOSIS — K219 Gastro-esophageal reflux disease without esophagitis: Secondary | ICD-10-CM

## 2024-12-01 DIAGNOSIS — Z0000 Encounter for general adult medical examination without abnormal findings: Secondary | ICD-10-CM

## 2024-12-01 DIAGNOSIS — G629 Polyneuropathy, unspecified: Secondary | ICD-10-CM | POA: Diagnosis not present

## 2024-12-01 DIAGNOSIS — Z0001 Encounter for general adult medical examination with abnormal findings: Secondary | ICD-10-CM | POA: Diagnosis not present

## 2024-12-01 DIAGNOSIS — J4520 Mild intermittent asthma, uncomplicated: Secondary | ICD-10-CM | POA: Diagnosis not present

## 2024-12-01 DIAGNOSIS — Z13220 Encounter for screening for lipoid disorders: Secondary | ICD-10-CM

## 2024-12-01 DIAGNOSIS — M810 Age-related osteoporosis without current pathological fracture: Secondary | ICD-10-CM

## 2024-12-01 DIAGNOSIS — E559 Vitamin D deficiency, unspecified: Secondary | ICD-10-CM | POA: Diagnosis not present

## 2024-12-01 DIAGNOSIS — Z79899 Other long term (current) drug therapy: Secondary | ICD-10-CM | POA: Diagnosis not present

## 2024-12-01 MED ORDER — PREGABALIN 75 MG PO CAPS: 75.0000 mg | ORAL_CAPSULE | Freq: Two times a day (BID) | ORAL | 5 refills | Status: AC

## 2024-12-02 ENCOUNTER — Ambulatory Visit: Payer: Self-pay | Admitting: Family Medicine

## 2024-12-02 LAB — LIPID PANEL
Chol/HDL Ratio: 2.2 ratio (ref 0.0–4.4)
Cholesterol, Total: 174 mg/dL (ref 100–199)
HDL: 80 mg/dL
LDL Chol Calc (NIH): 83 mg/dL (ref 0–99)
Triglycerides: 57 mg/dL (ref 0–149)
VLDL Cholesterol Cal: 11 mg/dL (ref 5–40)

## 2024-12-02 LAB — CMP14+EGFR
ALT: 16 [IU]/L (ref 0–32)
AST: 19 [IU]/L (ref 0–40)
Albumin: 4.5 g/dL (ref 3.9–4.9)
Alkaline Phosphatase: 52 [IU]/L (ref 49–135)
BUN/Creatinine Ratio: 9 — ABNORMAL LOW (ref 12–28)
BUN: 6 mg/dL — ABNORMAL LOW (ref 8–27)
Bilirubin Total: 0.3 mg/dL (ref 0.0–1.2)
CO2: 23 mmol/L (ref 20–29)
Calcium: 10 mg/dL (ref 8.7–10.3)
Chloride: 102 mmol/L (ref 96–106)
Creatinine, Ser: 0.65 mg/dL (ref 0.57–1.00)
Globulin, Total: 2.5 g/dL (ref 1.5–4.5)
Glucose: 100 mg/dL — ABNORMAL HIGH (ref 70–99)
Potassium: 3.7 mmol/L (ref 3.5–5.2)
Sodium: 140 mmol/L (ref 134–144)
Total Protein: 7 g/dL (ref 6.0–8.5)
eGFR: 95 mL/min/{1.73_m2}

## 2024-12-02 LAB — ANEMIA PROFILE B
Basophils Absolute: 0 10*3/uL (ref 0.0–0.2)
Basos: 0 %
EOS (ABSOLUTE): 0.1 10*3/uL (ref 0.0–0.4)
Eos: 1 %
Ferritin: 49 ng/mL (ref 15–150)
Folate: >20 ng/mL
Hematocrit: 39 % (ref 34.0–46.6)
Hemoglobin: 12.8 g/dL (ref 11.1–15.9)
Immature Grans (Abs): 0 10*3/uL (ref 0.0–0.1)
Immature Granulocytes: 0 %
Iron Saturation: 20 % (ref 15–55)
Iron: 69 ug/dL (ref 27–139)
Lymphocytes Absolute: 1.5 10*3/uL (ref 0.7–3.1)
Lymphs: 29 %
MCH: 29.6 pg (ref 26.6–33.0)
MCHC: 32.8 g/dL (ref 31.5–35.7)
MCV: 90 fL (ref 79–97)
Monocytes Absolute: 0.4 10*3/uL (ref 0.1–0.9)
Monocytes: 7 %
Neutrophils Absolute: 3.1 10*3/uL (ref 1.4–7.0)
Neutrophils: 63 %
Platelets: 310 10*3/uL (ref 150–450)
RBC: 4.32 x10E6/uL (ref 3.77–5.28)
RDW: 13.3 % (ref 11.7–15.4)
Retic Ct Pct: 1 % (ref 0.6–2.6)
Total Iron Binding Capacity: 339 ug/dL (ref 250–450)
UIBC: 270 ug/dL (ref 118–369)
Vitamin B-12: 1744 pg/mL — ABNORMAL HIGH (ref 232–1245)
WBC: 5 10*3/uL (ref 3.4–10.8)

## 2024-12-02 LAB — T4, FREE: Free T4: 1.27 ng/dL (ref 0.82–1.77)

## 2024-12-02 LAB — TSH: TSH: 1.56 u[IU]/mL (ref 0.450–4.500)

## 2024-12-02 LAB — VITAMIN D 25 HYDROXY (VIT D DEFICIENCY, FRACTURES): Vit D, 25-Hydroxy: 69.3 ng/mL (ref 30.0–100.0)

## 2024-12-06 LAB — DRUG SCREEN 10 W/CONF, SERUM
Amphetamines, IA: NEGATIVE ng/mL
Barbiturates, IA: NEGATIVE ug/mL
Benzodiazepines, IA: NEGATIVE ng/mL
Cocaine & Metabolite, IA: NEGATIVE ng/mL
Methadone, IA: NEGATIVE ng/mL
Opiates, IA: NEGATIVE ng/mL
Oxycodones, IA: NEGATIVE ng/mL
Phencyclidine, IA: NEGATIVE ng/mL
Propoxyphene, IA: NEGATIVE ng/mL
THC(Marijuana) Metabolite, IA: NEGATIVE ng/mL

## 2025-01-09 ENCOUNTER — Ambulatory Visit: Admitting: Gastroenterology

## 2025-01-16 ENCOUNTER — Ambulatory Visit

## 2025-05-28 ENCOUNTER — Ambulatory Visit: Admitting: Internal Medicine

## 2025-06-01 ENCOUNTER — Ambulatory Visit: Admitting: Family Medicine
